# Patient Record
Sex: Male | Born: 1955 | Race: White | Hispanic: No | Marital: Married | Smoking: Current every day smoker
Health system: Southern US, Community
[De-identification: ages and names within clinical notes are randomized; demographics above are authoritative.]

## PROBLEM LIST (undated history)

## (undated) DIAGNOSIS — E119 Type 2 diabetes mellitus without complications: Secondary | ICD-10-CM

## (undated) DIAGNOSIS — E079 Disorder of thyroid, unspecified: Secondary | ICD-10-CM

## (undated) DIAGNOSIS — E291 Testicular hypofunction: Secondary | ICD-10-CM

## (undated) DIAGNOSIS — R809 Proteinuria, unspecified: Secondary | ICD-10-CM

## (undated) DIAGNOSIS — I10 Essential (primary) hypertension: Secondary | ICD-10-CM

## (undated) DIAGNOSIS — E785 Hyperlipidemia, unspecified: Secondary | ICD-10-CM

## (undated) DIAGNOSIS — N529 Male erectile dysfunction, unspecified: Secondary | ICD-10-CM

## (undated) DIAGNOSIS — C801 Malignant (primary) neoplasm, unspecified: Secondary | ICD-10-CM

## (undated) DIAGNOSIS — N189 Chronic kidney disease, unspecified: Secondary | ICD-10-CM

## (undated) DIAGNOSIS — E039 Hypothyroidism, unspecified: Secondary | ICD-10-CM

## (undated) DIAGNOSIS — G4730 Sleep apnea, unspecified: Secondary | ICD-10-CM

## (undated) DIAGNOSIS — K219 Gastro-esophageal reflux disease without esophagitis: Secondary | ICD-10-CM

## (undated) HISTORY — DX: Proteinuria, unspecified: R80.9

## (undated) HISTORY — DX: Male erectile dysfunction, unspecified: N52.9

## (undated) HISTORY — DX: Hyperlipidemia, unspecified: E78.5

## (undated) HISTORY — PX: FRACTURE SURGERY: SHX138

## (undated) HISTORY — DX: Disorder of thyroid, unspecified: E07.9

## (undated) HISTORY — DX: Essential (primary) hypertension: I10

## (undated) HISTORY — DX: Testicular hypofunction: E29.1

## (undated) HISTORY — PX: TONSILLECTOMY: SUR1361

## (undated) HISTORY — DX: Type 2 diabetes mellitus without complications: E11.9

## (undated) HISTORY — PX: TONSILLECTOMY AND ADENOIDECTOMY: SHX28

## (undated) HISTORY — PX: HEMORRHOID SURGERY: SHX153

## (undated) HISTORY — DX: Chronic kidney disease, unspecified: N18.9

## (undated) HISTORY — PX: CLAVICLE SURGERY: SHX598

---

## 2006-01-02 ENCOUNTER — Ambulatory Visit: Payer: Self-pay | Admitting: Surgery

## 2006-02-06 ENCOUNTER — Other Ambulatory Visit: Payer: Self-pay

## 2006-02-10 ENCOUNTER — Ambulatory Visit: Payer: Self-pay | Admitting: Surgery

## 2006-02-11 ENCOUNTER — Emergency Department: Payer: Self-pay | Admitting: Emergency Medicine

## 2012-02-07 ENCOUNTER — Ambulatory Visit: Payer: Self-pay | Admitting: Gastroenterology

## 2014-11-08 ENCOUNTER — Emergency Department: Payer: Self-pay | Admitting: Emergency Medicine

## 2014-11-08 LAB — CBC WITH DIFFERENTIAL/PLATELET
Basophil #: 0 10*3/uL (ref 0.0–0.1)
Basophil %: 0.2 %
EOS ABS: 0.1 10*3/uL (ref 0.0–0.7)
Eosinophil %: 0.3 %
HCT: 41.3 % (ref 40.0–52.0)
HGB: 14 g/dL (ref 13.0–18.0)
LYMPHS ABS: 1 10*3/uL (ref 1.0–3.6)
Lymphocyte %: 6.3 %
MCH: 29.1 pg (ref 26.0–34.0)
MCHC: 33.9 g/dL (ref 32.0–36.0)
MCV: 86 fL (ref 80–100)
MONO ABS: 1 x10 3/mm (ref 0.2–1.0)
Monocyte %: 6.3 %
NEUTROS PCT: 86.9 %
Neutrophil #: 14.3 10*3/uL — ABNORMAL HIGH (ref 1.4–6.5)
Platelet: 178 10*3/uL (ref 150–440)
RBC: 4.82 10*6/uL (ref 4.40–5.90)
RDW: 13.2 % (ref 11.5–14.5)
WBC: 16.5 10*3/uL — ABNORMAL HIGH (ref 3.8–10.6)

## 2014-11-08 LAB — BASIC METABOLIC PANEL
Anion Gap: 6 — ABNORMAL LOW (ref 7–16)
BUN: 16 mg/dL (ref 7–18)
CREATININE: 1.31 mg/dL — AB (ref 0.60–1.30)
Calcium, Total: 9 mg/dL (ref 8.5–10.1)
Chloride: 103 mmol/L (ref 98–107)
Co2: 29 mmol/L (ref 21–32)
EGFR (Non-African Amer.): >60
GLUCOSE: 195 mg/dL — AB (ref 65–99)
Osmolality: 282 (ref 275–301)
POTASSIUM: 3.5 mmol/L (ref 3.5–5.1)
SODIUM: 138 mmol/L (ref 136–145)

## 2014-11-09 DIAGNOSIS — S42102A Fracture of unspecified part of scapula, left shoulder, initial encounter for closed fracture: Secondary | ICD-10-CM | POA: Insufficient documentation

## 2014-11-09 DIAGNOSIS — S42022A Displaced fracture of shaft of left clavicle, initial encounter for closed fracture: Secondary | ICD-10-CM | POA: Insufficient documentation

## 2014-11-09 DIAGNOSIS — S2242XA Multiple fractures of ribs, left side, initial encounter for closed fracture: Secondary | ICD-10-CM | POA: Insufficient documentation

## 2015-02-13 ENCOUNTER — Ambulatory Visit: Payer: Self-pay | Admitting: Gastroenterology

## 2015-04-13 LAB — SURGICAL PATHOLOGY

## 2015-08-03 ENCOUNTER — Ambulatory Visit (INDEPENDENT_AMBULATORY_CARE_PROVIDER_SITE_OTHER): Payer: Managed Care, Other (non HMO) | Admitting: Family Medicine

## 2015-08-03 ENCOUNTER — Encounter: Payer: Self-pay | Admitting: Family Medicine

## 2015-08-03 VITALS — BP 127/78 | HR 63 | Temp 97.2°F | Ht 73.4 in | Wt 227.0 lb

## 2015-08-03 DIAGNOSIS — N182 Chronic kidney disease, stage 2 (mild): Secondary | ICD-10-CM | POA: Insufficient documentation

## 2015-08-03 DIAGNOSIS — Z9989 Dependence on other enabling machines and devices: Secondary | ICD-10-CM

## 2015-08-03 DIAGNOSIS — E291 Testicular hypofunction: Secondary | ICD-10-CM | POA: Insufficient documentation

## 2015-08-03 DIAGNOSIS — K5790 Diverticulosis of intestine, part unspecified, without perforation or abscess without bleeding: Secondary | ICD-10-CM | POA: Insufficient documentation

## 2015-08-03 DIAGNOSIS — E785 Hyperlipidemia, unspecified: Secondary | ICD-10-CM | POA: Diagnosis not present

## 2015-08-03 DIAGNOSIS — E0840 Diabetes mellitus due to underlying condition with diabetic neuropathy, unspecified: Secondary | ICD-10-CM | POA: Insufficient documentation

## 2015-08-03 DIAGNOSIS — E039 Hypothyroidism, unspecified: Secondary | ICD-10-CM | POA: Insufficient documentation

## 2015-08-03 DIAGNOSIS — R809 Proteinuria, unspecified: Secondary | ICD-10-CM | POA: Insufficient documentation

## 2015-08-03 DIAGNOSIS — G4733 Obstructive sleep apnea (adult) (pediatric): Secondary | ICD-10-CM | POA: Insufficient documentation

## 2015-08-03 DIAGNOSIS — N529 Male erectile dysfunction, unspecified: Secondary | ICD-10-CM | POA: Insufficient documentation

## 2015-08-03 DIAGNOSIS — I10 Essential (primary) hypertension: Secondary | ICD-10-CM | POA: Diagnosis not present

## 2015-08-03 DIAGNOSIS — I129 Hypertensive chronic kidney disease with stage 1 through stage 4 chronic kidney disease, or unspecified chronic kidney disease: Secondary | ICD-10-CM | POA: Diagnosis not present

## 2015-08-03 DIAGNOSIS — N2581 Secondary hyperparathyroidism of renal origin: Secondary | ICD-10-CM | POA: Insufficient documentation

## 2015-08-03 LAB — UA/M W/RFLX CULTURE, ROUTINE
Bilirubin, UA: NEGATIVE
Ketones, UA: NEGATIVE
LEUKOCYTES UA: NEGATIVE
Nitrite, UA: NEGATIVE
PH UA: 5.5 (ref 5.0–7.5)
RBC, UA: NEGATIVE
SPEC GRAV UA: 1.03 (ref 1.005–1.030)
Urobilinogen, Ur: 0.2 mg/dL (ref 0.2–1.0)

## 2015-08-03 LAB — MICROSCOPIC EXAMINATION: Epithelial Cells (non renal): NONE SEEN /hpf (ref 0–10)

## 2015-08-03 LAB — BAYER DCA HB A1C WAIVED: HB A1C: 7.3 % — AB (ref <7.0)

## 2015-08-03 LAB — MICROALBUMIN, URINE WAIVED
Creatinine, Urine Waived: 200 mg/dL (ref 10–300)
MICROALB, UR WAIVED: 80 mg/L — AB (ref 0–19)

## 2015-08-03 MED ORDER — AMLODIPINE BESYLATE 10 MG PO TABS: 10.0000 mg | ORAL_TABLET | Freq: Every day | ORAL | Status: DC

## 2015-08-03 MED ORDER — HYDROCHLOROTHIAZIDE 25 MG PO TABS: 25.0000 mg | ORAL_TABLET | Freq: Every day | ORAL | Status: DC

## 2015-08-03 MED ORDER — ROSUVASTATIN CALCIUM 10 MG PO TABS: 10.0000 mg | ORAL_TABLET | Freq: Every day | ORAL | Status: DC

## 2015-08-03 MED ORDER — LOSARTAN POTASSIUM 25 MG PO TABS: 12.5000 mg | ORAL_TABLET | Freq: Every day | ORAL | Status: DC

## 2015-08-03 MED ORDER — JANUMET XR 50-1000 MG PO TB24: 2.0000 | ORAL_TABLET | Freq: Every day | ORAL | Status: DC

## 2015-08-03 MED ORDER — FENOFIBRIC ACID 135 MG PO CPDR: 135.0000 mg | DELAYED_RELEASE_CAPSULE | Freq: Every morning | ORAL | Status: DC

## 2015-08-03 NOTE — Progress Notes (Signed)
BP 127/78 mmHg  Pulse 63  Temp(Src) 97.2 F (36.2 C)  Ht 6' 1.4" (1.864 m)  Wt 227 lb (102.967 kg)  BMI 29.64 kg/m2  SpO2 97%   Subjective:    Patient ID: Manuel Burnett, male    DOB: [birth year], 59 y.o.   MRN: [id number]  HPI: Manuel Burnett is a 59 y.o. male  Chief Complaint  Patient presents with  . Diabetes  . Hypertension  . Hyperlipidemia   DIABETES- has been concerned. His fasting blood sugars have been running too high Hypoglycemic episodes:no Polydipsia/polyuria: yes Visual disturbance: no Chest pain: no Paresthesias: yes Glucose Monitoring: yes  Accucheck frequency: Daily  Fasting glucose: 150s-167 Taking Insulin?: no Blood Pressure Monitoring: not checking Retinal Examination: Up to Date Foot Exam: Up to Date Diabetic Education: Completed Pneumovax: Refused Influenza: Up to Date Aspirin: yes  HYPERTENSION / HYPERLIPIDEMIA Satisfied with current treatment? yes Duration of hypertension: chronic BP monitoring frequency: not checking BP medication side effects: no Duration of hyperlipidemia: chronic Cholesterol medication side effects: no Cholesterol supplements: none Medication compliance: excellent compliance Aspirin: yes Recent stressors: no Recurrent headaches: no Visual changes: no Palpitations: no Dyspnea: no Chest pain: no Lower extremity edema: no Dizzy/lightheaded: no  Relevant past medical, surgical, family and social history reviewed and updated as indicated. Interim medical history since our last visit reviewed. Allergies and medications reviewed and updated.  Review of Systems  Constitutional: Negative.   Respiratory: Negative.   Cardiovascular: Negative.   Gastrointestinal: Negative.   Psychiatric/Behavioral: Negative.     Per HPI unless specifically indicated above     Objective:    BP 127/78 mmHg  Pulse 63  Temp(Src) 97.2 F (36.2 C)  Ht 6' 1.4" (1.864 m)  Wt 227 lb (102.967 kg)  BMI 29.64 kg/m2  SpO2 97%  Wt  Readings from Last 3 Encounters:  08/03/15 227 lb (102.967 kg)  04/27/15 232 lb (105.235 kg)    Physical Exam  Constitutional: He is oriented to person, place, and time. He appears well-developed and well-nourished. No distress.  HENT:  Head: Normocephalic and atraumatic.  Right Ear: Hearing normal.  Left Ear: Hearing normal.  Nose: Nose normal.  Eyes: Conjunctivae and lids are normal. Right eye exhibits no discharge. Left eye exhibits no discharge. No scleral icterus.  Cardiovascular: Normal rate, regular rhythm and normal heart sounds.  Exam reveals no gallop and no friction rub.   No murmur heard. Pulmonary/Chest: Effort normal and breath sounds normal. No respiratory distress. He has no wheezes. He has no rales. He exhibits no tenderness.  Musculoskeletal: Normal range of motion.  Neurological: He is alert and oriented to person, place, and time.  Skin: Skin is warm, dry and intact. No rash noted. No erythema. No pallor.  Psychiatric: He has a normal mood and affect. His speech is normal and behavior is normal. Judgment and thought content normal. Cognition and memory are normal.  Nursing note and vitals reviewed.     Assessment & Plan:   Problem List Items Addressed This Visit      Endocrine   Diabetes mellitus due to underlying condition with diabetic neuropathy    Has been drinking a lot more sweet tea. A1c going in wrong direction. Will not change meds, will cut out sweet tea. Will recheck in 3 months. If still high, consider victoza or other injectable.       Relevant Medications   rosuvastatin (CRESTOR) 10 MG tablet   JANUMET XR 50-1000 MG TB24   losartan (  COZAAR) 25 MG tablet   Other Relevant Orders   Bayer DCA Hb A1c Waived   Comprehensive metabolic panel     Genitourinary   Benign hypertensive renal disease - Primary    Under good control at this time, but + microalbumin. Will add 12.5mg  of losartan for renal protection. Recheck BP to make sure he's not going too  low in 1 month.       Relevant Orders   Comprehensive metabolic panel   Microalbumin, Urine Waived   UA/M w/rflx Culture, Routine   Basic metabolic panel   Chronic kidney disease, stage II (mild)     Other   Hyperlipemia    Lipemic today. Await final results. Did have creamer in his coffee- if only triglycerides elevated, will recheck next visit without coffee with creamer.      Relevant Medications   rosuvastatin (CRESTOR) 10 MG tablet   amLODipine (NORVASC) 10 MG tablet   Choline Fenofibrate (FENOFIBRIC ACID) 135 MG CPDR   hydrochlorothiazide (HYDRODIURIL) 25 MG tablet   losartan (COZAAR) 25 MG tablet   Other Relevant Orders   Comprehensive metabolic panel   Lipid Panel Piccolo, Waived   Proteinuria    Other Visit Diagnoses    Benign hypertension        Relevant Medications    rosuvastatin (CRESTOR) 10 MG tablet    amLODipine (NORVASC) 10 MG tablet    Choline Fenofibrate (FENOFIBRIC ACID) 135 MG CPDR    hydrochlorothiazide (HYDRODIURIL) 25 MG tablet    losartan (COZAAR) 25 MG tablet    Other Relevant Orders    Comprehensive metabolic panel    UA/M w/rflx Culture, Routine        Follow up plan: Return in about 4 weeks (around 08/31/2015) for BP check and 3 months for DM check.

## 2015-08-03 NOTE — Assessment & Plan Note (Signed)
Has been drinking a lot more sweet tea. A1c going in wrong direction. Will not change meds, will cut out sweet tea. Will recheck in 3 months. If still high, consider victoza or other injectable.

## 2015-08-03 NOTE — Assessment & Plan Note (Addendum)
Under good control at this time, but + microalbumin. Will add 12.5mg  of losartan for renal protection. Recheck BP to make sure he's not going too low in 1 month.

## 2015-08-03 NOTE — Patient Instructions (Signed)
Diabetes and Exercise Exercising regularly is important. It is not just about losing weight. It has many health benefits, such as:  Improving your overall fitness, flexibility, and endurance.  Increasing your bone density.  Helping with weight control.  Decreasing your body fat.  Increasing your muscle strength.  Reducing stress and tension.  Improving your overall health. People with diabetes who exercise gain additional benefits because exercise:  Reduces appetite.  Improves the body's use of blood sugar (glucose).  Helps lower or control blood glucose.  Decreases blood pressure.  Helps control blood lipids (such as cholesterol and triglycerides).  Improves the body's use of the hormone insulin by:  Increasing the body's insulin sensitivity.  Reducing the body's insulin needs.  Decreases the risk for heart disease because exercising:  Lowers cholesterol and triglycerides levels.  Increases the levels of good cholesterol (such as high-density lipoproteins [HDL]) in the body.  Lowers blood glucose levels. YOUR ACTIVITY PLAN  Choose an activity that you enjoy and set realistic goals. Your health care provider or diabetes educator can help you make an activity plan that works for you. Exercise regularly as directed by your health care provider. This includes:  Performing resistance training twice a week such as push-ups, sit-ups, lifting weights, or using resistance bands.  Performing 150 minutes of cardio exercises each week such as walking, running, or playing sports.  Staying active and spending no more than 90 minutes at one time being inactive. Even short bursts of exercise are good for you. Three 10-minute sessions spread throughout the day are just as beneficial as a single 30-minute session. Some exercise ideas include:  Taking the dog for a walk.  Taking the stairs instead of the elevator.  Dancing to your favorite song.  Doing an exercise  video.  Doing your favorite exercise with a friend. RECOMMENDATIONS FOR EXERCISING WITH TYPE 1 OR TYPE 2 DIABETES   Check your blood glucose before exercising. If blood glucose levels are greater than 240 mg/dL, check for urine ketones. Do not exercise if ketones are present.  Avoid injecting insulin into areas of the body that are going to be exercised. For example, avoid injecting insulin into:  The arms when playing tennis.  The legs when jogging.  Keep a record of:  Food intake before and after you exercise.  Expected peak times of insulin action.  Blood glucose levels before and after you exercise.  The type and amount of exercise you have done.  Review your records with your health care provider. Your health care provider will help you to develop guidelines for adjusting food intake and insulin amounts before and after exercising.  If you take insulin or oral hypoglycemic agents, watch for signs and symptoms of hypoglycemia. They include:  Dizziness.  Shaking.  Sweating.  Chills.  Confusion.  Drink plenty of water while you exercise to prevent dehydration or heat stroke. Body water is lost during exercise and must be replaced.  Talk to your health care provider before starting an exercise program to make sure it is safe for you. Remember, almost any type of activity is better than none. Document Released: 02/25/2004 Document Revised: 04/21/2014 Document Reviewed: 05/14/2013 Exit[employer] Patient Information 2015 [employer]. This information is not intended to replace advice given to you by your health care provider. Make sure you discuss any questions you have with your health care provider.  

## 2015-08-03 NOTE — Assessment & Plan Note (Signed)
Lipemic today. Await final results. Did have creamer in his coffee- if only triglycerides elevated, will recheck next visit without coffee with creamer.

## 2015-08-04 ENCOUNTER — Encounter: Payer: Self-pay | Admitting: Family Medicine

## 2015-08-04 ENCOUNTER — Telehealth: Payer: Self-pay | Admitting: Family Medicine

## 2015-08-04 LAB — LIPID PANEL W/O CHOL/HDL RATIO
Cholesterol, Total: 169 mg/dL (ref 100–199)
HDL: 32 mg/dL — ABNORMAL LOW (ref >39)
Triglycerides: 478 mg/dL — ABNORMAL HIGH (ref 0–149)

## 2015-08-04 LAB — LIPID PANEL PICCOLO, WAIVED

## 2015-08-04 LAB — COMPREHENSIVE METABOLIC PANEL
ALT: 16 IU/L (ref 0–44)
AST: 13 IU/L (ref 0–40)
Albumin/Globulin Ratio: 1.7 (ref 1.1–2.5)
Albumin: 4.5 g/dL (ref 3.5–5.5)
Alkaline Phosphatase: 52 IU/L (ref 39–117)
BUN/Creatinine Ratio: 16 (ref 9–20)
BUN: 19 mg/dL (ref 6–24)
Bilirubin Total: 0.3 mg/dL (ref 0.0–1.2)
CHLORIDE: 98 mmol/L (ref 97–108)
CO2: 25 mmol/L (ref 18–29)
Calcium: 9.2 mg/dL (ref 8.7–10.2)
Creatinine, Ser: 1.17 mg/dL (ref 0.76–1.27)
GFR calc non Af Amer: 69 mL/min/{1.73_m2} (ref >59)
GFR, EST AFRICAN AMERICAN: 80 mL/min/{1.73_m2} (ref >59)
GLUCOSE: 185 mg/dL — AB (ref 65–99)
Globulin, Total: 2.7 g/dL (ref 1.5–4.5)
Potassium: 3.8 mmol/L (ref 3.5–5.2)
Sodium: 139 mmol/L (ref 134–144)
TOTAL PROTEIN: 7.2 g/dL (ref 6.0–8.5)

## 2015-08-04 MED ORDER — FENOFIBRATE 160 MG PO TABS
160.0000 mg | ORAL_TABLET | Freq: Every day | ORAL | Status: DC
Start: 2015-08-04 — End: 2016-01-19

## 2015-08-04 NOTE — Telephone Encounter (Signed)
Called and left message on machine. Triglycerides very high. Will increase dose of fenofibrate.

## 2015-09-07 ENCOUNTER — Ambulatory Visit: Payer: Managed Care, Other (non HMO) | Admitting: Family Medicine

## 2015-09-11 ENCOUNTER — Encounter: Payer: Self-pay | Admitting: Family Medicine

## 2015-09-11 ENCOUNTER — Ambulatory Visit (INDEPENDENT_AMBULATORY_CARE_PROVIDER_SITE_OTHER): Payer: Managed Care, Other (non HMO) | Admitting: Family Medicine

## 2015-09-11 VITALS — BP 128/78 | HR 64 | Temp 97.5°F | Ht 72.3 in | Wt 222.0 lb

## 2015-09-11 DIAGNOSIS — I129 Hypertensive chronic kidney disease with stage 1 through stage 4 chronic kidney disease, or unspecified chronic kidney disease: Secondary | ICD-10-CM

## 2015-09-11 DIAGNOSIS — E0840 Diabetes mellitus due to underlying condition with diabetic neuropathy, unspecified: Secondary | ICD-10-CM

## 2015-09-11 DIAGNOSIS — E291 Testicular hypofunction: Secondary | ICD-10-CM | POA: Diagnosis not present

## 2015-09-11 MED ORDER — DULAGLUTIDE 0.75 MG/0.5ML ~~LOC~~ SOAJ: 0.7500 mg | SUBCUTANEOUS | Status: DC

## 2015-09-11 MED ORDER — DULAGLUTIDE 0.75 MG/0.5ML ~~LOC~~ SOAJ: 0.5700 mg | SUBCUTANEOUS | Status: DC

## 2015-09-11 MED ORDER — TESTOSTERONE CYPIONATE 200 MG/ML IM SOLN: 100.0000 mg | INTRAMUSCULAR | Status: DC

## 2015-09-11 NOTE — Assessment & Plan Note (Signed)
Not happy with how his numbers are looking. Will start trulicity as he is already on max dose januvia and metformin. Injection discussed and he demonstrated it in the office today. Will follow up with repeat A1c in 2 months. Recheck on side effects in 1 month.

## 2015-09-11 NOTE — Assessment & Plan Note (Signed)
Under good control today. Checking CMP for losartan safety and testosterone safety. Continue current regimen. Continue to monitor.

## 2015-09-11 NOTE — Assessment & Plan Note (Signed)
Stable. Will recheck levels today and will refill rx. Rx given. Checking CMP to follow up on LFTs.

## 2015-09-11 NOTE — Progress Notes (Signed)
BP 128/78 mmHg  Pulse 64  Temp(Src) 97.5 F (36.4 C)  Ht 6' 0.3" (1.836 m)  Wt 222 lb (100.699 kg)  BMI 29.87 kg/m2  SpO2 97%   Subjective:    Patient ID: Manuel Burnett, male    DOB: 1956/11/02, 59 y.o.   MRN: [id number]  HPI: ESTANISLADO SURGEON is a 59 y.o. male  Chief Complaint  Patient presents with  . Diabetes  . Hypogonadism  . Hypertension   HYPERTENSION Hypertension status: controlled  Satisfied with current treatment? yes Duration of hypertension: chronic BP monitoring frequency:  not checking BP medication side effects:  no Medication compliance: excellent compliance Aspirin: no Recurrent headaches: no Visual changes: no Palpitations: no Dyspnea: no Chest pain: no Lower extremity edema: no Dizzy/lightheaded: no  DIABETES- has cut out the sweet tea and still having high sugars. Starting the day at 150 and goes up from there. Not happy about that.  Hypoglycemic episodes:no Polydipsia/polyuria: yes Visual disturbance: no Chest pain: no Paresthesias: no Glucose Monitoring: yes  Accucheck frequency: BID Retinal Examination: Up to Date Foot Exam: Not up to Date Diabetic Education: Completed Aspirin: yes  LOW TESTOSTERONE- took on Sunday Duration: chronic Status: stable Satisfied with current treatment: yes Medication side effects: no Medication compliance: excellent Fatigue: no Depressed mood: no Muscle weakness: no Erectile dysfunction: no  Relevant past medical, surgical, family and social history reviewed and updated as indicated. Interim medical history since our last visit reviewed. Allergies and medications reviewed and updated.  Review of Systems  Constitutional: Negative.   HENT: Negative.   Respiratory: Negative.   Cardiovascular: Negative.   Gastrointestinal: Negative.   Psychiatric/Behavioral: Negative.     Per HPI unless specifically indicated above     Objective:    BP 128/78 mmHg  Pulse 64  Temp(Src) 97.5 F (36.4 C)   Ht 6' 0.3" (1.836 m)  Wt 222 lb (100.699 kg)  BMI 29.87 kg/m2  SpO2 97%  Wt Readings from Last 3 Encounters:  09/11/15 222 lb (100.699 kg)  08/03/15 227 lb (102.967 kg)  04/27/15 232 lb (105.235 kg)    Physical Exam  Constitutional: He is oriented to person, place, and time. He appears well-developed and well-nourished. No distress.  HENT:  Head: Normocephalic and atraumatic.  Right Ear: Hearing normal.  Left Ear: Hearing normal.  Nose: Nose normal.  Eyes: Conjunctivae and lids are normal. Right eye exhibits no discharge. Left eye exhibits no discharge. No scleral icterus.  Cardiovascular: Normal rate, regular rhythm, normal heart sounds and intact distal pulses.  Exam reveals no gallop and no friction rub.   No murmur heard. Pulmonary/Chest: Effort normal and breath sounds normal. No respiratory distress. He has no wheezes. He has no rales. He exhibits no tenderness.  Musculoskeletal: Normal range of motion.  Neurological: He is alert and oriented to person, place, and time.  Skin: Skin is warm, dry and intact. No rash noted. No erythema. No pallor.  Psychiatric: He has a normal mood and affect. His speech is normal and behavior is normal. Judgment and thought content normal. Cognition and memory are normal.  Nursing note and vitals reviewed.   Results for orders placed or performed in visit on 08/03/15  Microscopic Examination  Result Value Ref Range   WBC, UA 0-5 0 -  5 /hpf   RBC, UA 0-2 0 -  2 /hpf   Epithelial Cells (non renal) None seen 0 - 10 /hpf   Mucus, UA Present Not Estab.   Bacteria, UA  Few None seen/Few  Bayer DCA Hb A1c Waived  Result Value Ref Range   Bayer DCA Hb A1c Waived 7.3 (H) <7.0 %  Comprehensive metabolic panel  Result Value Ref Range   Glucose 185 (H) 65 - 99 mg/dL   BUN 19 6 - 24 mg/dL   Creatinine, Ser 1.17 0.76 - 1.27 mg/dL   GFR calc non Af Amer 69 >59 mL/min/1.73   GFR calc Af Amer 80 >59 mL/min/1.73   BUN/Creatinine Ratio 16 9 - 20    Sodium 139 134 - 144 mmol/L   Potassium 3.8 3.5 - 5.2 mmol/L   Chloride 98 97 - 108 mmol/L   CO2 25 18 - 29 mmol/L   Calcium 9.2 8.7 - 10.2 mg/dL   Total Protein 7.2 6.0 - 8.5 g/dL   Albumin 4.5 3.5 - 5.5 g/dL   Globulin, Total 2.7 1.5 - 4.5 g/dL   Albumin/Globulin Ratio 1.7 1.1 - 2.5   Bilirubin Total 0.3 0.0 - 1.2 mg/dL   Alkaline Phosphatase 52 39 - 117 IU/L   AST 13 0 - 40 IU/L   ALT 16 0 - 44 IU/L  Microalbumin, Urine Waived  Result Value Ref Range   Microalb, Ur Waived 80 (H) 0 - 19 mg/L   Creatinine, Urine Waived 200 10 - 300 mg/dL   Microalb/Creat Ratio 30-300 (H) <30 mg/g  Lipid Panel Piccolo, Waived  Result Value Ref Range   Cholesterol Piccolo, Waived CANCELED mg/dL   HDL Chol Piccolo, Waived CANCELED    Triglycerides Piccolo,Waived CANCELED   UA/M w/rflx Culture, Routine  Result Value Ref Range   Specific Gravity, UA 1.030 1.005 - 1.030   pH, UA 5.5 5.0 - 7.5   Color, UA Yellow Yellow   Appearance Ur Clear Clear   Leukocytes, UA Negative Negative   Protein, UA 1+ (A) Negative/Trace   Glucose, UA 3+ (A) Negative   Ketones, UA Negative Negative   RBC, UA Negative Negative   Bilirubin, UA Negative Negative   Urobilinogen, Ur 0.2 0.2 - 1.0 mg/dL   Nitrite, UA Negative Negative   Microscopic Examination See below:   Lipid Panel w/o Chol/HDL Ratio  Result Value Ref Range   Cholesterol, Total 169 100 - 199 mg/dL   Triglycerides 478 (H) 0 - 149 mg/dL   HDL 32 (L) >39 mg/dL   VLDL Cholesterol Cal Comment 5 - 40 mg/dL   LDL Calculated Comment 0 - 99 mg/dL      Assessment & Plan:   Problem List Items Addressed This Visit      Endocrine   Diabetes mellitus due to underlying condition with diabetic neuropathy - Primary    Not happy with how his numbers are looking. Will start trulicity as he is already on max dose januvia and metformin. Injection discussed and he demonstrated it in the office today. Will follow up with repeat A1c in 2 months. Recheck on side  effects in 1 month.       Relevant Medications   Dulaglutide (TRULICITY) 3.23 FT/7.3UK SOPN   Other Relevant Orders   Comprehensive metabolic panel   Male hypogonadism    Stable. Will recheck levels today and will refill rx. Rx given. Checking CMP to follow up on LFTs.       Relevant Orders   Testosterone, free, total     Genitourinary   Benign hypertensive renal disease    Under good control today. Checking CMP for losartan safety and testosterone safety. Continue current regimen. Continue to monitor.  Relevant Orders   Comprehensive metabolic panel       Follow up plan: Return in about 4 weeks (around 10/09/2015).

## 2015-09-12 LAB — COMPREHENSIVE METABOLIC PANEL
A/G RATIO: 1.9 (ref 1.1–2.5)
ALT: 15 IU/L (ref 0–44)
AST: 15 IU/L (ref 0–40)
Albumin: 4.6 g/dL (ref 3.5–5.5)
Alkaline Phosphatase: 46 IU/L (ref 39–117)
BUN/Creatinine Ratio: 13 (ref 9–20)
BUN: 15 mg/dL (ref 6–24)
Bilirubin Total: 0.5 mg/dL (ref 0.0–1.2)
CALCIUM: 9.8 mg/dL (ref 8.7–10.2)
CO2: 26 mmol/L (ref 18–29)
Chloride: 100 mmol/L (ref 97–108)
Creatinine, Ser: 1.17 mg/dL (ref 0.76–1.27)
GFR, EST AFRICAN AMERICAN: 80 mL/min/{1.73_m2} (ref >59)
GFR, EST NON AFRICAN AMERICAN: 69 mL/min/{1.73_m2} (ref >59)
GLOBULIN, TOTAL: 2.4 g/dL (ref 1.5–4.5)
Glucose: 148 mg/dL — ABNORMAL HIGH (ref 65–99)
POTASSIUM: 3.6 mmol/L (ref 3.5–5.2)
SODIUM: 141 mmol/L (ref 134–144)
Total Protein: 7 g/dL (ref 6.0–8.5)

## 2015-09-13 LAB — TESTOSTERONE, FREE, TOTAL, SHBG: Testosterone, Free: 15.8 pg/mL (ref 7.2–24.0)

## 2015-09-14 ENCOUNTER — Encounter: Payer: Self-pay | Admitting: Family Medicine

## 2015-09-18 ENCOUNTER — Telehealth: Payer: Self-pay | Admitting: Family Medicine

## 2015-09-18 MED ORDER — GLUCOSE BLOOD VI STRP
ORAL_STRIP | Status: DC
Start: 2015-09-18 — End: 2016-08-08

## 2015-09-18 NOTE — Telephone Encounter (Signed)
Pt needs one touch test ultra strips rx sent over to [facility]

## 2015-09-18 NOTE — Telephone Encounter (Signed)
Rx sent to his pharmacy

## 2015-09-18 NOTE — Telephone Encounter (Signed)
Forward to provider

## 2015-10-12 ENCOUNTER — Ambulatory Visit (INDEPENDENT_AMBULATORY_CARE_PROVIDER_SITE_OTHER): Payer: Managed Care, Other (non HMO) | Admitting: Family Medicine

## 2015-10-12 ENCOUNTER — Encounter: Payer: Self-pay | Admitting: Family Medicine

## 2015-10-12 VITALS — BP 114/75 | HR 75 | Temp 98.2°F | Ht 72.0 in | Wt 220.0 lb

## 2015-10-12 DIAGNOSIS — D485 Neoplasm of uncertain behavior of skin: Secondary | ICD-10-CM

## 2015-10-12 DIAGNOSIS — L299 Pruritus, unspecified: Secondary | ICD-10-CM | POA: Diagnosis not present

## 2015-10-12 DIAGNOSIS — E0840 Diabetes mellitus due to underlying condition with diabetic neuropathy, unspecified: Secondary | ICD-10-CM

## 2015-10-12 DIAGNOSIS — Z23 Encounter for immunization: Secondary | ICD-10-CM

## 2015-10-12 NOTE — Patient Instructions (Signed)
Skin Biopsy WHY AM I HAVING THIS TEST? This test examines skin tissue under a microscope. Your health care provider may perform this test to identify the source of a skin rash or lesion if an allergy is suspected. In this test, a tissue sample (biopsy) is taken to look at your skin's anatomy and to see if certain proteins and antibodies are present. WHAT KIND OF SAMPLE IS TAKEN? A small sample of skin is required for this test. It is usually collected by numbing an area of skin and removing a small circle of skin. HOW DO I PREPARE FOR THE TEST? There is no preparation required for this test. HOW ARE THE TEST RESULTS REPORTED? Your results may be reported in different ways and are dependent upon the kind of test that is performed. It is your responsibility to obtain your test results. Ask the lab or department performing the test when and how you will get your results. It is most common for your results to be reported as:  Normal skin anatomy (histology).  No evidence of IgG, IgA, or IgM antibody, complement C3, or fibrinogen. WHAT DO THE RESULTS MEAN? Abnormal findings may indicate certain autoimmune diseases. This test can produce many different results. It is important to talk with your health care provider to discuss your results, treatment options, and if necessary, the need for more tests. Talk with your health care provider if you have any questions about your results.   This information is not intended to replace advice given to you by your health care provider. Make sure you discuss any questions you have with your health care provider.   Document Released: 01/06/2005 Document Revised: 12/26/2014 Document Reviewed: 03/04/2015 Elsevier Interactive Patient Education [employer].

## 2015-10-12 NOTE — Assessment & Plan Note (Signed)
Doing well with the trulicity. No major side effects. Continue current regimen. Due for recheck on his A1c in 1 month.

## 2015-10-12 NOTE — Progress Notes (Signed)
BP 114/75 mmHg  Pulse 75  Temp(Src) 98.2 F (36.8 C)  Ht 6' (1.829 m)  Wt 220 lb (99.791 kg)  BMI 29.83 kg/m2  SpO2 97%   Subjective:    Patient ID: Manuel Burnett, male    DOB: 02/22/1956, 59 y.o.   MRN: [id number]  HPI: Manuel Burnett is a 59 y.o. male  Chief Complaint  Patient presents with  . Diabetes    patient was recently started on Trulicity, he is here to check on side effects  . Nevus    right side of neck   DIABETES- has been having some itching  Hypoglycemic episodes:no Polydipsia/polyuria: no Visual disturbance: no Chest pain: no Paresthesias: yes Glucose Monitoring: yes  Accucheck frequency: BID  Fasting glucose: 130s-109 Taking Insulin?: no Blood Pressure Monitoring: not checking Retinal Examination: Going Nov 4th Foot Exam: Up to Date Diabetic Education: Completed Pneumovax: Up to Date Influenza: Given today Aspirin: yes  SKIN LESION Duration: chronic Location: R clavicle Painful: no Itching: no Onset: gradual Context: bigger Associated signs and symptoms: none History of skin cancer: yes History of precancerous skin lesions: yes Family history of skin cancer: no   Relevant past medical, surgical, family and social history reviewed and updated as indicated. Interim medical history since our last visit reviewed. Allergies and medications reviewed and updated.  Review of Systems  Constitutional: Negative.   Respiratory: Negative.   Cardiovascular: Negative.   Musculoskeletal: Negative.   Skin: Negative.   Psychiatric/Behavioral: Negative.     Per HPI unless specifically indicated above     Objective:    BP 114/75 mmHg  Pulse 75  Temp(Src) 98.2 F (36.8 C)  Ht 6' (1.829 m)  Wt 220 lb (99.791 kg)  BMI 29.83 kg/m2  SpO2 97%  Wt Readings from Last 3 Encounters:  10/12/15 220 lb (99.791 kg)  09/11/15 222 lb (100.699 kg)  08/03/15 227 lb (102.967 kg)    Physical Exam  Constitutional: He is oriented to person, place, and  time. He appears well-developed and well-nourished. No distress.  HENT:  Head: Normocephalic and atraumatic.  Right Ear: Hearing normal.  Left Ear: Hearing normal.  Nose: Nose normal.  Eyes: Conjunctivae and lids are normal. Right eye exhibits no discharge. Left eye exhibits no discharge. No scleral icterus.  Cardiovascular: Normal rate, regular rhythm, normal heart sounds and intact distal pulses.  Exam reveals no gallop and no friction rub.   No murmur heard. Pulmonary/Chest: Effort normal. No respiratory distress. He has wheezes. He has no rales. He exhibits no tenderness.  Musculoskeletal: Normal range of motion.  Neurological: He is alert and oriented to person, place, and time.  Skin: Skin is warm, dry and intact. No rash noted. No erythema. No pallor.  34mm raised hyperpigmented nevus on R collar bone with areas of darker pigment within.   Psychiatric: He has a normal mood and affect. His speech is normal and behavior is normal. Judgment and thought content normal. Cognition and memory are normal.  Nursing note and vitals reviewed.   Results for orders placed or performed in visit on 09/11/15  Comprehensive metabolic panel  Result Value Ref Range   Glucose 148 (H) 65 - 99 mg/dL   BUN 15 6 - 24 mg/dL   Creatinine, Ser 1.17 0.76 - 1.27 mg/dL   GFR calc non Af Amer 69 >59 mL/min/1.73   GFR calc Af Amer 80 >59 mL/min/1.73   BUN/Creatinine Ratio 13 9 - 20   Sodium 141 134 - 144  mmol/L   Potassium 3.6 3.5 - 5.2 mmol/L   Chloride 100 97 - 108 mmol/L   CO2 26 18 - 29 mmol/L   Calcium 9.8 8.7 - 10.2 mg/dL   Total Protein 7.0 6.0 - 8.5 g/dL   Albumin 4.6 3.5 - 5.5 g/dL   Globulin, Total 2.4 1.5 - 4.5 g/dL   Albumin/Globulin Ratio 1.9 1.1 - 2.5   Bilirubin Total 0.5 0.0 - 1.2 mg/dL   Alkaline Phosphatase 46 39 - 117 IU/L   AST 15 0 - 40 IU/L   ALT 15 0 - 44 IU/L  Testosterone, free, total  Result Value Ref Range   Testosterone, Free 15.8 7.2 - 24.0 pg/mL      Assessment &  Plan:   Problem List Items Addressed This Visit      Endocrine   Diabetes mellitus due to underlying condition with diabetic neuropathy ([place]) - Primary    Doing well with the trulicity. No major side effects. Continue current regimen. Due for recheck on his A1c in 1 month.        Other Visit Diagnoses    Immunization due        Flu shot given today    Relevant Orders    Flu Vaccine QUAD 36+ mos PF IM (Fluarix & Fluzone Quad PF) (Completed)    Itching        Liklely due to dry skin from change in the weather. Will start moisturizing when he gets out of the shower, will let us know if still bothering him.     Neoplasm of uncertain behavior of skin        Shave biopsy done today. See note below.     Relevant Orders    Pathology Report       Skin Procedure  Procedure: Informed consent given.  Semi-sterile prep of the area.  Area infiltrated with lidocaine with epinephrine.  Using a surgical blade, part of the upper dermis shaved off and sent  for pathology.  Area cauterized. Pt ed on scarring     Diagnosis:   ICD-9-CM ICD-10-CM   1. Diabetes mellitus due to underlying condition with diabetic neuropathy, without long-term current use of insulin ([facility]) 249.60 E08.40    357.2    2. Immunization due V05.9 Z23 Flu Vaccine QUAD 36+ mos PF IM (Fluarix & Fluzone Quad PF)   Flu shot given today  3. Itching 698.9 L29.9    Liklely due to dry skin from change in the weather. Will start moisturizing when he gets out of the shower, will let us know if still bothering him.   4. Neoplasm of uncertain behavior of skin 238.2 D48.5 Pathology Report   Shave biopsy done today. See note below.     Lesion Location/Size: 73mm slightly above R collar bone Physician: MJ Consent:  Risks, benefits, and alternative treatments discussed and all questions were answered.  Patient elected to proceed and verbal consent obtained.  Description: Area prepped and draped using semi-sterile technique. Area locally  anesthetized using 2 cc's of lidocaine 1% with epi. Shave biopsy of lesion performed using a #15 blade scalpel.  Adequate hemostastis achieved using Silver Nitrate  Post Procedure Instructions:  Wound care instructions discussed and patient was instructed to keep area clean and dry.  Signs and symptoms of infection discussed, patient agrees to contact the office ASAP should they occur. Signs and symptoms of infection discussed and patient encouraged to contact the clinic ASAP for concerns.   Follow up plan:  Return in about 4 weeks (around 11/09/2015) for DM visit.

## 2015-10-14 ENCOUNTER — Telehealth: Payer: Self-pay | Admitting: Family Medicine

## 2015-10-14 LAB — PATHOLOGY

## 2015-10-14 NOTE — Telephone Encounter (Signed)
Called to let him know that his mole came back as a SK. No concerns.

## 2015-11-24 ENCOUNTER — Other Ambulatory Visit: Payer: Self-pay | Admitting: Family Medicine

## 2016-01-15 ENCOUNTER — Telehealth: Payer: Self-pay

## 2016-01-15 NOTE — Telephone Encounter (Signed)
Fenofibrate 160 mg one tablet daily

## 2016-01-19 MED ORDER — FENOFIBRATE 160 MG PO TABS: 160.0000 mg | ORAL_TABLET | Freq: Every day | ORAL | Status: DC

## 2016-01-19 NOTE — Telephone Encounter (Signed)
Rx sent to his pharmacy

## 2016-02-02 ENCOUNTER — Encounter: Payer: Self-pay | Admitting: Family Medicine

## 2016-02-02 ENCOUNTER — Ambulatory Visit (INDEPENDENT_AMBULATORY_CARE_PROVIDER_SITE_OTHER): Payer: Managed Care, Other (non HMO) | Admitting: Family Medicine

## 2016-02-02 VITALS — BP 131/71 | HR 65 | Temp 96.7°F | Ht 72.3 in | Wt 217.0 lb

## 2016-02-02 DIAGNOSIS — I129 Hypertensive chronic kidney disease with stage 1 through stage 4 chronic kidney disease, or unspecified chronic kidney disease: Secondary | ICD-10-CM

## 2016-02-02 DIAGNOSIS — Z23 Encounter for immunization: Secondary | ICD-10-CM | POA: Diagnosis not present

## 2016-02-02 DIAGNOSIS — Z0000 Encounter for general adult medical examination without abnormal findings: Secondary | ICD-10-CM

## 2016-02-02 DIAGNOSIS — R809 Proteinuria, unspecified: Secondary | ICD-10-CM

## 2016-02-02 DIAGNOSIS — E291 Testicular hypofunction: Secondary | ICD-10-CM

## 2016-02-02 DIAGNOSIS — E0840 Diabetes mellitus due to underlying condition with diabetic neuropathy, unspecified: Secondary | ICD-10-CM | POA: Diagnosis not present

## 2016-02-02 DIAGNOSIS — Z125 Encounter for screening for malignant neoplasm of prostate: Secondary | ICD-10-CM

## 2016-02-02 DIAGNOSIS — N182 Chronic kidney disease, stage 2 (mild): Secondary | ICD-10-CM

## 2016-02-02 DIAGNOSIS — E039 Hypothyroidism, unspecified: Secondary | ICD-10-CM | POA: Diagnosis not present

## 2016-02-02 DIAGNOSIS — E785 Hyperlipidemia, unspecified: Secondary | ICD-10-CM | POA: Diagnosis not present

## 2016-02-02 LAB — MICROALBUMIN, URINE WAIVED
CREATININE, URINE WAIVED: 200 mg/dL (ref 10–300)
Microalb, Ur Waived: 30 mg/L — ABNORMAL HIGH (ref 0–19)
Microalb/Creat Ratio: <30 mg/g (ref <30)

## 2016-02-02 LAB — UA/M W/RFLX CULTURE, ROUTINE
BILIRUBIN UA: NEGATIVE
Ketones, UA: NEGATIVE
Leukocytes, UA: NEGATIVE
NITRITE UA: NEGATIVE
Protein, UA: NEGATIVE
RBC, UA: NEGATIVE
Specific Gravity, UA: >1.03 — ABNORMAL HIGH (ref 1.005–1.030)
UUROB: 0.2 mg/dL (ref 0.2–1.0)
pH, UA: 6 (ref 5.0–7.5)

## 2016-02-02 LAB — LIPID PANEL PICCOLO, WAIVED
CHOL/HDL RATIO PICCOLO,WAIVE: 3.5 mg/dL
CHOLESTEROL PICCOLO, WAIVED: 141 mg/dL (ref <200)
HDL CHOL PICCOLO, WAIVED: 40 mg/dL — AB (ref >59)
LDL Chol Calc Piccolo Waived: 39 mg/dL (ref <100)
TRIGLYCERIDES PICCOLO,WAIVED: 307 mg/dL — AB (ref <150)
VLDL Chol Calc Piccolo,Waive: 61 mg/dL — ABNORMAL HIGH (ref <30)

## 2016-02-02 LAB — BAYER DCA HB A1C WAIVED: HB A1C: 6.5 % (ref <7.0)

## 2016-02-02 MED ORDER — JANUMET XR 50-1000 MG PO TB24: 2.0000 | ORAL_TABLET | Freq: Every day | ORAL | Status: DC

## 2016-02-02 MED ORDER — LOSARTAN POTASSIUM 25 MG PO TABS: 12.5000 mg | ORAL_TABLET | Freq: Every day | ORAL | Status: DC

## 2016-02-02 MED ORDER — SILDENAFIL CITRATE 100 MG PO TABS: 100.0000 mg | ORAL_TABLET | Freq: Every day | ORAL | Status: DC | PRN

## 2016-02-02 MED ORDER — "BD INTEGRA SYRINGE 22G X 1-1/2"" 3 ML MISC": Status: DC

## 2016-02-02 MED ORDER — ROSUVASTATIN CALCIUM 10 MG PO TABS: 10.0000 mg | ORAL_TABLET | Freq: Every day | ORAL | Status: DC

## 2016-02-02 MED ORDER — "YALE DISP NEEDLES 18G X 1"" MISC": Status: DC

## 2016-02-02 MED ORDER — TESTOSTERONE CYPIONATE 200 MG/ML IM SOLN: 100.0000 mg | INTRAMUSCULAR | Status: DC

## 2016-02-02 MED ORDER — AMLODIPINE BESYLATE 10 MG PO TABS: 10.0000 mg | ORAL_TABLET | Freq: Every day | ORAL | Status: DC

## 2016-02-02 MED ORDER — HYDROCHLOROTHIAZIDE 25 MG PO TABS: 25.0000 mg | ORAL_TABLET | Freq: Every day | ORAL | Status: DC

## 2016-02-02 MED ORDER — DULAGLUTIDE 0.75 MG/0.5ML ~~LOC~~ SOAJ: 0.7500 mg | SUBCUTANEOUS | Status: DC

## 2016-02-02 NOTE — Assessment & Plan Note (Signed)
Under good control. Continue current regimen. Continue to monitor.  

## 2016-02-02 NOTE — Patient Instructions (Addendum)
Health Maintenance, Male A healthy lifestyle and preventative care can promote health and wellness.  Maintain regular health, dental, and eye exams.  Eat a healthy diet. Foods like vegetables, fruits, whole grains, low-fat dairy products, and lean protein foods contain the nutrients you need and are low in calories. Decrease your intake of foods high in solid fats, added sugars, and salt. Get information about a proper diet from your health care provider, if necessary.  Regular physical exercise is one of the most important things you can do for your health. Most adults should get at least 150 minutes of moderate-intensity exercise (any activity that increases your heart rate and causes you to sweat) each week. In addition, most adults need muscle-strengthening exercises on 2 or more days a week.   Maintain a healthy weight. The body mass index (BMI) is a screening tool to identify possible weight problems. It provides an estimate of body fat based on height and weight. Your health care provider can find your BMI and can help you achieve or maintain a healthy weight. For males 20 years and older:  A BMI below 18.5 is considered underweight.  A BMI of 18.5 to 24.9 is normal.  A BMI of 25 to 29.9 is considered overweight.  A BMI of 30 and above is considered obese.  Maintain normal blood lipids and cholesterol by exercising and minimizing your intake of saturated fat. Eat a balanced diet with plenty of fruits and vegetables. Blood tests for lipids and cholesterol should begin at age 20 and be repeated every 5 years. If your lipid or cholesterol levels are high, you are over age 50, or you are at high risk for heart disease, you may need your cholesterol levels checked more frequently.Ongoing high lipid and cholesterol levels should be treated with medicines if diet and exercise are not working.  If you smoke, find out from your health care provider how to quit. If you do not use tobacco, do not  start.  Lung cancer screening is recommended for adults aged 55-80 years who are at high risk for developing lung cancer because of a history of smoking. A yearly low-dose CT scan of the lungs is recommended for people who have at least a 30-pack-year history of smoking and are current smokers or have quit within the past 15 years. A pack year of smoking is smoking an average of 1 pack of cigarettes a day for 1 year (for example, a 30-pack-year history of smoking could mean smoking 1 pack a day for 30 years or 2 packs a day for 15 years). Yearly screening should continue until the smoker has stopped smoking for at least 15 years. Yearly screening should be stopped for people who develop a health problem that would prevent them from having lung cancer treatment.  If you choose to drink alcohol, do not have more than 2 drinks per day. One drink is considered to be 12 oz (360 mL) of beer, 5 oz (150 mL) of wine, or 1.5 oz (45 mL) of liquor.  Avoid the use of street drugs. Do not share needles with anyone. Ask for help if you need support or instructions about stopping the use of drugs.  High blood pressure causes heart disease and increases the risk of stroke. High blood pressure is more likely to develop in:  People who have blood pressure in the end of the normal range (100-139/85-89 mm Hg).  People who are overweight or obese.  People who are African American.    If you are 18-39 years of age, have your blood pressure checked every 3-5 years. If you are 40 years of age or older, have your blood pressure checked every year. You should have your blood pressure measured twice--once when you are at a hospital or clinic, and once when you are not at a hospital or clinic. Record the average of the two measurements. To check your blood pressure when you are not at a hospital or clinic, you can use:  An automated blood pressure machine at a pharmacy.  A home blood pressure monitor.  If you are 45-79 years  old, ask your health care provider if you should take aspirin to prevent heart disease.  Diabetes screening involves taking a blood sample to check your fasting blood sugar level. This should be done once every 3 years after age 45 if you are at a normal weight and without risk factors for diabetes. Testing should be considered at a younger age or be carried out more frequently if you are overweight and have at least 1 risk factor for diabetes.  Colorectal cancer can be detected and often prevented. Most routine colorectal cancer screening begins at the age of 50 and continues through age 75. However, your health care provider may recommend screening at an earlier age if you have risk factors for colon cancer. On a yearly basis, your health care provider may provide home test kits to check for hidden blood in the stool. A small camera at the end of a tube may be used to directly examine the colon (sigmoidoscopy or colonoscopy) to detect the earliest forms of colorectal cancer. Talk to your health care provider about this at age 50 when routine screening begins. A direct exam of the colon should be repeated every 5-10 years through age 75, unless early forms of precancerous polyps or small growths are found.  People who are at an increased risk for hepatitis B should be screened for this virus. You are considered at high risk for hepatitis B if:  You were born in a country where hepatitis B occurs often. Talk with your health care provider about which countries are considered high risk.  Your parents were born in a high-risk country and you have not received a shot to protect against hepatitis B (hepatitis B vaccine).  You have HIV or AIDS.  You use needles to inject street drugs.  You live with, or have sex with, someone who has hepatitis B.  You are a man who has sex with other men (MSM).  You get hemodialysis treatment.  You take certain medicines for conditions like cancer, organ  transplantation, and autoimmune conditions.  Hepatitis C blood testing is recommended for all people born from 1945 through 1965 and any individual with known risk factors for hepatitis C.  Healthy men should no longer receive prostate-specific antigen (PSA) blood tests as part of routine cancer screening. Talk to your health care provider about prostate cancer screening.  Testicular cancer screening is not recommended for adolescents or adult males who have no symptoms. Screening includes self-exam, a health care provider exam, and other screening tests. Consult with your health care provider about any symptoms you have or any concerns you have about testicular cancer.  Practice safe sex. Use condoms and avoid high-risk sexual practices to reduce the spread of sexually transmitted infections (STIs).  You should be screened for STIs, including gonorrhea and chlamydia if:  You are sexually active and are younger than 24 years.  You   are older than 24 years, and your health care provider tells you that you are at risk for this type of infection.  Your sexual activity has changed since you were last screened, and you are at an increased risk for chlamydia or gonorrhea. Ask your health care provider if you are at risk.  If you are at risk of being infected with HIV, it is recommended that you take a prescription medicine daily to prevent HIV infection. This is called pre-exposure prophylaxis (PrEP). You are considered at risk if:  You are a man who has sex with other men (MSM).  You are a heterosexual man who is sexually active with multiple partners.  You take drugs by injection.  You are sexually active with a partner who has HIV.  Talk with your health care provider about whether you are at high risk of being infected with HIV. If you choose to begin PrEP, you should first be tested for HIV. You should then be tested every 3 months for as long as you are taking PrEP.  Use sunscreen. Apply  sunscreen liberally and repeatedly throughout the day. You should seek shade when your shadow is shorter than you. Protect yourself by wearing long sleeves, pants, a wide-brimmed hat, and sunglasses year round whenever you are outdoors.  Tell your health care provider of new moles or changes in moles, especially if there is a change in shape or color. Also, tell your health care provider if a mole is larger than the size of a pencil eraser.  A one-time screening for abdominal aortic aneurysm (AAA) and surgical repair of large AAAs by ultrasound is recommended for men aged 65-75 years who are current or former smokers.  Stay current with your vaccines (immunizations).   This information is not intended to replace advice given to you by your health care provider. Make sure you discuss any questions you have with your health care provider.   Document Released: 06/02/2008 Document Revised: 12/26/2014 Document Reviewed: 05/02/2011 Elsevier Interactive Patient Education 2016 [employer]   Smoking Cessation, Tips for Success If you are ready to quit smoking, congratulations! You have chosen to help yourself be healthier. Cigarettes bring nicotine, tar, carbon monoxide, and other irritants into your body. Your lungs, heart, and blood vessels will be able to work better without these poisons. There are many different ways to quit smoking. Nicotine gum, nicotine patches, a nicotine inhaler, or nicotine nasal spray can help with physical craving. Hypnosis, support groups, and medicines help break the habit of smoking. WHAT THINGS CAN I DO TO MAKE QUITTING EASIER?  Here are some tips to help you quit for good:  Pick a date when you will quit smoking completely. Tell all of your friends and family about your plan to quit on that date.  Do not try to slowly cut down on the number of cigarettes you are smoking. Pick a quit date and quit smoking completely starting on that day.  Throw away all cigarettes.    Clean and remove all ashtrays from your home, work, and car.  On a card, write down your reasons for quitting. Carry the card with you and read it when you get the urge to smoke.  Cleanse your body of nicotine. Drink enough water and fluids to keep your urine clear or pale yellow. Do this after quitting to flush the nicotine from your body.  Learn to predict your moods. Do not let a bad situation be your excuse to have a cigarette.   Some situations in your life might tempt you into wanting a cigarette.  Never have "just one" cigarette. It leads to wanting another and another. Remind yourself of your decision to quit.  Change habits associated with smoking. If you smoked while driving or when feeling stressed, try other activities to replace smoking. Stand up when drinking your coffee. Brush your teeth after eating. Sit in a different chair when you read the paper. Avoid alcohol while trying to quit, and try to drink fewer caffeinated beverages. Alcohol and caffeine may urge you to smoke.  Avoid foods and drinks that can trigger a desire to smoke, such as sugary or spicy foods and alcohol.  Ask people who smoke not to smoke around you.  Have something planned to do right after eating or having a cup of coffee. For example, plan to take a walk or exercise.  Try a relaxation exercise to calm you down and decrease your stress. Remember, you may be tense and nervous for the first 2 weeks after you quit, but this will pass.  Find new activities to keep your hands busy. Play with a pen, coin, or rubber band. Doodle or draw things on paper.  Brush your teeth right after eating. This will help cut down on the craving for the taste of tobacco after meals. You can also try mouthwash.   Use oral substitutes in place of cigarettes. Try using lemon drops, carrots, cinnamon sticks, or chewing gum. Keep them handy so they are available when you have the urge to smoke.  When you have the urge to smoke,  try deep breathing.  Designate your home as a nonsmoking area.  If you are a heavy smoker, ask your health care provider about a prescription for nicotine chewing gum. It can ease your withdrawal from nicotine.  Reward yourself. Set aside the cigarette money you save and buy yourself something nice.  Look for support from others. Join a support group or smoking cessation program. Ask someone at home or at work to help you with your plan to quit smoking.  Always ask yourself, "Do I need this cigarette or is this just a reflex?" Tell yourself, "Today, I choose not to smoke," or "I do not want to smoke." You are reminding yourself of your decision to quit.  Do not replace cigarette smoking with electronic cigarettes (commonly called e-cigarettes). The safety of e-cigarettes is unknown, and some may contain harmful chemicals.  If you relapse, do not give up! Plan ahead and think about what you will do the next time you get the urge to smoke. HOW WILL I FEEL WHEN I QUIT SMOKING? You may have symptoms of withdrawal because your body is used to nicotine (the addictive substance in cigarettes). You may crave cigarettes, be irritable, feel very hungry, cough often, get headaches, or have difficulty concentrating. The withdrawal symptoms are only temporary. They are strongest when you first quit but will go away within 10-14 days. When withdrawal symptoms occur, stay in control. Think about your reasons for quitting. Remind yourself that these are signs that your body is healing and getting used to being without cigarettes. Remember that withdrawal symptoms are easier to treat than the major diseases that smoking can cause.  Even after the withdrawal is over, expect periodic urges to smoke. However, these cravings are generally short lived and will go away whether you smoke or not. Do not smoke! WHAT RESOURCES ARE AVAILABLE TO HELP ME QUIT SMOKING? Your health care provider can direct   you to community  resources or hospitals for support, which may include:  Group support.  Education.  Hypnosis.  Therapy.   This information is not intended to replace advice given to you by your health care provider. Make sure you discuss any questions you have with your health care provider.   Document Released: 09/02/2004 Document Revised: 12/26/2014 Document Reviewed: 05/23/2013 Elsevier Interactive Patient Education 2016 [employer]   

## 2016-02-02 NOTE — Assessment & Plan Note (Signed)
Doing very well. A1c down to 6.5! Continue current regimen. Continue to monitor. Recheck in 3 months, if still this good, can go 6 months.

## 2016-02-02 NOTE — Assessment & Plan Note (Signed)
Checking labs today. Continue current regimen. Continue to monitor.  

## 2016-02-02 NOTE — Assessment & Plan Note (Signed)
Checking labs today. BP under good control. Continue current regimen. Continue to monitor.

## 2016-02-02 NOTE — Progress Notes (Signed)
BP 131/71 mmHg  Pulse 65  Temp(Src) 96.7 F (35.9 C)  Ht 6' 0.3" (1.836 m)  Wt 217 lb (98.431 kg)  BMI 29.20 kg/m2  SpO2 96%   Subjective:    Patient ID: Manuel Burnett, male    DOB: 07-09-1956, 60 y.o.   MRN: [id number]  HPI: Manuel Burnett is a 60 y.o. male presenting on 02/02/2016 for comprehensive medical examination. Current medical complaints include:  DIABETES- concerned because his readings in the AM have been higher than expected, was very itchy with the trulicity, has mainly gone away, but is still acting up Hypoglycemic episodes:no Polydipsia/polyuria: no Visual disturbance: no Chest pain: no Paresthesias: no Glucose Monitoring: yes  Accucheck frequency: Daily  Fasting glucose: 120s-150s Taking Insulin?: no Blood Pressure Monitoring: not checking Retinal Examination: Up to Date Foot Exam: Up to Date Diabetic Education: Completed Pneumovax: Administered today Influenza: Up to Date Aspirin: yes  HYPERTENSION / HYPERLIPIDEMIA Satisfied with current treatment? yes Duration of hypertension: chronic BP monitoring frequency: rarely BP medication side effects: no Duration of hyperlipidemia: chronic Cholesterol medication side effects: no Cholesterol supplements: none Medication compliance: excellent compliance Aspirin: yes Recent stressors: no Recurrent headaches: no Visual changes: no Palpitations: no Dyspnea: no Chest pain: no Lower extremity edema: no Dizzy/lightheaded: no  LOW TESTOSTERONE- due a shot on Friday Duration: chronic Status: stable  Satisfied with current treatment:  yes Medication side effects:  no Medication compliance: excellent compliance Decreased libido: yes Fatigue: yes Depressed mood: no Muscle weakness: no Erectile dysfunction: yes  HYPOTHYROIDISM Thyroid control status:controlled Satisfied with current treatment? yes Medication side effects: no Medication compliance: excellent compliance Recent dose  adjustment:no Fatigue: yes Cold intolerance: no Heat intolerance: no Weight gain: no Weight loss: no Constipation: no Diarrhea/loose stools: yes Palpitations: no Lower extremity edema: no Anxiety/depressed mood: no  Interim Problems from his last visit: no  Depression Screen done today and results listed below:  Depression screen [facility] 2/9 02/02/2016  Decreased Interest 1  Down, Depressed, Hopeless 1  PHQ - 2 Score 2  Altered sleeping 0  Tired, decreased energy 1  Change in appetite 1  Feeling bad or failure about yourself  0  Trouble concentrating 0  Moving slowly or fidgety/restless 0  Suicidal thoughts 0  PHQ-9 Score 4  Difficult doing work/chores Somewhat difficult    Past Medical History:  Past Medical History  Diagnosis Date  . Chronic kidney disease   . Hyperlipidemia   . Diabetes mellitus without complication ([place])   . Thyroid disease   . Erectile dysfunction   . Proteinuria   . Hypogonadism male   . Hypertension     Surgical History:  Past Surgical History  Procedure Laterality Date  . Tonsillectomy and adenoidectomy    . Clavicle surgery      internal fixation    Medications:  Current Outpatient Prescriptions on File Prior to Visit  Medication Sig  . aspirin 81 MG tablet Take 81 mg by mouth daily.  . fenofibrate 160 MG tablet Take 1 tablet (160 mg total) by mouth daily.  Marland Kitchen glucose blood test strip Use as instructed  . levothyroxine (SYNTHROID, LEVOTHROID) 150 MCG tablet    No current facility-administered medications on file prior to visit.    Allergies:  Allergies  Allergen Reactions  . Codeine Sulfate Rash    Social History:  Social History   Social History  . Marital Status: Married    Spouse Name: N/A  . Number of Children: N/A  . Years of  Education: N/A   Occupational History  . Not on file.   Social History Main Topics  . Smoking status: Current Every Day Smoker -- 0.50 packs/day  . Smokeless tobacco: Never Used  .  Alcohol Use: No  . Drug Use: No  . Sexual Activity: Not on file   Other Topics Concern  . Not on file   Social History Narrative   History  Smoking status  . Current Every Day Smoker -- 0.50 packs/day  Smokeless tobacco  . Never Used   History  Alcohol Use No    Family History:  Family History  Problem Relation Age of Onset  . Hypertension Mother   . Heart disease Father   . Heart attack Father   . CAD Maternal Grandmother   . Cancer Maternal Grandfather     Past medical history, surgical history, medications, allergies, family history and social history reviewed with patient today and changes made to appropriate areas of the chart.   Review of Systems  Constitutional: Positive for diaphoresis. Negative for fever, chills, weight loss and malaise/fatigue.  HENT: Negative.   Eyes: Negative.   Respiratory: Negative.   Cardiovascular: Negative.   Gastrointestinal: Positive for heartburn (only with [place] food), diarrhea and constipation. Negative for nausea, vomiting, abdominal pain, blood in stool and melena.  Genitourinary: Negative.   Musculoskeletal: Negative.   Skin: Positive for itching. Negative for rash.  Neurological: Negative.  Negative for weakness.  Endo/Heme/Allergies: Positive for environmental allergies and polydipsia. Does not bruise/bleed easily.  Psychiatric/Behavioral: Negative.     All other ROS negative except what is listed above and in the HPI.      Objective:    BP 131/71 mmHg  Pulse 65  Temp(Src) 96.7 F (35.9 C)  Ht 6' 0.3" (1.836 m)  Wt 217 lb (98.431 kg)  BMI 29.20 kg/m2  SpO2 96%  Wt Readings from Last 3 Encounters:  02/02/16 217 lb (98.431 kg)  10/12/15 220 lb (99.791 kg)  09/11/15 222 lb (100.699 kg)    Physical Exam  Constitutional: He is oriented to person, place, and time. He appears well-developed and well-nourished. No distress.  HENT:  Head: Normocephalic and atraumatic.  Right Ear: Hearing, tympanic membrane,  external ear and ear canal normal.  Left Ear: Hearing, tympanic membrane, external ear and ear canal normal.  Nose: Nose normal.  Mouth/Throat: Uvula is midline, oropharynx is clear and moist and mucous membranes are normal. No oropharyngeal exudate.  Eyes: Conjunctivae, EOM and lids are normal. Pupils are equal, round, and reactive to light. Right eye exhibits no discharge. Left eye exhibits no discharge. No scleral icterus.  Neck: Normal range of motion. Neck supple. No JVD present. No tracheal deviation present. No thyromegaly present.  Cardiovascular: Normal rate, regular rhythm, normal heart sounds and intact distal pulses.  Exam reveals no gallop and no friction rub.   No murmur heard. Pulmonary/Chest: Effort normal and breath sounds normal. No stridor. No respiratory distress. He has no wheezes. He has no rales. He exhibits no tenderness.  Abdominal: Soft. Bowel sounds are normal. He exhibits no distension and no mass. There is no tenderness. There is no rebound and no guarding. Hernia confirmed negative in the right inguinal area and confirmed negative in the left inguinal area.  Genitourinary: Prostate normal, testes normal and penis normal. Cremasteric reflex is present. Right testis shows no mass, no swelling and no tenderness. Right testis is descended. Cremasteric reflex is not absent on the right side. Left testis shows no mass,  no swelling and no tenderness. Left testis is descended. Cremasteric reflex is not absent on the left side. Circumcised. No penile tenderness.  Musculoskeletal: Normal range of motion. He exhibits no edema or tenderness.  Lymphadenopathy:    He has no cervical adenopathy.       Right: No inguinal adenopathy present.       Left: No inguinal adenopathy present.  Neurological: He is alert and oriented to person, place, and time. He has normal reflexes. He displays normal reflexes. No cranial nerve deficit. He exhibits normal muscle tone. Coordination normal.   Skin: Skin is warm, dry and intact. No rash noted. He is not diaphoretic. No erythema. No pallor.  Psychiatric: He has a normal mood and affect. His speech is normal and behavior is normal. Judgment and thought content normal. Cognition and memory are normal.  Nursing note and vitals reviewed.   Results for orders placed or performed in visit on 10/12/15  Pathology Report  Result Value Ref Range   PATH REPORT.SITE OF ORIGIN SPEC Comment    . Comment    PATH REPORT.FINAL DX SPEC Comment    SIGNED OUT BY: Comment    GROSS DESCRIPTION: Comment    . Comment    PAYMENT PROCEDURE Comment       Assessment & Plan:   Problem List Items Addressed This Visit      Endocrine   Diabetes mellitus due to underlying condition with diabetic neuropathy ([place])    Doing very well. A1c down to 6.5! Continue current regimen. Continue to monitor. Recheck in 3 months, if still this good, can go 6 months.       Relevant Medications   Dulaglutide (TRULICITY) A999333 [id number] SOPN   losartan (COZAAR) 25 MG tablet   JANUMET XR 50-1000 MG TB24   rosuvastatin (CRESTOR) 10 MG tablet   Other Relevant Orders   CBC with Differential/Platelet   Comprehensive metabolic panel   Microalbumin, Urine Waived   Bayer DCA Hb A1c Waived   Male hypogonadism    Checking labs today. Continue current regimen. Continue to monitor.       Relevant Orders   CBC with Differential/Platelet   Comprehensive metabolic panel   PSA   Testosterone, free, total   Hypothyroidism    Checking labs today. Continue current regimen. Continue to monitor.       Relevant Orders   CBC with Differential/Platelet   Comprehensive metabolic panel   TSH     Genitourinary   Benign hypertensive renal disease    Under good control. Continue current regimen. Continue to monitor.      Relevant Orders   CBC with Differential/Platelet   Comprehensive metabolic panel   Microalbumin, Urine Waived   Chronic kidney disease, stage II (mild)     Checking labs today. BP under good control. Continue current regimen. Continue to monitor.         Other   Hyperlipemia    Labs checked today. Under good control. Continue current regimen. Continue to monitor.      Relevant Medications   amLODipine (NORVASC) 10 MG tablet   losartan (COZAAR) 25 MG tablet   hydrochlorothiazide (HYDRODIURIL) 25 MG tablet   rosuvastatin (CRESTOR) 10 MG tablet   sildenafil (VIAGRA) 100 MG tablet   Other Relevant Orders   CBC with Differential/Platelet   Comprehensive metabolic panel   Lipid Panel [place], Waived   Proteinuria    Checking labs today. Await results.       Relevant Orders  CBC with Differential/Platelet   Comprehensive metabolic panel   UA/M w/rflx Culture, Routine    Other Visit Diagnoses    Routine general medical examination at a health care facility    -  Primary    Up to date on vaccines. Colonoscopy up to date. Screening labs checked today. Continue diet and exercise. Continue to monitor.     Screening for prostate cancer        Checking labs today. Await results.     Relevant Orders    PSA    Immunization due        Pneumovax given today.    Relevant Orders    Pneumococcal polysaccharide vaccine 23-valent greater than or equal to 2yo subcutaneous/IM        Discussed aspirin prophylaxis for myocardial infarction prevention and decision was made to continue ASA  LABORATORY TESTING:  Health maintenance labs ordered today as discussed above.   The natural history of prostate cancer and ongoing controversy regarding screening and potential treatment outcomes of prostate cancer has been discussed with the patient. The meaning of a false positive PSA and a false negative PSA has been discussed. He indicates understanding of the limitations of this screening test and wishes to proceed with screening PSA testing.   IMMUNIZATIONS:   - Tdap: Tetanus vaccination status reviewed: last tetanus booster within 10 years. -  Influenza: Up to date - Pneumovax: Administered today - Prevnar: Not applicable - Zostavax vaccine: Not applicable  SCREENING: - Colonoscopy: Up to date  Discussed with patient purpose of the colonoscopy is to detect colon cancer at curable precancerous or early stages   PATIENT COUNSELING:    Sexuality: Discussed sexually transmitted diseases, partner selection, use of condoms, avoidance of unintended pregnancy  and contraceptive alternatives.   Advised to avoid cigarette smoking.  I discussed with the patient that most people either abstain from alcohol or drink within safe limits (<=14/week and <=4 drinks/occasion for males, <=7/weeks and <= 3 drinks/occasion for females) and that the risk for alcohol disorders and other health effects rises proportionally with the number of drinks per week and how often a drinker exceeds daily limits.  Discussed cessation/primary prevention of drug use and availability of treatment for abuse.   Diet: Encouraged to adjust caloric intake to maintain  or achieve ideal body weight, to reduce intake of dietary saturated fat and total fat, to limit sodium intake by avoiding high sodium foods and not adding table salt, and to maintain adequate dietary potassium and calcium preferably from fresh fruits, vegetables, and low-fat dairy products.    stressed the importance of regular exercise  Injury prevention: Discussed safety belts, safety helmets, smoke detector, smoking near bedding or upholstery.   Dental health: Discussed importance of regular tooth brushing, flossing, and dental visits.   Follow up plan: NEXT PREVENTATIVE PHYSICAL DUE IN 1 YEAR. Return in about 3 months (around 05/01/2016) for DM visit.

## 2016-02-02 NOTE — Assessment & Plan Note (Signed)
Labs checked today. Under good control. Continue current regimen. Continue to monitor.

## 2016-02-02 NOTE — Assessment & Plan Note (Signed)
Checking labs today. Await results.  

## 2016-02-03 LAB — CBC WITH DIFFERENTIAL/PLATELET
BASOS ABS: 0 10*3/uL (ref 0.0–0.2)
Basos: 1 %
EOS (ABSOLUTE): 0.3 10*3/uL (ref 0.0–0.4)
Eos: 3 %
HEMOGLOBIN: 13.6 g/dL (ref 12.6–17.7)
Hematocrit: 40 % (ref 37.5–51.0)
Immature Grans (Abs): 0 10*3/uL (ref 0.0–0.1)
Immature Granulocytes: 0 %
LYMPHS ABS: 1.7 10*3/uL (ref 0.7–3.1)
Lymphs: 22 %
MCH: 29.6 pg (ref 26.6–33.0)
MCHC: 34 g/dL (ref 31.5–35.7)
MCV: 87 fL (ref 79–97)
MONOCYTES: 11 %
MONOS ABS: 0.9 10*3/uL (ref 0.1–0.9)
Neutrophils Absolute: 5 10*3/uL (ref 1.4–7.0)
Neutrophils: 63 %
PLATELETS: 175 10*3/uL (ref 150–379)
RBC: 4.59 x10E6/uL (ref 4.14–5.80)
RDW: 13.5 % (ref 12.3–15.4)
WBC: 7.8 10*3/uL (ref 3.4–10.8)

## 2016-02-03 LAB — COMPREHENSIVE METABOLIC PANEL
A/G RATIO: 2 (ref 1.1–2.5)
ALBUMIN: 4.4 g/dL (ref 3.5–5.5)
ALT: 14 IU/L (ref 0–44)
AST: 17 IU/L (ref 0–40)
Alkaline Phosphatase: 37 IU/L — ABNORMAL LOW (ref 39–117)
BILIRUBIN TOTAL: 0.3 mg/dL (ref 0.0–1.2)
BUN / CREAT RATIO: 17 (ref 9–20)
BUN: 24 mg/dL (ref 6–24)
CHLORIDE: 101 mmol/L (ref 96–106)
CO2: 24 mmol/L (ref 18–29)
Calcium: 9.5 mg/dL (ref 8.7–10.2)
Creatinine, Ser: 1.38 mg/dL — ABNORMAL HIGH (ref 0.76–1.27)
GFR calc non Af Amer: 56 mL/min/{1.73_m2} — ABNORMAL LOW (ref >59)
GFR, EST AFRICAN AMERICAN: 65 mL/min/{1.73_m2} (ref >59)
GLOBULIN, TOTAL: 2.2 g/dL (ref 1.5–4.5)
Glucose: 132 mg/dL — ABNORMAL HIGH (ref 65–99)
POTASSIUM: 3.9 mmol/L (ref 3.5–5.2)
SODIUM: 141 mmol/L (ref 134–144)
TOTAL PROTEIN: 6.6 g/dL (ref 6.0–8.5)

## 2016-02-03 LAB — TESTOSTERONE, FREE, TOTAL, SHBG
Sex Hormone Binding: 39.3 nmol/L (ref 19.3–76.4)
Testosterone, Free: 4.3 pg/mL — ABNORMAL LOW (ref 7.2–24.0)
Testosterone: 246 ng/dL — ABNORMAL LOW (ref 348–1197)

## 2016-02-03 LAB — TSH: TSH: 0.65 u[IU]/mL (ref 0.450–4.500)

## 2016-02-03 LAB — PSA: Prostate Specific Ag, Serum: 1.5 ng/mL (ref 0.0–4.0)

## 2016-02-04 ENCOUNTER — Encounter: Payer: Self-pay | Admitting: Family Medicine

## 2016-02-19 ENCOUNTER — Other Ambulatory Visit: Payer: Self-pay | Admitting: Family Medicine

## 2016-04-07 LAB — HM DIABETES EYE EXAM

## 2016-04-14 ENCOUNTER — Encounter: Payer: Self-pay | Admitting: Family Medicine

## 2016-04-25 ENCOUNTER — Encounter: Payer: Self-pay | Admitting: Family Medicine

## 2016-04-25 ENCOUNTER — Ambulatory Visit (INDEPENDENT_AMBULATORY_CARE_PROVIDER_SITE_OTHER): Payer: Managed Care, Other (non HMO) | Admitting: Family Medicine

## 2016-04-25 ENCOUNTER — Telehealth: Payer: Self-pay | Admitting: Family Medicine

## 2016-04-25 VITALS — BP 148/82 | HR 72 | Temp 97.8°F | Wt 217.0 lb

## 2016-04-25 DIAGNOSIS — J40 Bronchitis, not specified as acute or chronic: Secondary | ICD-10-CM | POA: Diagnosis not present

## 2016-04-25 MED ORDER — AZITHROMYCIN 250 MG PO TABS: ORAL_TABLET | ORAL | Status: DC

## 2016-04-25 MED ORDER — PREDNISONE 10 MG PO TABS: ORAL_TABLET | ORAL | Status: DC

## 2016-04-25 MED ORDER — ALBUTEROL SULFATE (2.5 MG/3ML) 0.083% IN NEBU: 2.5000 mg | INHALATION_SOLUTION | Freq: Once | RESPIRATORY_TRACT | Status: DC

## 2016-04-25 NOTE — Telephone Encounter (Signed)
Pt called would like a call back from [place]. No further information provided. Thanks.

## 2016-04-25 NOTE — Telephone Encounter (Signed)
Patient called, he has a severe head and chest cold. We do not have any appointments open, I mentioned that he go to the Urgent Care in [place].

## 2016-04-25 NOTE — Progress Notes (Signed)
BP 148/82 mmHg  Pulse 72  Temp(Src) 97.8 F (36.6 C)  Wt 217 lb (98.431 kg)  SpO2 96%   Subjective:    Patient ID: Manuel Burnett, male    DOB: 01-May-1956, 60 y.o.   MRN: [id number]  HPI: Manuel Burnett is a 60 y.o. male  Chief Complaint  Patient presents with  . URI    started on Thursday, getting worse, chest and head congestion, yellow productive coughmaybe a lil fever yesterday,tried OTC mucinex dm.   UPPER RESPIRATORY TRACT INFECTION Duration: 5 days Worst symptom: congestion and body aches Fever: yes Cough: yes Shortness of breath: yes Wheezing: yes Chest pain: no Chest tightness: yes Chest congestion: yes Nasal congestion: yes Runny nose: yes Post nasal drip: no Sneezing: yes Sore throat: no Swollen glands: no Sinus pressure: yes Headache: yes Face pain: no Toothache: no Ear pain: no  Ear pressure: no  Eyes red/itching:no Eye drainage/crusting: no  Vomiting: no Rash: no Fatigue: yes Sick contacts: no Strep contacts: no  Context: worse Recurrent sinusitis: no Relief with OTC cold/cough medications: no  Treatments attempted: mucinex and pseudoephedrine    Relevant past medical, surgical, family and social history reviewed and updated as indicated. Interim medical history since our last visit reviewed. Allergies and medications reviewed and updated.  Review of Systems  Constitutional: Negative.   HENT: Positive for congestion, rhinorrhea, sinus pressure and sneezing. Negative for dental problem, drooling, ear discharge, ear pain, facial swelling, hearing loss, mouth sores, nosebleeds, postnasal drip, sore throat, tinnitus, trouble swallowing and voice change.   Respiratory: Positive for cough, chest tightness, shortness of breath and wheezing. Negative for apnea, choking and stridor.   Cardiovascular: Negative.   Psychiatric/Behavioral: Negative.     Per HPI unless specifically indicated above     Objective:    BP 148/82 mmHg  Pulse 72   Temp(Src) 97.8 F (36.6 C)  Wt 217 lb (98.431 kg)  SpO2 96%  Wt Readings from Last 3 Encounters:  04/25/16 217 lb (98.431 kg)  02/02/16 217 lb (98.431 kg)  10/12/15 220 lb (99.791 kg)    Physical Exam  Constitutional: He is oriented to person, place, and time. He appears well-developed and well-nourished. No distress.  HENT:  Head: Normocephalic and atraumatic.  Right Ear: Hearing and external ear normal.  Left Ear: Hearing and external ear normal.  Mouth/Throat: Oropharynx is clear and moist. No oropharyngeal exudate.  Eyes: Conjunctivae, EOM and lids are normal. Pupils are equal, round, and reactive to light. Right eye exhibits no discharge. Left eye exhibits no discharge. No scleral icterus.  Neck: Normal range of motion. Neck supple. No JVD present. No tracheal deviation present. No thyromegaly present.  Cardiovascular: Normal rate, regular rhythm, normal heart sounds and intact distal pulses.  Exam reveals no gallop and no friction rub.   No murmur heard. Pulmonary/Chest: Effort normal. No stridor. No respiratory distress. He has wheezes. He has no rales. He exhibits no tenderness.  Musculoskeletal: Normal range of motion.  Lymphadenopathy:    He has cervical adenopathy.  Neurological: He is alert and oriented to person, place, and time.  Skin: Skin is warm, dry and intact. No rash noted. He is not diaphoretic. No erythema. No pallor.  Psychiatric: He has a normal mood and affect. His speech is normal and behavior is normal. Judgment and thought content normal. Cognition and memory are normal.  Nursing note and vitals reviewed.   Results for orders placed or performed in visit on 04/12/16  HM DIABETES  EYE EXAM  Result Value Ref Range   HM Diabetic Eye Exam No Retinopathy No Retinopathy      Assessment & Plan:   Problem List Items Addressed This Visit    None    Visit Diagnoses    Bronchitis    -  Primary    Better after neb. Will treat with prednisone and z-pack.  Recheck at follow up. Call with any concerns.     Relevant Medications    albuterol (PROVENTIL) (2.5 MG/3ML) 0.083% nebulizer solution 2.5 mg        Follow up plan: Return As scheduled for DM and lung recheck.

## 2016-05-06 ENCOUNTER — Encounter: Payer: Self-pay | Admitting: Family Medicine

## 2016-05-06 ENCOUNTER — Ambulatory Visit (INDEPENDENT_AMBULATORY_CARE_PROVIDER_SITE_OTHER): Payer: Managed Care, Other (non HMO) | Admitting: Family Medicine

## 2016-05-06 VITALS — BP 111/69 | HR 71 | Temp 97.9°F | Wt 218.0 lb

## 2016-05-06 DIAGNOSIS — E0840 Diabetes mellitus due to underlying condition with diabetic neuropathy, unspecified: Secondary | ICD-10-CM

## 2016-05-06 LAB — BAYER DCA HB A1C WAIVED: HB A1C: 6.7 % (ref <7.0)

## 2016-05-06 NOTE — Assessment & Plan Note (Addendum)
Under good control. A1c 6.7. Recheck 3 months, and if still below 7, will check in 6 months. Continue current regimen. Continue to monitor.

## 2016-05-06 NOTE — Progress Notes (Signed)
BP 111/69 mmHg  Pulse 71  Temp(Src) 97.9 F (36.6 C)  Wt 218 lb (98.884 kg)  SpO2 97%   Subjective:    Patient ID: Manuel Burnett, male    DOB: [birth year], 60 y.o.   MRN: [id number]  HPI: Manuel Burnett is a 60 y.o. male  Chief Complaint  Patient presents with  . Diabetes    routine follow up and labs   DIABETES Hypoglycemic episodes:no Polydipsia/polyuria: no Visual disturbance: no Chest pain: no Paresthesias: no Glucose Monitoring: yes  Accucheck frequency: Daily Taking Insulin?: no Blood Pressure Monitoring: not checking Retinal Examination: Up to Date Foot Exam: Up to Date Diabetic Education: Completed Pneumovax: Up to Date Influenza: Up to Date Aspirin: yes  Relevant past medical, surgical, family and social history reviewed and updated as indicated. Interim medical history since our last visit reviewed. Allergies and medications reviewed and updated.  Review of Systems  Constitutional: Negative.   Respiratory: Negative.   Cardiovascular: Negative.   Psychiatric/Behavioral: Negative.     Per HPI unless specifically indicated above     Objective:    BP 111/69 mmHg  Pulse 71  Temp(Src) 97.9 F (36.6 C)  Wt 218 lb (98.884 kg)  SpO2 97%  Wt Readings from Last 3 Encounters:  05/06/16 218 lb (98.884 kg)  04/25/16 217 lb (98.431 kg)  02/02/16 217 lb (98.431 kg)    Physical Exam  Constitutional: He is oriented to person, place, and time. He appears well-developed and well-nourished. No distress.  HENT:  Head: Normocephalic and atraumatic.  Right Ear: Hearing normal.  Left Ear: Hearing normal.  Nose: Nose normal.  Eyes: Conjunctivae and lids are normal. Right eye exhibits no discharge. Left eye exhibits no discharge. No scleral icterus.  Cardiovascular: Normal rate, regular rhythm, normal heart sounds and intact distal pulses.  Exam reveals no gallop and no friction rub.   No murmur heard. Pulmonary/Chest: Effort normal and breath sounds normal.  No respiratory distress. He has no wheezes. He has no rales. He exhibits no tenderness.  Musculoskeletal: Normal range of motion.  Neurological: He is alert and oriented to person, place, and time.  Skin: Skin is warm, dry and intact. No rash noted. He is not diaphoretic. No erythema. No pallor.  Psychiatric: He has a normal mood and affect. His speech is normal and behavior is normal. Judgment and thought content normal. Cognition and memory are normal.  Nursing note and vitals reviewed.   Results for orders placed or performed in visit on 04/12/16  HM DIABETES EYE EXAM  Result Value Ref Range   HM Diabetic Eye Exam No Retinopathy No Retinopathy      Assessment & Plan:   Problem List Items Addressed This Visit      Endocrine   Diabetes mellitus due to underlying condition with diabetic neuropathy ([place]) - Primary    Under good control. A1c 6.7. Recheck 3 months, and if still below 7, will check in 6 months. Continue current regimen. Continue to monitor.       Relevant Orders   Bayer DCA Hb A1c Waived       Follow up plan: Return in about 3 months (around 08/06/2016) for Follow up BP/Cholesterol/ Testosterone/DM.

## 2016-05-09 ENCOUNTER — Ambulatory Visit: Payer: Managed Care, Other (non HMO) | Admitting: Family Medicine

## 2016-05-11 ENCOUNTER — Ambulatory Visit: Payer: Managed Care, Other (non HMO)

## 2016-05-11 ENCOUNTER — Ambulatory Visit
Admission: EM | Admit: 2016-05-11 | Discharge: 2016-05-11 | Disposition: A | Discharge status: Home / Self Care | Payer: Managed Care, Other (non HMO) | Attending: Family Medicine | Admitting: Family Medicine

## 2016-05-11 ENCOUNTER — Ambulatory Visit
Admit: 2016-05-11 | Discharge: 2016-05-11 | Disposition: A | Discharge status: Home / Self Care | Payer: Managed Care, Other (non HMO) | Attending: *Deleted | Admitting: *Deleted

## 2016-05-11 ENCOUNTER — Encounter: Payer: Self-pay | Admitting: *Deleted

## 2016-05-11 ENCOUNTER — Ambulatory Visit (INDEPENDENT_AMBULATORY_CARE_PROVIDER_SITE_OTHER): Payer: Managed Care, Other (non HMO)

## 2016-05-11 DIAGNOSIS — I129 Hypertensive chronic kidney disease with stage 1 through stage 4 chronic kidney disease, or unspecified chronic kidney disease: Secondary | ICD-10-CM | POA: Insufficient documentation

## 2016-05-11 DIAGNOSIS — Z79899 Other long term (current) drug therapy: Secondary | ICD-10-CM | POA: Diagnosis not present

## 2016-05-11 DIAGNOSIS — N529 Male erectile dysfunction, unspecified: Secondary | ICD-10-CM | POA: Insufficient documentation

## 2016-05-11 DIAGNOSIS — G4733 Obstructive sleep apnea (adult) (pediatric): Secondary | ICD-10-CM | POA: Diagnosis not present

## 2016-05-11 DIAGNOSIS — F17210 Nicotine dependence, cigarettes, uncomplicated: Secondary | ICD-10-CM | POA: Insufficient documentation

## 2016-05-11 DIAGNOSIS — N2581 Secondary hyperparathyroidism of renal origin: Secondary | ICD-10-CM | POA: Diagnosis not present

## 2016-05-11 DIAGNOSIS — M79604 Pain in right leg: Secondary | ICD-10-CM

## 2016-05-11 DIAGNOSIS — E291 Testicular hypofunction: Secondary | ICD-10-CM | POA: Insufficient documentation

## 2016-05-11 DIAGNOSIS — E039 Hypothyroidism, unspecified: Secondary | ICD-10-CM | POA: Diagnosis not present

## 2016-05-11 DIAGNOSIS — X58XXXA Exposure to other specified factors, initial encounter: Secondary | ICD-10-CM | POA: Diagnosis not present

## 2016-05-11 DIAGNOSIS — Z8249 Family history of ischemic heart disease and other diseases of the circulatory system: Secondary | ICD-10-CM | POA: Diagnosis not present

## 2016-05-11 DIAGNOSIS — S8011XA Contusion of right lower leg, initial encounter: Secondary | ICD-10-CM

## 2016-05-11 DIAGNOSIS — E785 Hyperlipidemia, unspecified: Secondary | ICD-10-CM | POA: Diagnosis not present

## 2016-05-11 DIAGNOSIS — E1122 Type 2 diabetes mellitus with diabetic chronic kidney disease: Secondary | ICD-10-CM | POA: Insufficient documentation

## 2016-05-11 DIAGNOSIS — N182 Chronic kidney disease, stage 2 (mild): Secondary | ICD-10-CM | POA: Diagnosis not present

## 2016-05-11 DIAGNOSIS — Z7982 Long term (current) use of aspirin: Secondary | ICD-10-CM | POA: Diagnosis not present

## 2016-05-11 NOTE — ED Notes (Signed)
Venous Doppler scheduled for today at 4:00pm at [facility] on [address] in [place].

## 2016-05-11 NOTE — ED Provider Notes (Signed)
[facility]  ____________________________________________  Time seen: Approximately 2:54 PM  I have reviewed the triage vital signs and the nursing notes.   HISTORY  Chief Complaint Leg Injury   HPI Manuel Burnett is a 60 y.o. male  patient presents for the complaint of right leg pain post injury. Reports 10 days ago he was at a gas station and turned around to walk and walked right into a concrete post in front of the gas pump. States when he walked in to the post,  upper leg. Patient reports that day he then rode his motorcycle to the beach. Patient states that he was able to continue remaining ambulatory but with pain. Patient reports pain has continued to right upper thigh since. Patient states over the first few days he began to have swelling and bruising to his right thigh thigh that has continued with bruising. Patient also reports that he has been having some pain to the side of his right knee and side of his right calf. Patient states he occasionally will have a tingling sensation to the side of his right calf at night when he lays on that area. Denies any other numbness or tingling sensation. Denies any current numbness or tingling sensation. Reports has continued to remain ambulatory and has continued to work.  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 Denies any other pain or injury. States at initial injury he did not fall to the ground. Denies head injury or loss consciousness.  States pain is tolerable but states that he wanted the area evaluated to know what was wrong. Denies need for pain medication. Patient states some of the areas in his right thigh feels as a tightness pain from swelling. Denies ankle swelling bilaterally. Denies any chest pain, shortness of breath, dizziness, weakness, neck pain, back pain, dysuria, urinary or bowel incontinence or retention.  PCP: Janae Bridgeman   Past Medical History  Diagnosis Date  .  Chronic kidney disease   . Hyperlipidemia   . Diabetes mellitus without complication ([place])   . Thyroid disease   . Erectile dysfunction   . Proteinuria   . Hypogonadism male   . Hypertension     Patient Active Problem List   Diagnosis Date Noted  . Benign hypertensive renal disease 08/03/2015  . Hyperlipemia 08/03/2015  . Diabetes mellitus due to underlying condition with diabetic neuropathy ([place]) 08/03/2015  . Chronic kidney disease, stage II (mild) 08/03/2015  . Male hypogonadism 08/03/2015  . Hypothyroidism 08/03/2015  . Proteinuria 08/03/2015  . ED (erectile dysfunction) 08/03/2015  . OSA on CPAP 08/03/2015  . Secondary hyperparathyroidism ([place]) 08/03/2015  . Diverticulosis 08/03/2015    Past Surgical History  Procedure Laterality Date  . Tonsillectomy and adenoidectomy    . Clavicle surgery      internal fixation    Current Outpatient Rx  Name  Route  Sig  Dispense  Refill  . amLODipine (NORVASC) 10 MG tablet   Oral   Take 1 tablet (10 mg total) by mouth daily.   90 tablet   1   . aspirin 81 MG tablet   Oral   Take 81 mg by mouth daily.         . B-D INTEGRA SYRINGE 22G X 1-1/2" 3 ML MISC      Inject testosterone 1x every 2 weeks   100 each   1     Dispense as written.   . Dulaglutide (TRULICITY) A999333 0000000 SOPN  Subcutaneous   Inject 0.75 mg into the skin once a week.   4 pen   3   . fenofibrate 160 MG tablet   Oral   Take 1 tablet (160 mg total) by mouth daily.   90 tablet   1   . glucose blood test strip      Use as instructed   100 each   12   . hydrochlorothiazide (HYDRODIURIL) 25 MG tablet   Oral   Take 1 tablet (25 mg total) by mouth daily.   90 tablet   1   . JANUMET XR 50-1000 MG TB24   Oral   Take 2 tablets by mouth daily.   180 tablet   1     Dispense as written.   Marland Kitchen levothyroxine (SYNTHROID, LEVOTHROID) 150 MCG tablet      take 1 tablet by mouth once daily   90 tablet   3   . losartan (COZAAR) 25 MG  tablet   Oral   Take 0.5 tablets (12.5 mg total) by mouth daily.   90 tablet   1   . rosuvastatin (CRESTOR) 10 MG tablet   Oral   Take 1 tablet (10 mg total) by mouth daily.   90 tablet   1   . sildenafil (VIAGRA) 100 MG tablet   Oral   Take 1 tablet (100 mg total) by mouth daily as needed for erectile dysfunction.   10 tablet   12   . testosterone cypionate (DEPOTESTOSTERONE CYPIONATE) 200 MG/ML injection   Intramuscular   Inject 0.5 mLs (100 mg total) into the muscle every 14 (fourteen) days.   10 mL   2   . YALE DISP NEEDLES 18GX1" 18G X 1" MISC      Inject testosterone every 2 weeks as directed   100 each   1     Dispense as written.     Allergies Codeine sulfate  Family History  Problem Relation Age of Onset  . Hypertension Mother   . Heart disease Father   . Heart attack Father   . CAD Maternal Grandmother   . Cancer Maternal Grandfather     Social History Social History  Substance Use Topics  . Smoking status: Current Every Day Smoker -- 0.50 packs/day  . Smokeless tobacco: Never Used  . Alcohol Use: 0.0 oz/week    0 Standard drinks or equivalent per week    Review of Systems Constitutional: No fever/chills Eyes: No visual changes. ENT: No sore throat. Cardiovascular: Denies chest pain. Respiratory: Denies shortness of breath. Gastrointestinal: No abdominal pain.  No nausea, no vomiting.  No diarrhea.  No constipation. Genitourinary: Negative for dysuria. Musculoskeletal: Negative for back pain. Right leg pain.  Skin: Negative for rash. Neurological: Negative for headaches, focal weakness or numbness.  10-point ROS otherwise negative.  ____________________________________________   PHYSICAL EXAM:  VITAL SIGNS: ED Triage Vitals  Enc Vitals Group     BP 05/11/16 1357 151/82 mmHg     Pulse Rate 05/11/16 1357 78     Resp 05/11/16 1357 18     Temp 05/11/16 1357 98 F (36.7 C)     Temp Source 05/11/16 1357 Oral     SpO2 05/11/16 1357  99 %     Weight 05/11/16 1357 218 lb (98.884 kg)     Height 05/11/16 1357 6\' 1"  (1.854 m)     Head Cir --      Peak Flow --      Pain Score  05/11/16 1401 5     Pain Loc --      Pain Edu? --      Excl. in [place]? --     Constitutional: Alert and oriented. Well appearing and in no acute distress. Eyes: Conjunctivae are normal. PERRL. EOMI. Head: Atraumatic.  Mouth/Throat: Mucous membranes are moist.   Neck: No stridor.  No cervical spine tenderness to palpation. Cardiovascular: Normal rate, regular rhythm. Grossly normal heart sounds.  Good peripheral circulation. Respiratory: Normal respiratory effort.  No retractions. Lungs CTAB. Gastrointestinal: Soft and nontender.  No CVA tenderness. Musculoskeletal: No lower or upper extremity tenderness nor edema.No cervical, thoracic or lumbar tenderness to palpation. Bilateral pedal pulses equal and easily palpated.  Except: right anterior to lateral mid to distal thigh mild tenderness to palpation, with mild ecchymosis, right medial distal thigh mild to mod healing ecchymosis, right anterior knee mild tenderness to palpation, right posterior to lateral calf mild tenderness to palpation. Measured cirumferences: right thigh 48.5 cm, left thigh 46cm; right knee 42.5cm, left knee 42cm; right calf 43cm, left calf 43cm. No distal lower extremity swelling bilaterally. Bilateral posterior tibialis and dorsalis pedis pulses equal bilaterally and easily palpable. Full ROM to right lower extremity. Right knee mild pain with medial and latera stress test. Steady gait. Right lower extremity no erythema, induration or fluctuance.  Neurologic:  Normal speech and language. No gross focal neurologic deficits are appreciated. No gait instability. Skin:  Skin is warm, dry and intact. No rash noted. Psychiatric: Mood and affect are normal. Speech and behavior are normal.  ____________________________________________   LABS (all labs ordered are listed, but only abnormal  results are displayed)  Labs Reviewed - No data to display  RADIOLOGY  Dg Tibia/fibula Right  05/11/2016  CLINICAL DATA:  Walked into middle post at gas station 10 days ago. Worsening pain. Numbness. EXAM: RIGHT TIBIA AND FIBULA - 2 VIEW COMPARISON:  None. FINDINGS: There is no evidence of fracture or other focal bone lesions. Soft tissues are unremarkable. IMPRESSION: Normal radiographs Electronically Signed   By: Nelson Chimes M.D.   On: 05/11/2016 15:46   Dg Femur, Min 2 Views Right  05/11/2016  CLINICAL DATA:  Walked into metal post at gas station. Injury occurred 10 days ago. Worsened pain and swelling. EXAM: RIGHT FEMUR 2 VIEWS COMPARISON:  None. FINDINGS: There is no evidence of fracture or other focal bone lesions. Soft tissues are unremarkable. IMPRESSION: Normal radiographs Electronically Signed   By: Nelson Chimes M.D.   On: 05/11/2016 15:45     INITIAL IMPRESSION / ASSESSMENT AND PLAN / ED COURSE  Pertinent labs & imaging results that were available during my care of the patient were reviewed by me and considered in my medical decision making (see chart for details).  Very well appearing. Patient with hx diabetes, htn, Hyperlipidemia and smoker who presents for right thigh and right lower extremity pain for the last 10 days. Patient reports he actually walked into a concrete post at the gas station. Patient reports swelling and bruising has persisted with some change in pain medications area patient states he has pain in his right thigh right knee and right lateral calf. Suspect contusion injuries with resolving hematoma. However will evaluate x-rays femur and tibia fibula. Discussed with patient as right thigh is larger in circumference than left will also evaluate ultrasound as patient does have some risk factors for deep venous thrombosis. Patient verbalized understanding and agrees to this plan.  Per radiologist right femur and right  tibia fibula x-rays are normal. Outpatient  ultrasound of right lower extremity ordered for 4:00. Patient to go directly to [facility] regional for ultrasound. Heather RN directed patient in this. Patient verbalized understanding and agreed to this plan. Patient denies need for pain medication.  Discussed follow up with Primary care physician this week. Discussed follow up and return parameters including no resolution or any worsening concerns. Patient verbalized understanding and agreed to plan.   ____________________________________________   FINAL CLINICAL IMPRESSION(S) / ED DIAGNOSES  Final diagnoses:  Lower extremity pain, right  Contusion of lower extremity, right, initial encounter     Discharge Medication List as of 05/11/2016  3:21 PM      Note: This dictation was prepared with Dragon dictation along with smaller phrase technology. Any transcriptional errors that result from this process are unintentional.       Marylene Land, NP 05/11/16 [place]  Radiology report called by ultrasound tech, right lower extremity ultrasound negative for deep venous thrombosis. Discussed these findings with patient via telephone, who verbalized understanding of results. Suspect contusion injuries. Encourage rest, ice and elevation. Denies need for pain medication. Follow up with primary care physician as needed. Patient verbalized understanding and agreed to plan.   EXAM: RIGHT LOWER EXTREMITY VENOUS DOPPLER ULTRASOUND  TECHNIQUE: Gray-scale sonography with graded compression, as well as color Doppler and duplex ultrasound were performed to evaluate the lower extremity deep venous systems from the level of the common femoral vein and including the common femoral, femoral, profunda femoral, popliteal and calf veins including the posterior tibial, peroneal and gastrocnemius veins when visible. The superficial great saphenous vein was also interrogated. Spectral Doppler was utilized to evaluate flow at rest and  with distal augmentation maneuvers in the common femoral, femoral and popliteal veins.  COMPARISON: None.  FINDINGS: Contralateral Common Femoral Vein: Respiratory phasicity is normal and symmetric with the symptomatic side. No evidence of thrombus. Normal compressibility.  Common Femoral Vein: No evidence of thrombus. Normal compressibility, respiratory phasicity and response to augmentation.  Saphenofemoral Junction: No evidence of thrombus. Normal compressibility and flow on color Doppler imaging.  Profunda Femoral Vein: No evidence of thrombus. Normal compressibility and flow on color Doppler imaging.  Femoral Vein: No evidence of thrombus. Normal compressibility, respiratory phasicity and response to augmentation.  Popliteal Vein: No evidence of thrombus. Normal compressibility, respiratory phasicity and response to augmentation.  Calf Veins: No evidence of thrombus. Normal compressibility and flow on color Doppler imaging.  Superficial Great Saphenous Vein: No evidence of thrombus. Normal compressibility and flow on color Doppler imaging.  Venous Reflux: None.  Other Findings: None.  IMPRESSION: No evidence of deep venous thrombosis in the right lower extremity.  These results will be called to the ordering clinician or representative by the Radiology Department at the imaging location.  Marylene Land, NP 05/11/16 1702

## 2016-05-11 NOTE — ED Notes (Addendum)
Patient injured his right upper leg when he walked into a metal post outside of a fuel station. The injury occurred on 05/01/2016 and has become worse with swelling around his right knee and lower right leg numbness.

## 2016-05-11 NOTE — Discharge Instructions (Signed)
Rest. Elevate. Apply ice.   Follow up with your primary care physician this week as needed.Follow up with orthopedic as needed for continued pain.  Return to Urgent care for new or worsening concerns.    Contusion A contusion is a deep bruise. Contusions are the result of a blunt injury to tissues and muscle fibers under the skin. The injury causes bleeding under the skin. The skin overlying the contusion may turn blue, purple, or yellow. Minor injuries will give you a painless contusion, but more severe contusions may stay painful and swollen for a few weeks.  CAUSES  This condition is usually caused by a blow, trauma, or direct force to an area of the body. SYMPTOMS  Symptoms of this condition include:  Swelling of the injured area.  Pain and tenderness in the injured area.  Discoloration. The area may have redness and then turn blue, purple, or yellow. DIAGNOSIS  This condition is diagnosed based on a physical exam and medical history. An X-ray, CT scan, or MRI may be needed to determine if there are any associated injuries, such as broken bones (fractures). TREATMENT  Specific treatment for this condition depends on what area of the body was injured. In general, the best treatment for a contusion is resting, icing, applying pressure to (compression), and elevating the injured area. This is often called the RICE strategy. Over-the-counter anti-inflammatory medicines may also be recommended for pain control.  HOME CARE INSTRUCTIONS   Rest the injured area.  If directed, apply ice to the injured area:  Put ice in a plastic bag.  Place a towel between your skin and the bag.  Leave the ice on for 20 minutes, 2-3 times per day.  If directed, apply light compression to the injured area using an elastic bandage. Make sure the bandage is not wrapped too tightly. Remove and reapply the bandage as directed by your health care provider.  If possible, raise (elevate) the injured area above  the level of your heart while you are sitting or lying down.  Take over-the-counter and prescription medicines only as told by your health care provider. SEEK MEDICAL CARE IF:  Your symptoms do not improve after several days of treatment.  Your symptoms get worse.  You have difficulty moving the injured area. SEEK IMMEDIATE MEDICAL CARE IF:   You have severe pain.  You have numbness in a hand or foot.  Your hand or foot turns pale or cold.   This information is not intended to replace advice given to you by your health care provider. Make sure you discuss any questions you have with your health care provider.   Document Released: 09/14/2005 Document Revised: 08/26/2015 Document Reviewed: 04/22/2015 Elsevier Interactive Patient Education [employer].

## 2016-06-29 ENCOUNTER — Other Ambulatory Visit: Payer: Self-pay | Admitting: Family Medicine

## 2016-07-11 ENCOUNTER — Telehealth: Payer: Self-pay

## 2016-07-11 MED ORDER — FENOFIBRATE 160 MG PO TABS: 160.0000 mg | ORAL_TABLET | Freq: Every day | ORAL | 1 refills | Status: DC

## 2016-07-11 NOTE — Telephone Encounter (Signed)
Rx sent to his pharmacy

## 2016-07-11 NOTE — Telephone Encounter (Signed)
Fax from pharmacy wanting 160 mg tablets

## 2016-08-08 ENCOUNTER — Ambulatory Visit: Payer: Managed Care, Other (non HMO) | Admitting: Family Medicine

## 2016-08-08 ENCOUNTER — Encounter: Payer: Self-pay | Admitting: Family Medicine

## 2016-08-08 ENCOUNTER — Ambulatory Visit (INDEPENDENT_AMBULATORY_CARE_PROVIDER_SITE_OTHER): Payer: Managed Care, Other (non HMO) | Admitting: Family Medicine

## 2016-08-08 VITALS — BP 131/64 | HR 69 | Temp 98.5°F | Ht 73.0 in | Wt 220.0 lb

## 2016-08-08 DIAGNOSIS — I129 Hypertensive chronic kidney disease with stage 1 through stage 4 chronic kidney disease, or unspecified chronic kidney disease: Secondary | ICD-10-CM | POA: Diagnosis not present

## 2016-08-08 DIAGNOSIS — E291 Testicular hypofunction: Secondary | ICD-10-CM

## 2016-08-08 DIAGNOSIS — N2581 Secondary hyperparathyroidism of renal origin: Secondary | ICD-10-CM | POA: Diagnosis not present

## 2016-08-08 DIAGNOSIS — N182 Chronic kidney disease, stage 2 (mild): Secondary | ICD-10-CM

## 2016-08-08 DIAGNOSIS — E039 Hypothyroidism, unspecified: Secondary | ICD-10-CM | POA: Diagnosis not present

## 2016-08-08 DIAGNOSIS — E0840 Diabetes mellitus due to underlying condition with diabetic neuropathy, unspecified: Secondary | ICD-10-CM | POA: Diagnosis not present

## 2016-08-08 DIAGNOSIS — E785 Hyperlipidemia, unspecified: Secondary | ICD-10-CM | POA: Diagnosis not present

## 2016-08-08 LAB — MICROALBUMIN, URINE WAIVED
Creatinine, Urine Waived: 50 mg/dL (ref 10–300)
Microalb, Ur Waived: 30 mg/L — ABNORMAL HIGH (ref 0–19)

## 2016-08-08 LAB — UA/M W/RFLX CULTURE, ROUTINE
BILIRUBIN UA: NEGATIVE
KETONES UA: NEGATIVE
LEUKOCYTES UA: NEGATIVE
NITRITE UA: NEGATIVE
PH UA: 6.5 (ref 5.0–7.5)
Protein, UA: NEGATIVE
RBC, UA: NEGATIVE
SPEC GRAV UA: 1.01 (ref 1.005–1.030)
Urobilinogen, Ur: 0.2 mg/dL (ref 0.2–1.0)

## 2016-08-08 LAB — LIPID PANEL PICCOLO, WAIVED
CHOL/HDL RATIO PICCOLO,WAIVE: 3.2 mg/dL
CHOLESTEROL PICCOLO, WAIVED: 146 mg/dL (ref <200)
HDL Chol Piccolo, Waived: 46 mg/dL — ABNORMAL LOW (ref >59)
LDL Chol Calc Piccolo Waived: 47 mg/dL (ref <100)
TRIGLYCERIDES PICCOLO,WAIVED: 263 mg/dL — AB (ref <150)
VLDL Chol Calc Piccolo,Waive: 53 mg/dL — ABNORMAL HIGH (ref <30)

## 2016-08-08 LAB — BAYER DCA HB A1C WAIVED: HB A1C: 6.6 % (ref <7.0)

## 2016-08-08 MED ORDER — TESTOSTERONE CYPIONATE 200 MG/ML IM SOLN: 100.0000 mg | INTRAMUSCULAR | 2 refills | Status: DC

## 2016-08-08 MED ORDER — AMLODIPINE BESYLATE 10 MG PO TABS: 10.0000 mg | ORAL_TABLET | Freq: Every day | ORAL | 1 refills | Status: DC

## 2016-08-08 MED ORDER — "BD INTEGRA SYRINGE 22G X 1-1/2"" 3 ML MISC": 1 refills | Status: DC

## 2016-08-08 MED ORDER — DULAGLUTIDE 0.75 MG/0.5ML ~~LOC~~ SOAJ: SUBCUTANEOUS | 3 refills | Status: DC

## 2016-08-08 MED ORDER — "YALE DISP NEEDLES 18G X 1"" MISC": 1 refills | Status: DC

## 2016-08-08 MED ORDER — LEVOTHYROXINE SODIUM 150 MCG PO TABS: 150.0000 ug | ORAL_TABLET | Freq: Every day | ORAL | 3 refills | Status: DC

## 2016-08-08 MED ORDER — LOSARTAN POTASSIUM 25 MG PO TABS: 12.5000 mg | ORAL_TABLET | Freq: Every day | ORAL | 1 refills | Status: DC

## 2016-08-08 MED ORDER — SILDENAFIL CITRATE 100 MG PO TABS: 100.0000 mg | ORAL_TABLET | Freq: Every day | ORAL | 12 refills | Status: DC | PRN

## 2016-08-08 MED ORDER — GLUCOSE BLOOD VI STRP
ORAL_STRIP | 12 refills | Status: DC
Start: 2016-08-08 — End: 2018-07-09

## 2016-08-08 MED ORDER — FENOFIBRATE 160 MG PO TABS: 160.0000 mg | ORAL_TABLET | Freq: Every day | ORAL | 1 refills | Status: DC

## 2016-08-08 MED ORDER — JANUMET XR 50-1000 MG PO TB24: 2.0000 | ORAL_TABLET | Freq: Every day | ORAL | 1 refills | Status: DC

## 2016-08-08 MED ORDER — HYDROCHLOROTHIAZIDE 25 MG PO TABS: 25.0000 mg | ORAL_TABLET | Freq: Every day | ORAL | 1 refills | Status: DC

## 2016-08-08 MED ORDER — ROSUVASTATIN CALCIUM 10 MG PO TABS: 10.0000 mg | ORAL_TABLET | Freq: Every day | ORAL | 1 refills | Status: DC

## 2016-08-08 NOTE — Assessment & Plan Note (Signed)
Under good control. Continue current regimen. Continue to monitor. Call with any concerns. 

## 2016-08-08 NOTE — Assessment & Plan Note (Signed)
Checking calcium today.

## 2016-08-08 NOTE — Assessment & Plan Note (Signed)
Rechecking levels today. Continue to monitor.  

## 2016-08-08 NOTE — Assessment & Plan Note (Signed)
A1c came back at 6.6. Continue current regimen. Recheck 6 months.

## 2016-08-08 NOTE — Assessment & Plan Note (Addendum)
Under good control. Rechecking testosterone today. He is between doses.

## 2016-08-08 NOTE — Assessment & Plan Note (Signed)
Under good control. Continue current regimen. Continue to monitor.  

## 2016-08-08 NOTE — Progress Notes (Signed)
BP 131/64 (BP Location: Left Arm, Patient Position: Sitting, Cuff Size: Large)   Pulse 69   Temp 98.5 F (36.9 C)   Ht 6\' 1"  (1.854 m)   Wt 220 lb (99.8 kg)   SpO2 96%   BMI 29.03 kg/m    Subjective:    Patient ID: Manuel Burnett, male    DOB: 08-08-1956, 60 y.o.   MRN: [id number]  HPI: Manuel Burnett is a 60 y.o. male  Chief Complaint  Patient presents with  . Diabetes  . Hypertension  . Hyperlipidemia  . Hypothyroidism  . Hypogonadism   LOW TESTOSTERONE Duration: chronic Status: stable  Satisfied with current treatment:  yes Previous testosterone therapies: Medication side effects:  no Medication compliance: excellent compliance Decreased libido: no Fatigue: no Depressed mood: no Muscle weakness: no Erectile dysfunction: yes  DIABETES Hypoglycemic episodes:no Polydipsia/polyuria: no Visual disturbance: no Chest pain: no Paresthesias: no Glucose Monitoring: no Taking Insulin?: no Blood Pressure Monitoring: not checking Retinal Examination: Up to Date Foot Exam: Up to Date Diabetic Education: Completed Pneumovax: Up to Date Influenza: Up to Date Aspirin: yes  HYPERTENSION / HYPERLIPIDEMIA Satisfied with current treatment? yes Duration of hypertension: chronic BP monitoring frequency: not checking BP medication side effects: no Duration of hyperlipidemia: chronic Cholesterol medication side effects: no Cholesterol supplements: none Past cholesterol medications: atorvastain (lipitor) Medication compliance: excellent compliance Aspirin: yes Recent stressors: no Recurrent headaches: no Visual changes: no Palpitations: no Dyspnea: no Chest pain: no Lower extremity edema: no Dizzy/lightheaded: no  Relevant past medical, surgical, family and social history reviewed and updated as indicated. Interim medical history since our last visit reviewed. Allergies and medications reviewed and updated.  Review of Systems  Constitutional: Negative.     Respiratory: Negative.   Cardiovascular: Negative.   Psychiatric/Behavioral: Negative.     Per HPI unless specifically indicated above     Objective:    BP 131/64 (BP Location: Left Arm, Patient Position: Sitting, Cuff Size: Large)   Pulse 69   Temp 98.5 F (36.9 C)   Ht 6\' 1"  (1.854 m)   Wt 220 lb (99.8 kg)   SpO2 96%   BMI 29.03 kg/m   Wt Readings from Last 3 Encounters:  08/08/16 220 lb (99.8 kg)  05/11/16 218 lb (98.9 kg)  05/06/16 218 lb (98.9 kg)    Physical Exam  Constitutional: He is oriented to person, place, and time. He appears well-developed and well-nourished. No distress.  HENT:  Head: Normocephalic and atraumatic.  Right Ear: Hearing normal.  Left Ear: Hearing normal.  Nose: Nose normal.  Eyes: Conjunctivae and lids are normal. Right eye exhibits no discharge. Left eye exhibits no discharge. No scleral icterus.  Cardiovascular: Normal rate, regular rhythm, normal heart sounds and intact distal pulses.  Exam reveals no gallop and no friction rub.   No murmur heard. Pulmonary/Chest: Effort normal and breath sounds normal. No respiratory distress. He has no wheezes. He has no rales. He exhibits no tenderness.  Musculoskeletal: Normal range of motion.  Neurological: He is alert and oriented to person, place, and time.  Skin: Skin is warm, dry and intact. No rash noted. He is not diaphoretic. No erythema. No pallor.  Psychiatric: He has a normal mood and affect. His speech is normal and behavior is normal. Judgment and thought content normal. Cognition and memory are normal.  Nursing note and vitals reviewed.   Results for orders placed or performed in visit on 05/06/16  Bayer [place] Hb A1c Waived  Result Value Ref Range   Bayer DCA Hb A1c Waived 6.7 <7.0 %      Assessment & Plan:   Problem List Items Addressed This Visit      Endocrine   Diabetes mellitus due to underlying condition with diabetic neuropathy ([place]) - Primary    A1c came back at 6.6.  Continue current regimen. Recheck 6 months.       Relevant Medications   JANUMET XR 50-1000 MG TB24   losartan (COZAAR) 25 MG tablet   rosuvastatin (CRESTOR) 10 MG tablet   Dulaglutide (TRULICITY) A999333 0000000 SOPN   Other Relevant Orders   Comprehensive metabolic panel   Bayer DCA Hb A1c Waived   Microalbumin, Urine Waived   UA/M w/rflx Culture, Routine   Male hypogonadism    Under good control. Rechecking testosterone today. He is between doses.       Relevant Orders   Comprehensive metabolic panel   Testosterone, free, total   Hypothyroidism    Rechecking levels today. Continue to monitor.       Relevant Medications   levothyroxine (SYNTHROID, LEVOTHROID) 150 MCG tablet   Other Relevant Orders   Comprehensive metabolic panel   TSH   Secondary hyperparathyroidism ([place])    Checking calcium today.        Genitourinary   Benign hypertensive renal disease    Under good control. Continue current regimen. Continue to monitor. Call with any concerns.      Relevant Orders   Comprehensive metabolic panel   Microalbumin, Urine Waived   Chronic kidney disease, stage II (mild)   Relevant Orders   Comprehensive metabolic panel   Microalbumin, Urine Waived     Other   Hyperlipemia    Under good control. Continue current regimen. Continue to monitor.       Relevant Medications   amLODipine (NORVASC) 10 MG tablet   fenofibrate 160 MG tablet   hydrochlorothiazide (HYDRODIURIL) 25 MG tablet   losartan (COZAAR) 25 MG tablet   rosuvastatin (CRESTOR) 10 MG tablet   sildenafil (VIAGRA) 100 MG tablet   Other Relevant Orders   Comprehensive metabolic panel   Lipid Panel [place], Waived    Other Visit Diagnoses   None.      Follow up plan: Return in about 6 months (around 02/08/2017) for Physical.

## 2016-08-09 ENCOUNTER — Encounter: Payer: Self-pay | Admitting: Family Medicine

## 2016-08-09 LAB — COMPREHENSIVE METABOLIC PANEL
A/G RATIO: 1.9 (ref 1.2–2.2)
ALK PHOS: 43 IU/L (ref 39–117)
ALT: 12 IU/L (ref 0–44)
AST: 15 IU/L (ref 0–40)
Albumin: 4.5 g/dL (ref 3.5–5.5)
BILIRUBIN TOTAL: 0.4 mg/dL (ref 0.0–1.2)
BUN / CREAT RATIO: 11 (ref 9–20)
BUN: 13 mg/dL (ref 6–24)
CO2: 25 mmol/L (ref 18–29)
Calcium: 10 mg/dL (ref 8.7–10.2)
Chloride: 101 mmol/L (ref 96–106)
Creatinine, Ser: 1.17 mg/dL (ref 0.76–1.27)
GFR, EST AFRICAN AMERICAN: 79 mL/min/{1.73_m2} (ref >59)
GFR, EST NON AFRICAN AMERICAN: 68 mL/min/{1.73_m2} (ref >59)
GLOBULIN, TOTAL: 2.4 g/dL (ref 1.5–4.5)
Glucose: 123 mg/dL — ABNORMAL HIGH (ref 65–99)
Potassium: 3.7 mmol/L (ref 3.5–5.2)
SODIUM: 143 mmol/L (ref 134–144)
Total Protein: 6.9 g/dL (ref 6.0–8.5)

## 2016-08-09 LAB — TSH: TSH: 2.78 u[IU]/mL (ref 0.450–4.500)

## 2016-08-09 LAB — TESTOSTERONE, FREE, TOTAL, SHBG
Sex Hormone Binding: 45.6 nmol/L (ref 19.3–76.4)
TESTOSTERONE: 411 ng/dL (ref 264–916)
Testosterone, Free: 7.1 pg/mL — ABNORMAL LOW (ref 7.2–24.0)

## 2016-12-21 ENCOUNTER — Other Ambulatory Visit: Payer: Self-pay

## 2016-12-21 ENCOUNTER — Other Ambulatory Visit: Payer: Self-pay | Admitting: Family Medicine

## 2016-12-21 NOTE — Telephone Encounter (Signed)
Patient needs a prescription for the needles for his T injections sent to his pharmacy [facility] in [place]

## 2016-12-22 MED ORDER — "BD INTEGRA SYRINGE 22G X 1-1/2"" 3 ML MISC": 1 refills | Status: DC

## 2017-02-06 ENCOUNTER — Encounter: Payer: Managed Care, Other (non HMO) | Admitting: Family Medicine

## 2017-02-09 ENCOUNTER — Other Ambulatory Visit: Payer: Self-pay | Admitting: Family Medicine

## 2017-02-09 MED ORDER — DULAGLUTIDE 0.75 MG/0.5ML ~~LOC~~ SOAJ: SUBCUTANEOUS | 3 refills | Status: DC

## 2017-02-09 NOTE — Telephone Encounter (Signed)
What drug store?  [facility]

## 2017-02-09 NOTE — Telephone Encounter (Signed)
Routing to provider  

## 2017-02-13 ENCOUNTER — Encounter: Payer: Managed Care, Other (non HMO) | Admitting: Family Medicine

## 2017-03-09 ENCOUNTER — Other Ambulatory Visit: Payer: Self-pay | Admitting: Family Medicine

## 2017-04-03 ENCOUNTER — Ambulatory Visit (INDEPENDENT_AMBULATORY_CARE_PROVIDER_SITE_OTHER): Payer: Managed Care, Other (non HMO) | Admitting: Family Medicine

## 2017-04-03 ENCOUNTER — Telehealth: Payer: Self-pay | Admitting: Family Medicine

## 2017-04-03 ENCOUNTER — Encounter: Payer: Self-pay | Admitting: Family Medicine

## 2017-04-03 VITALS — BP 138/88 | HR 98 | Temp 97.9°F | Ht 72.4 in | Wt 218.1 lb

## 2017-04-03 DIAGNOSIS — N2581 Secondary hyperparathyroidism of renal origin: Secondary | ICD-10-CM

## 2017-04-03 DIAGNOSIS — E039 Hypothyroidism, unspecified: Secondary | ICD-10-CM

## 2017-04-03 DIAGNOSIS — Z0000 Encounter for general adult medical examination without abnormal findings: Secondary | ICD-10-CM

## 2017-04-03 DIAGNOSIS — R809 Proteinuria, unspecified: Secondary | ICD-10-CM | POA: Diagnosis not present

## 2017-04-03 DIAGNOSIS — E782 Mixed hyperlipidemia: Secondary | ICD-10-CM

## 2017-04-03 DIAGNOSIS — I129 Hypertensive chronic kidney disease with stage 1 through stage 4 chronic kidney disease, or unspecified chronic kidney disease: Secondary | ICD-10-CM | POA: Diagnosis not present

## 2017-04-03 DIAGNOSIS — N529 Male erectile dysfunction, unspecified: Secondary | ICD-10-CM | POA: Diagnosis not present

## 2017-04-03 DIAGNOSIS — E291 Testicular hypofunction: Secondary | ICD-10-CM

## 2017-04-03 DIAGNOSIS — N182 Chronic kidney disease, stage 2 (mild): Secondary | ICD-10-CM | POA: Diagnosis not present

## 2017-04-03 DIAGNOSIS — Z125 Encounter for screening for malignant neoplasm of prostate: Secondary | ICD-10-CM | POA: Diagnosis not present

## 2017-04-03 DIAGNOSIS — E0840 Diabetes mellitus due to underlying condition with diabetic neuropathy, unspecified: Secondary | ICD-10-CM | POA: Diagnosis not present

## 2017-04-03 LAB — MICROALBUMIN, URINE WAIVED
CREATININE, URINE WAIVED: 200 mg/dL (ref 10–300)
Microalb, Ur Waived: 30 mg/L — ABNORMAL HIGH (ref 0–19)
Microalb/Creat Ratio: <30 mg/g (ref <30)

## 2017-04-03 LAB — UA/M W/RFLX CULTURE, ROUTINE
BILIRUBIN UA: NEGATIVE
KETONES UA: NEGATIVE
Leukocytes, UA: NEGATIVE
Nitrite, UA: NEGATIVE
PH UA: 7.5 (ref 5.0–7.5)
PROTEIN UA: NEGATIVE
RBC UA: NEGATIVE
SPEC GRAV UA: 1.02 (ref 1.005–1.030)
UUROB: 0.2 mg/dL (ref 0.2–1.0)

## 2017-04-03 LAB — MICROSCOPIC EXAMINATION
BACTERIA UA: NONE SEEN
RBC, UA: NONE SEEN /hpf (ref 0–?)
WBC UA: NONE SEEN /HPF (ref 0–?)

## 2017-04-03 LAB — BAYER DCA HB A1C WAIVED: HB A1C (BAYER DCA - WAIVED): 6.2 % (ref <7.0)

## 2017-04-03 MED ORDER — "BD INTEGRA SYRINGE 22G X 1-1/2"" 3 ML MISC": 1 refills | Status: DC

## 2017-04-03 MED ORDER — TESTOSTERONE CYPIONATE 200 MG/ML IM SOLN: 100.0000 mg | INTRAMUSCULAR | 2 refills | Status: DC

## 2017-04-03 MED ORDER — AMLODIPINE BESYLATE 10 MG PO TABS: 10.0000 mg | ORAL_TABLET | Freq: Every day | ORAL | 1 refills | Status: DC

## 2017-04-03 MED ORDER — LOSARTAN POTASSIUM 25 MG PO TABS: 12.5000 mg | ORAL_TABLET | Freq: Every day | ORAL | 1 refills | Status: DC

## 2017-04-03 MED ORDER — LEVOTHYROXINE SODIUM 150 MCG PO TABS: 150.0000 ug | ORAL_TABLET | Freq: Every day | ORAL | 3 refills | Status: DC

## 2017-04-03 MED ORDER — SILDENAFIL CITRATE 100 MG PO TABS: 100.0000 mg | ORAL_TABLET | Freq: Every day | ORAL | 12 refills | Status: DC | PRN

## 2017-04-03 MED ORDER — JANUMET XR 50-1000 MG PO TB24: 2.0000 | ORAL_TABLET | Freq: Every day | ORAL | 1 refills | Status: DC

## 2017-04-03 MED ORDER — ROSUVASTATIN CALCIUM 10 MG PO TABS: 10.0000 mg | ORAL_TABLET | Freq: Every day | ORAL | 1 refills | Status: DC

## 2017-04-03 MED ORDER — DULAGLUTIDE 0.75 MG/0.5ML ~~LOC~~ SOAJ
SUBCUTANEOUS | 3 refills | Status: DC
Start: 2017-04-03 — End: 2017-10-09

## 2017-04-03 MED ORDER — HYDROCHLOROTHIAZIDE 25 MG PO TABS: 25.0000 mg | ORAL_TABLET | Freq: Every day | ORAL | 1 refills | Status: DC

## 2017-04-03 MED ORDER — NEEDLE (DISP) 18G X 1" MISC
1 refills | Status: DC
Start: 2017-04-03 — End: 2018-07-09

## 2017-04-03 MED ORDER — FENOFIBRATE 160 MG PO TABS: 160.0000 mg | ORAL_TABLET | Freq: Every day | ORAL | 1 refills | Status: DC

## 2017-04-03 NOTE — Assessment & Plan Note (Signed)
Under good control. Continue current regimen. Continue to monitor.  

## 2017-04-03 NOTE — Assessment & Plan Note (Signed)
Checking urine again today. Await results.

## 2017-04-03 NOTE — Progress Notes (Signed)
BP 138/88   Pulse 98   Temp 97.9 F (36.6 C)   Ht 6' 0.4" (1.839 m)   Wt 218 lb 1.6 oz (98.9 kg)   SpO2 100%   BMI 29.25 kg/m    Subjective:    Patient ID: Manuel Burnett, male    DOB: 08-07-1956, 61 y.o.   MRN: [id number]  HPI: Manuel Burnett is a 61 y.o. male presenting on 04/03/2017 for comprehensive medical examination. Current medical complaints include: He has been working at night. He has been feeling tired. Due for testosterone shot today.  HYPERTENSION / HYPERLIPIDEMIA Satisfied with current treatment? yes Duration of hypertension: chronic BP monitoring frequency: rarely BP medication side effects: no Past BP meds: amlodipine, losartan, HCTZ Duration of hyperlipidemia: chronic Cholesterol medication side effects: no Cholesterol supplements: none Past cholesterol medications: crestor, fenofibrate Medication compliance: excellent compliance Aspirin: yes Recent stressors: yes Recurrent headaches: no Visual changes: no Palpitations: no Dyspnea: no Chest pain: no Lower extremity edema: no Dizzy/lightheaded: no  DIABETES Hypoglycemic episodes:no Polydipsia/polyuria: no Visual disturbance: no Chest pain: no Paresthesias: yes Glucose Monitoring: yes  Accucheck frequency: BID  Fasting glucose: 95 Taking Insulin?: no Blood Pressure Monitoring: rarely Retinal Examination: Up to Date Foot Exam: Up to Date Diabetic Education: Completed Pneumovax: Up to Date Influenza: Not up to Date Aspirin: yes  LOW TESTOSTERONE Duration: chronic Status: controlled  Satisfied with current treatment:  yes Medication side effects:  no Medication compliance: excellent compliance Decreased libido: yes Fatigue: yes Depressed mood: no Muscle weakness: no Erectile dysfunction: yes  HYPOTHYROIDISM Thyroid control status:controlled Satisfied with current treatment? yes Medication side effects: no Medication compliance: excellent compliance Recent dose  adjustment:no Fatigue: yes Cold intolerance: yes Heat intolerance: no Weight gain: no Weight loss: no Constipation: no Diarrhea/loose stools: no Palpitations: no Lower extremity edema: no Anxiety/depressed mood: no  He currently lives with: wife Interim Problems from his last visit: no  Depression Screen done today and results listed below:  Depression screen [facility] 2/9 04/03/2017 04/03/2017 02/02/2016  Decreased Interest 0 0 1  Down, Depressed, Hopeless 0 0 1  PHQ - 2 Score 0 0 2  Altered sleeping 0 - 0  Tired, decreased energy 0 - 1  Change in appetite 0 - 1  Feeling bad or failure about yourself  0 - 0  Trouble concentrating 0 - 0  Moving slowly or fidgety/restless 0 - 0  Suicidal thoughts 0 - 0  PHQ-9 Score 0 - 4  Difficult doing work/chores - - Somewhat difficult    Past Medical History:  Past Medical History:  Diagnosis Date  . Chronic kidney disease   . Diabetes mellitus without complication ([place])   . Erectile dysfunction   . Hyperlipidemia   . Hypertension   . Hypogonadism male   . Proteinuria   . Thyroid disease     Surgical History:  Past Surgical History:  Procedure Laterality Date  . CLAVICLE SURGERY     internal fixation  . TONSILLECTOMY AND ADENOIDECTOMY      Medications:  Current Outpatient Prescriptions on File Prior to Visit  Medication Sig  . glucose blood test strip Use as instructed  . ranitidine (ZANTAC) 150 MG tablet Take by mouth.   No current facility-administered medications on file prior to visit.     Allergies:  Allergies  Allergen Reactions  . Codeine Sulfate Rash    Social History:  Social History   Social History  . Marital status: Married    Spouse name: N/A  .  Number of children: N/A  . Years of education: N/A   Occupational History  . Not on file.   Social History Main Topics  . Smoking status: Current Every Day Smoker    Packs/day: 0.50  . Smokeless tobacco: Never Used  . Alcohol use 0.0 oz/week      Comment: 4-6 beers over the weekend  . Drug use: No  . Sexual activity: Not Currently   Other Topics Concern  . Not on file   Social History Narrative  . No narrative on file   History  Smoking Status  . Current Every Day Smoker  . Packs/day: 0.50  Smokeless Tobacco  . Never Used   History  Alcohol Use  . 0.0 oz/week    Comment: 4-6 beers over the weekend    Family History:  Family History  Problem Relation Age of Onset  . Hypertension Mother   . Heart disease Father   . Heart attack Father   . CAD Maternal Grandmother   . Cancer Maternal Grandfather     Past medical history, surgical history, medications, allergies, family history and social history reviewed with patient today and changes made to appropriate areas of the chart.   Review of Systems  Constitutional: Positive for diaphoresis. Negative for chills, malaise/fatigue and weight loss.  HENT: Negative.   Eyes: Negative.   Respiratory: Negative.   Cardiovascular: Negative.   Gastrointestinal: Positive for heartburn. Negative for abdominal pain, blood in stool, constipation, diarrhea, melena, nausea and vomiting.  Genitourinary: Negative.   Musculoskeletal: Negative.   Skin: Negative.   Neurological: Negative.  Negative for weakness.  Endo/Heme/Allergies: Positive for polydipsia. Negative for environmental allergies. Does not bruise/bleed easily.  Psychiatric/Behavioral: Negative.     All other ROS negative except what is listed above and in the HPI.      Objective:    BP 138/88   Pulse 98   Temp 97.9 F (36.6 C)   Ht 6' 0.4" (1.839 m)   Wt 218 lb 1.6 oz (98.9 kg)   SpO2 100%   BMI 29.25 kg/m   Wt Readings from Last 3 Encounters:  04/03/17 218 lb 1.6 oz (98.9 kg)  08/08/16 220 lb (99.8 kg)  05/11/16 218 lb (98.9 kg)    Physical Exam  Constitutional: He is oriented to person, place, and time. He appears well-developed and well-nourished. No distress.  HENT:  Head: Normocephalic and  atraumatic.  Right Ear: Hearing and external ear normal.  Left Ear: Hearing and external ear normal.  Nose: Nose normal.  Mouth/Throat: Oropharynx is clear and moist. No oropharyngeal exudate.  Eyes: Conjunctivae, EOM and lids are normal. Pupils are equal, round, and reactive to light. Right eye exhibits no discharge. Left eye exhibits no discharge. No scleral icterus.  Neck: Normal range of motion. Neck supple. No JVD present. No tracheal deviation present. No thyromegaly present.  Cardiovascular: Normal rate, regular rhythm, normal heart sounds and intact distal pulses.  Exam reveals no gallop and no friction rub.   No murmur heard. Pulmonary/Chest: Effort normal and breath sounds normal. No respiratory distress. He has no wheezes. He has no rales. He exhibits no tenderness.  Abdominal: Soft. Bowel sounds are normal. He exhibits no distension and no mass. There is no tenderness. There is no rebound and no guarding. Hernia confirmed negative in the right inguinal area and confirmed negative in the left inguinal area.  Genitourinary: Rectum normal, prostate normal, testes normal and penis normal. Rectal exam shows no external hemorrhoid, no internal  hemorrhoid, no fissure, no mass, no tenderness and anal tone normal. Prostate is not enlarged and not tender. Right testis shows no mass, no swelling and no tenderness. Right testis is descended. Cremasteric reflex is not absent on the right side. Left testis shows no mass, no swelling and no tenderness. Left testis is descended. Cremasteric reflex is not absent on the left side. Circumcised. No penile tenderness.  Musculoskeletal: Normal range of motion. He exhibits no edema, tenderness or deformity.  Lymphadenopathy:    He has no cervical adenopathy.  Neurological: He is alert and oriented to person, place, and time. He has normal reflexes. He displays normal reflexes. No cranial nerve deficit. He exhibits normal muscle tone. Coordination normal.  Skin:  Skin is warm, dry and intact. No rash noted. He is not diaphoretic. No erythema. No pallor.  Psychiatric: He has a normal mood and affect. His speech is normal and behavior is normal. Judgment and thought content normal. Cognition and memory are normal.  Nursing note and vitals reviewed.   Diabetic Foot Exam - Simple   Simple Foot Form Diabetic Foot exam was performed with the following findings:  Yes 04/03/2017 10:36 AM  Visual Inspection No deformities, no ulcerations, no other skin breakdown bilaterally:  Yes Sensation Testing Intact to touch and monofilament testing bilaterally:  Yes Pulse Check Posterior Tibialis and Dorsalis pulse intact bilaterally:  Yes Comments      Results for orders placed or performed in visit on 08/08/16  Comprehensive metabolic panel  Result Value Ref Range   Glucose 123 (H) 65 - 99 mg/dL   BUN 13 6 - 24 mg/dL   Creatinine, Ser 1.17 0.76 - 1.27 mg/dL   GFR calc non Af Amer 68 >59 mL/min/1.73   GFR calc Af Amer 79 >59 mL/min/1.73   BUN/Creatinine Ratio 11 9 - 20   Sodium 143 134 - 144 mmol/L   Potassium 3.7 3.5 - 5.2 mmol/L   Chloride 101 96 - 106 mmol/L   CO2 25 18 - 29 mmol/L   Calcium 10.0 8.7 - 10.2 mg/dL   Total Protein 6.9 6.0 - 8.5 g/dL   Albumin 4.5 3.5 - 5.5 g/dL   Globulin, Total 2.4 1.5 - 4.5 g/dL   Albumin/Globulin Ratio 1.9 1.2 - 2.2   Bilirubin Total 0.4 0.0 - 1.2 mg/dL   Alkaline Phosphatase 43 39 - 117 IU/L   AST 15 0 - 40 IU/L   ALT 12 0 - 44 IU/L  Lipid Panel Piccolo, Waived  Result Value Ref Range   Cholesterol Piccolo, Waived 146 <200 mg/dL   HDL Chol Piccolo, Waived 46 (L) >59 mg/dL   Triglycerides Piccolo,Waived 263 (H) <150 mg/dL   Chol/HDL Ratio Piccolo,Waive 3.2 mg/dL   LDL Chol Calc Piccolo Waived 47 <100 mg/dL   VLDL Chol Calc Piccolo,Waive 53 (H) <30 mg/dL  Bayer DCA Hb A1c Waived  Result Value Ref Range   Bayer DCA Hb A1c Waived 6.6 <7.0 %  Microalbumin, Urine Waived  Result Value Ref Range   Microalb, Ur  Waived 30 (H) 0 - 19 mg/L   Creatinine, Urine Waived 50 10 - 300 mg/dL   Microalb/Creat Ratio 30-300 (H) <30 mg/g  TSH  Result Value Ref Range   TSH 2.780 0.450 - 4.500 uIU/mL  UA/M w/rflx Culture, Routine  Result Value Ref Range   Specific Gravity, UA 1.010 1.005 - 1.030   pH, UA 6.5 5.0 - 7.5   Color, UA Yellow Yellow   Appearance Ur Clear Clear  Leukocytes, UA Negative Negative   Protein, UA Negative Negative/Trace   Glucose, UA 2+ (A) Negative   Ketones, UA Negative Negative   RBC, UA Negative Negative   Bilirubin, UA Negative Negative   Urobilinogen, Ur 0.2 0.2 - 1.0 mg/dL   Nitrite, UA Negative Negative  Testosterone, free, total  Result Value Ref Range   Testosterone 411 264 - 916 ng/dL   Testosterone, Free 7.1 (L) 7.2 - 24.0 pg/mL   Sex Hormone Binding 45.6 19.3 - 76.4 nmol/L      Assessment & Plan:   Problem List Items Addressed This Visit      Endocrine   Diabetes mellitus due to underlying condition with diabetic neuropathy ([place])    Under good control. A1c came back today at 6.2. Continue current regimen. Continue to monitor.       Relevant Medications   aspirin EC 81 MG tablet   Dulaglutide (TRULICITY) 7.85 YI/5.0YD SOPN   JANUMET XR 50-1000 MG TB24   losartan (COZAAR) 25 MG tablet   rosuvastatin (CRESTOR) 10 MG tablet   Other Relevant Orders   Bayer DCA Hb A1c Waived   Comprehensive metabolic panel   Male hypogonadism    Under good control. Continue current regimen. Continue to monitor.       Relevant Orders   CBC with Differential/Platelet   Comprehensive metabolic panel   Testosterone, free, total   Hypothyroidism    Under good control. Continue current regimen. Continue to monitor.       Relevant Medications   levothyroxine (SYNTHROID, LEVOTHROID) 150 MCG tablet   Other Relevant Orders   Comprehensive metabolic panel   PSA   TSH   Secondary hyperparathyroidism ([place])    Under good control. Continue current regimen. Continue to monitor.          Genitourinary   Benign hypertensive renal disease    Under good control on recheck. Continue current regimen. Continue to monitor.       Relevant Orders   CBC with Differential/Platelet   Comprehensive metabolic panel   Microalbumin, Urine Waived   Chronic kidney disease, stage II (mild)    Under good control. Continue current regimen. Continue to monitor.       Relevant Orders   CBC with Differential/Platelet   Comprehensive metabolic panel   Microalbumin, Urine Waived   ED (erectile dysfunction)    Under good control. Continue current regimen. Continue to monitor.         Other   Hyperlipemia    Rechecking levels. Await results. Continue current regimen. Adjust medication as needed. Call with any concerns.       Relevant Medications   Choline Fenofibrate (FENOFIBRIC ACID) 135 MG CPDR   aspirin EC 81 MG tablet   amLODipine (NORVASC) 10 MG tablet   fenofibrate 160 MG tablet   hydrochlorothiazide (HYDRODIURIL) 25 MG tablet   losartan (COZAAR) 25 MG tablet   rosuvastatin (CRESTOR) 10 MG tablet   sildenafil (VIAGRA) 100 MG tablet   Other Relevant Orders   Comprehensive metabolic panel   Lipid Panel w/o Chol/HDL Ratio   Proteinuria    Checking urine again today. Await results.       Relevant Orders   Comprehensive metabolic panel   Microalbumin, Urine Waived   UA/M w/rflx Culture, Routine    Other Visit Diagnoses    Routine general medical examination at a health care facility    -  Primary   Vaccines up to date. Screening labs checked today. Colonoscopy up to  date. Continue diet and exercise. Recheck 6 months.    Screening for prostate cancer       Labs drawn today. Await results.    Relevant Orders   PSA       Discussed aspirin prophylaxis for myocardial infarction prevention and decision was made to continue ASA  LABORATORY TESTING:  Health maintenance labs ordered today as discussed above.   The natural history of prostate cancer and ongoing  controversy regarding screening and potential treatment outcomes of prostate cancer has been discussed with the patient. The meaning of a false positive PSA and a false negative PSA has been discussed. He indicates understanding of the limitations of this screening test and wishes to proceed with screening PSA testing.   IMMUNIZATIONS:   - Tdap: Tetanus vaccination status reviewed: last tetanus booster within 10 years. - Influenza: Postponed to flu season - Pneumovax: Up to date - Prevnar: Not applicable - Zostavax vaccine: Will wait until 62  SCREENING: - Colonoscopy: Up to date  Discussed with patient purpose of the colonoscopy is to detect colon cancer at curable precancerous or early stages   PATIENT COUNSELING:    Sexuality: Discussed sexually transmitted diseases, partner selection, use of condoms, avoidance of unintended pregnancy  and contraceptive alternatives.   Advised to avoid cigarette smoking.  I discussed with the patient that most people either abstain from alcohol or drink within safe limits (<=14/week and <=4 drinks/occasion for males, <=7/weeks and <= 3 drinks/occasion for females) and that the risk for alcohol disorders and other health effects rises proportionally with the number of drinks per week and how often a drinker exceeds daily limits.  Discussed cessation/primary prevention of drug use and availability of treatment for abuse.   Diet: Encouraged to adjust caloric intake to maintain  or achieve ideal body weight, to reduce intake of dietary saturated fat and total fat, to limit sodium intake by avoiding high sodium foods and not adding table salt, and to maintain adequate dietary potassium and calcium preferably from fresh fruits, vegetables, and low-fat dairy products.    stressed the importance of regular exercise  Injury prevention: Discussed safety belts, safety helmets, smoke detector, smoking near bedding or upholstery.   Dental health: Discussed  importance of regular tooth brushing, flossing, and dental visits.   Follow up plan: NEXT PREVENTATIVE PHYSICAL DUE IN 1 YEAR. Return in about 6 months (around 10/03/2017) for DM/Cholesterol/BP follow up.

## 2017-04-03 NOTE — Assessment & Plan Note (Addendum)
Under good control on recheck. Continue current regimen. Continue to monitor.

## 2017-04-03 NOTE — Telephone Encounter (Signed)
Patient called to give another medication he forgot to list this morning.  Trulicity does he continue or discontinue  Thanks  [phone]

## 2017-04-03 NOTE — Patient Instructions (Addendum)
 Health Maintenance, Male A healthy lifestyle and preventive care is important for your health and wellness. Ask your health care provider about what schedule of regular examinations is right for you. What should I know about weight and diet?  Eat a Healthy Diet  Eat plenty of vegetables, fruits, whole grains, low-fat dairy products, and lean protein.  Do not eat a lot of foods high in solid fats, added sugars, or salt. Maintain a Healthy Weight  Regular exercise can help you achieve or maintain a healthy weight. You should:  Do at least 150 minutes of exercise each week. The exercise should increase your heart rate and make you sweat (moderate-intensity exercise).  Do strength-training exercises at least twice a week. Watch Your Levels of Cholesterol and Blood Lipids  Have your blood tested for lipids and cholesterol every 5 years starting at 59 years of age. If you are at high risk for heart disease, you should start having your blood tested when you are 59 years old. You may need to have your cholesterol levels checked more often if:  Your lipid or cholesterol levels are high.  You are older than 59 years of age.  You are at high risk for heart disease. What should I know about cancer screening? Many types of cancers can be detected early and may often be prevented. Lung Cancer  You should be screened every year for lung cancer if:  You are a current smoker who has smoked for at least 30 years.  You are a former smoker who has quit within the past 15 years.  Talk to your health care provider about your screening options, when you should start screening, and how often you should be screened. Colorectal Cancer  Routine colorectal cancer screening usually begins at 59 years of age and should be repeated every 5-10 years until you are 59 years old. You may need to be screened more often if early forms of precancerous polyps or small growths are found. Your health care provider  may recommend screening at an earlier age if you have risk factors for colon cancer.  Your health care provider may recommend using home test kits to check for hidden blood in the stool.  A small camera at the end of a tube can be used to examine your colon (sigmoidoscopy or colonoscopy). This checks for the earliest forms of colorectal cancer. Prostate and Testicular Cancer  Depending on your age and overall health, your health care provider may do certain tests to screen for prostate and testicular cancer.  Talk to your health care provider about any symptoms or concerns you have about testicular or prostate cancer. Skin Cancer  Check your skin from head to toe regularly.  Tell your health care provider about any new moles or changes in moles, especially if:  There is a change in a mole's size, shape, or color.  You have a mole that is larger than a pencil eraser.  Always use sunscreen. Apply sunscreen liberally and repeat throughout the day.  Protect yourself by wearing long sleeves, pants, a wide-brimmed hat, and sunglasses when outside. What should I know about heart disease, diabetes, and high blood pressure?  If you are 18-39 years of age, have your blood pressure checked every 3-5 years. If you are 40 years of age or older, have your blood pressure checked every year. You should have your blood pressure measured twice-once when you are at a hospital or clinic, and once when you are not at   a hospital or clinic. Record the average of the two measurements. To check your blood pressure when you are not at a hospital or clinic, you can use:  An automated blood pressure machine at a pharmacy.  A home blood pressure monitor.  Talk to your health care provider about your target blood pressure.  If you are between 45-79 years old, ask your health care provider if you should take aspirin to prevent heart disease.  Have regular diabetes screenings by checking your fasting blood sugar  level.  If you are at a normal weight and have a low risk for diabetes, have this test once every three years after the age of 45.  If you are overweight and have a high risk for diabetes, consider being tested at a younger age or more often.  A one-time screening for abdominal aortic aneurysm (AAA) by ultrasound is recommended for men aged 65-75 years who are current or former smokers. What should I know about preventing infection? Hepatitis B  If you have a higher risk for hepatitis B, you should be screened for this virus. Talk with your health care provider to find out if you are at risk for hepatitis B infection. Hepatitis C  Blood testing is recommended for:  Everyone born from 1945 through 1965.  Anyone with known risk factors for hepatitis C. Sexually Transmitted Diseases (STDs)  You should be screened each year for STDs including gonorrhea and chlamydia if:  You are sexually active and are younger than 59 years of age.  You are older than 59 years of age and your health care provider tells you that you are at risk for this type of infection.  Your sexual activity has changed since you were last screened and you are at an increased risk for chlamydia or gonorrhea. Ask your health care provider if you are at risk.  Talk with your health care provider about whether you are at high risk of being infected with HIV. Your health care provider may recommend a prescription medicine to help prevent HIV infection. What else can I do?  Schedule regular health, dental, and eye exams.  Stay current with your vaccines (immunizations).  Do not use any tobacco products, such as cigarettes, chewing tobacco, and e-cigarettes. If you need help quitting, ask your health care provider.  Limit alcohol intake to no more than 2 drinks per day. One drink equals 12 ounces of beer, 5 ounces of wine, or 1 ounces of hard liquor.  Do not use street drugs.  Do not share needles.  Ask your health  care provider for help if you need support or information about quitting drugs.  Tell your health care provider if you often feel depressed.  Tell your health care provider if you have ever been abused or do not feel safe at home. This information is not intended to replace advice given to you by your health care provider. Make sure you discuss any questions you have with your health care provider. Document Released: 06/02/2008 Document Revised: 08/03/2016 Document Reviewed: 09/08/2015 Elsevier Interactive Patient Education  2017 [employer]  

## 2017-04-03 NOTE — Assessment & Plan Note (Addendum)
Under good control. A1c came back today at 6.2. Continue current regimen. Continue to monitor.

## 2017-04-03 NOTE — Assessment & Plan Note (Signed)
Rechecking levels. Await results. Continue current regimen. Adjust medication as needed. Call with any concerns.

## 2017-04-04 LAB — PSA: PROSTATE SPECIFIC AG, SERUM: 2.2 ng/mL (ref 0.0–4.0)

## 2017-04-04 LAB — CBC WITH DIFFERENTIAL/PLATELET
BASOS ABS: 0 10*3/uL (ref 0.0–0.2)
Basos: 1 %
EOS (ABSOLUTE): 0.3 10*3/uL (ref 0.0–0.4)
Eos: 4 %
Hematocrit: 41.6 % (ref 37.5–51.0)
Hemoglobin: 14.7 g/dL (ref 13.0–17.7)
IMMATURE GRANS (ABS): 0 10*3/uL (ref 0.0–0.1)
IMMATURE GRANULOCYTES: 0 %
LYMPHS: 29 %
Lymphocytes Absolute: 1.7 10*3/uL (ref 0.7–3.1)
MCH: 29.9 pg (ref 26.6–33.0)
MCHC: 35.3 g/dL (ref 31.5–35.7)
MCV: 85 fL (ref 79–97)
MONOCYTES: 10 %
Monocytes Absolute: 0.6 10*3/uL (ref 0.1–0.9)
NEUTROS ABS: 3.4 10*3/uL (ref 1.4–7.0)
Neutrophils: 56 %
PLATELETS: 169 10*3/uL (ref 150–379)
RBC: 4.91 x10E6/uL (ref 4.14–5.80)
RDW: 13.2 % (ref 12.3–15.4)
WBC: 6 10*3/uL (ref 3.4–10.8)

## 2017-04-04 LAB — COMPREHENSIVE METABOLIC PANEL
ALT: 11 IU/L (ref 0–44)
AST: 18 IU/L (ref 0–40)
Albumin/Globulin Ratio: 2 (ref 1.2–2.2)
Albumin: 4.7 g/dL (ref 3.5–5.5)
Alkaline Phosphatase: 44 IU/L (ref 39–117)
BUN/Creatinine Ratio: 16 (ref 9–20)
BUN: 18 mg/dL (ref 6–24)
Bilirubin Total: 0.4 mg/dL (ref 0.0–1.2)
CALCIUM: 10.1 mg/dL (ref 8.7–10.2)
CO2: 27 mmol/L (ref 18–29)
CREATININE: 1.12 mg/dL (ref 0.76–1.27)
Chloride: 100 mmol/L (ref 96–106)
GFR, EST AFRICAN AMERICAN: 83 mL/min/{1.73_m2} (ref >59)
GFR, EST NON AFRICAN AMERICAN: 72 mL/min/{1.73_m2} (ref >59)
Globulin, Total: 2.4 g/dL (ref 1.5–4.5)
Glucose: 119 mg/dL — ABNORMAL HIGH (ref 65–99)
Potassium: 4.5 mmol/L (ref 3.5–5.2)
Sodium: 144 mmol/L (ref 134–144)
TOTAL PROTEIN: 7.1 g/dL (ref 6.0–8.5)

## 2017-04-04 LAB — LIPID PANEL W/O CHOL/HDL RATIO
Cholesterol, Total: 140 mg/dL (ref 100–199)
HDL: 40 mg/dL (ref >39)
LDL CALC: 65 mg/dL (ref 0–99)
TRIGLYCERIDES: 173 mg/dL — AB (ref 0–149)
VLDL CHOLESTEROL CAL: 35 mg/dL (ref 5–40)

## 2017-04-04 LAB — TESTOSTERONE, FREE, TOTAL, SHBG
SEX HORMONE BINDING: 48.9 nmol/L (ref 19.3–76.4)
Testosterone, Free: 5.4 pg/mL — ABNORMAL LOW (ref 7.2–24.0)
Testosterone: 369 ng/dL (ref 264–916)

## 2017-04-04 LAB — TSH: TSH: 2.26 u[IU]/mL (ref 0.450–4.500)

## 2017-05-01 LAB — HM DIABETES EYE EXAM

## 2017-05-25 IMAGING — CR DG FEMUR 2+V*R*
6 series · 6 of 6 positions shown · non-contrast
Comparison: None.

CLINICAL DATA: Walked into metal post at gas station. Injury
occurred 10 days ago. Worsened pain and swelling.

EXAM:
RIGHT FEMUR 2 VIEWS

[femur ap (1 of 3)]
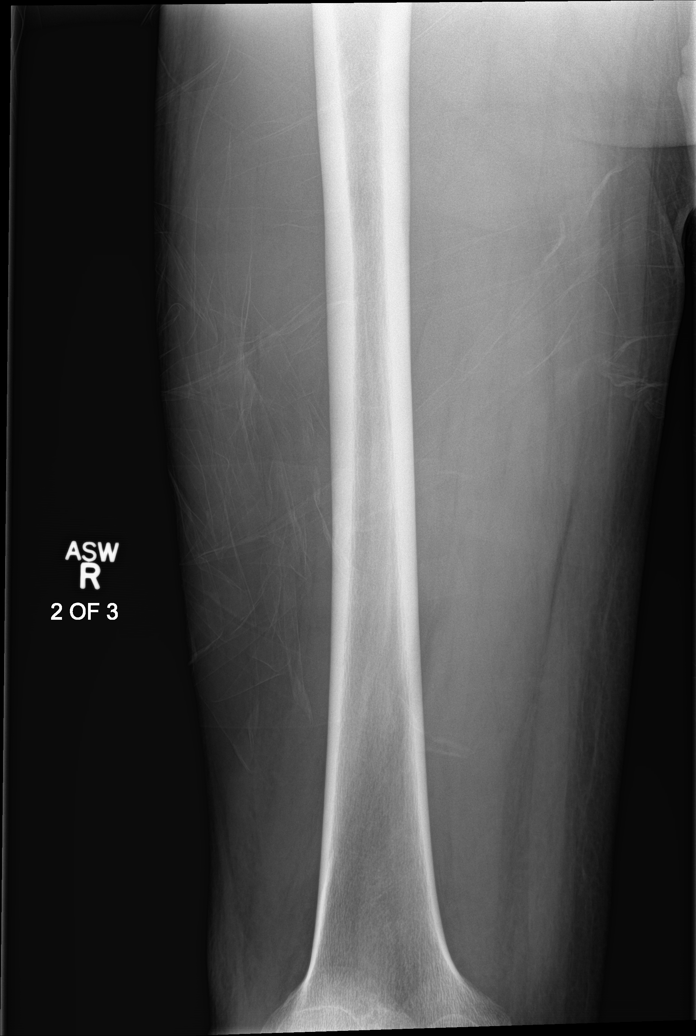

[femur ap (2 of 3)]
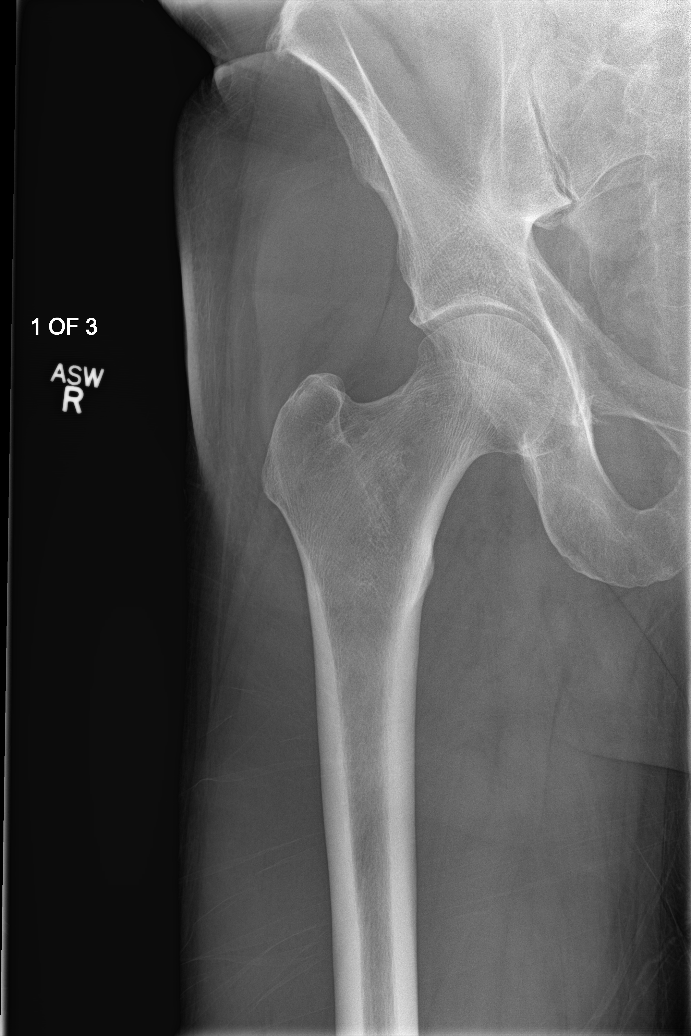

[femur lat (1 of 3)]
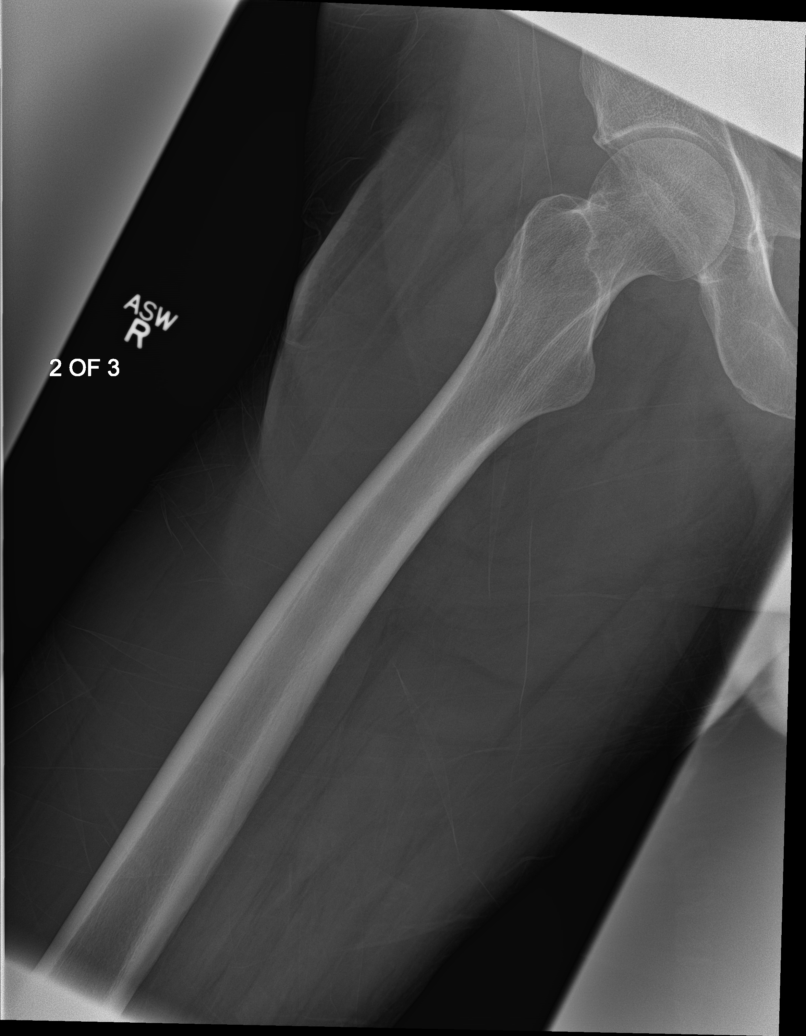

[femur lat (2 of 3)]
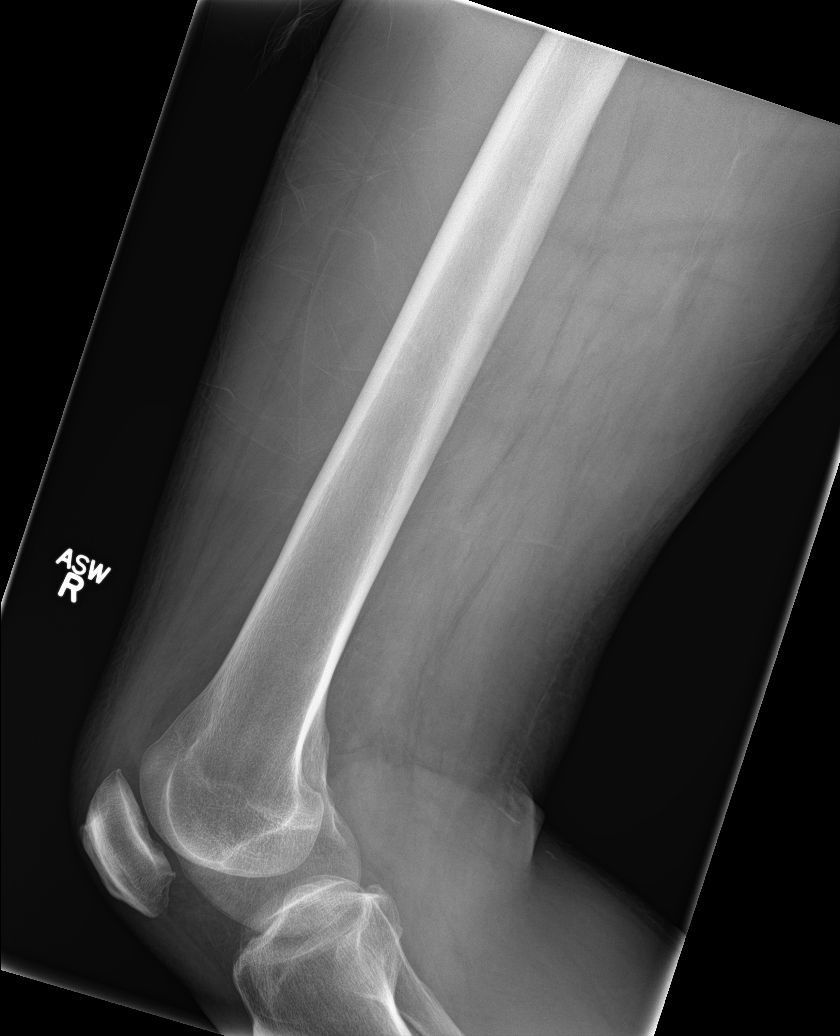

[femur ap (3 of 3)]
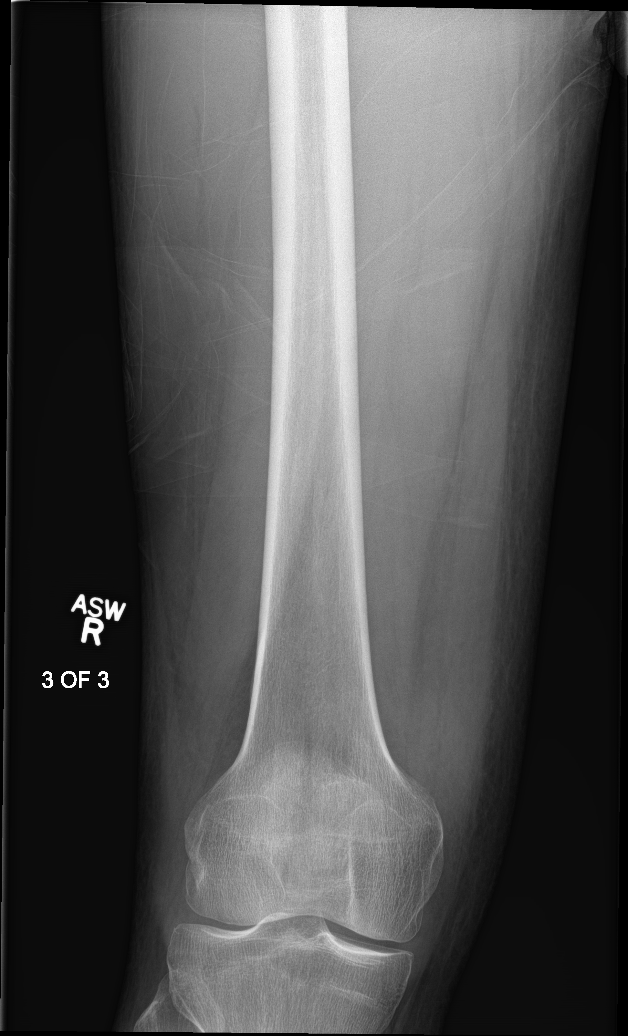

[femur lat (3 of 3)]
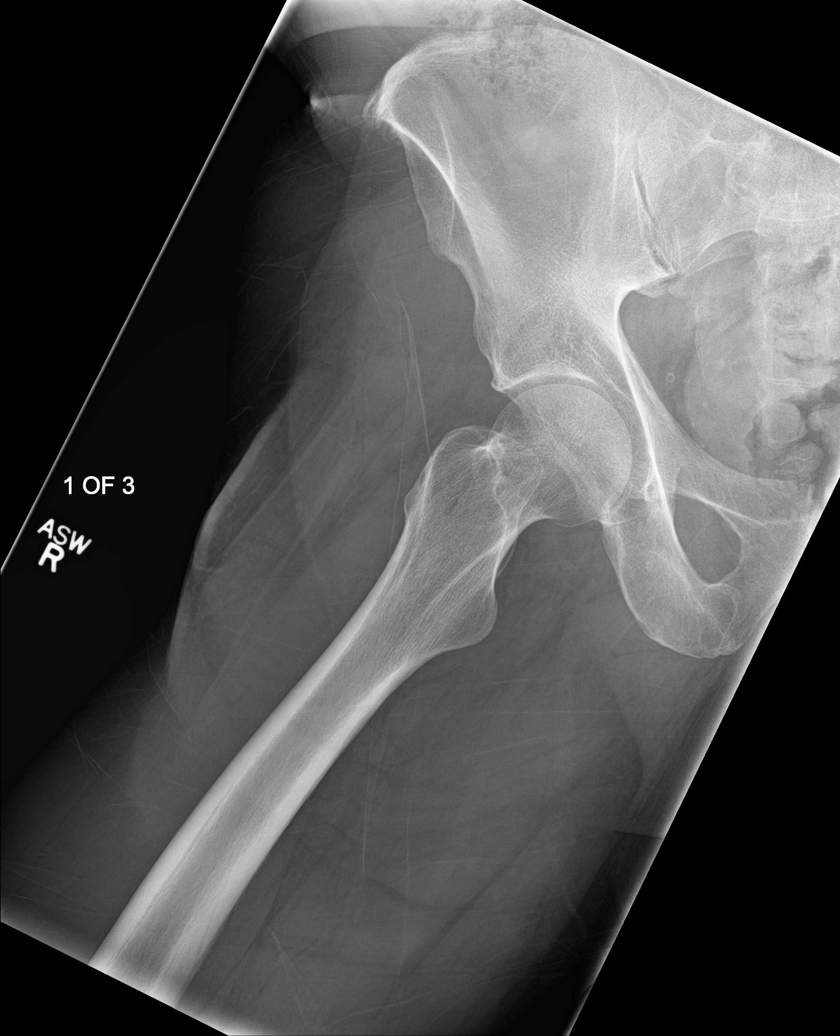

[6 of 6 positions shown; findings below may reference images not displayed]

FINDINGS: There is no evidence of fracture or other focal bone lesions. Soft
tissues are unremarkable.
IMPRESSION: Normal radiographs

## 2017-08-15 ENCOUNTER — Ambulatory Visit
Admission: EM | Admit: 2017-08-15 | Discharge: 2017-08-15 | Disposition: A | Discharge status: Home / Self Care | Payer: Managed Care, Other (non HMO) | Attending: Family Medicine | Admitting: Family Medicine

## 2017-08-15 DIAGNOSIS — R05 Cough: Secondary | ICD-10-CM

## 2017-08-15 DIAGNOSIS — J9801 Acute bronchospasm: Secondary | ICD-10-CM | POA: Diagnosis not present

## 2017-08-15 DIAGNOSIS — R059 Cough, unspecified: Secondary | ICD-10-CM

## 2017-08-15 MED ORDER — ALBUTEROL SULFATE HFA 108 (90 BASE) MCG/ACT IN AERS: 1.0000 | INHALATION_SPRAY | Freq: Four times a day (QID) | RESPIRATORY_TRACT | 0 refills | Status: DC | PRN

## 2017-08-15 MED ORDER — PREDNISONE 20 MG PO TABS: 20.0000 mg | ORAL_TABLET | Freq: Every day | ORAL | 0 refills | Status: DC

## 2017-08-15 MED ORDER — IPRATROPIUM-ALBUTEROL 0.5-2.5 (3) MG/3ML IN SOLN
3.0000 mL | Freq: Once | RESPIRATORY_TRACT | Status: AC
  Administered 2017-08-15: 3 mL via RESPIRATORY_TRACT

## 2017-08-15 MED ORDER — DOXYCYCLINE HYCLATE 100 MG PO TABS: 100.0000 mg | ORAL_TABLET | Freq: Two times a day (BID) | ORAL | 0 refills | Status: DC

## 2017-08-15 NOTE — ED Provider Notes (Signed)
[facility]    CSN: [id number] Arrival date & time: 08/15/17  0950     History   Chief Complaint Chief Complaint  Patient presents with  . Cough    HPI Manuel SERMERSHEIM is a 61 y.o. male.   The history is provided by the patient.  Cough  Associated symptoms: fever and wheezing   URI  Presenting symptoms: congestion, cough, fatigue and fever   Severity:  Moderate Onset quality:  Sudden Duration:  1 week Timing:  Constant Progression:  Worsening Chronicity:  New Relieved by:  Nothing Ineffective treatments:  OTC medications Associated symptoms: wheezing   Risk factors: chronic respiratory disease (sleep apnea) and diabetes mellitus   Risk factors: not elderly, no chronic cardiac disease, no chronic kidney disease, no immunosuppression, no recent illness, no recent travel and no sick contacts     Past Medical History:  Diagnosis Date  . Chronic kidney disease   . Diabetes mellitus without complication ([place])   . Erectile dysfunction   . Hyperlipidemia   . Hypertension   . Hypogonadism male   . Proteinuria   . Thyroid disease     Patient Active Problem List   Diagnosis Date Noted  . Benign hypertensive renal disease 08/03/2015  . Hyperlipemia 08/03/2015  . Diabetes mellitus due to underlying condition with diabetic neuropathy ([place]) 08/03/2015  . Chronic kidney disease, stage II (mild) 08/03/2015  . Male hypogonadism 08/03/2015  . Hypothyroidism 08/03/2015  . Proteinuria 08/03/2015  . ED (erectile dysfunction) 08/03/2015  . OSA on CPAP 08/03/2015  . Secondary hyperparathyroidism ([place]) 08/03/2015  . Diverticulosis 08/03/2015    Past Surgical History:  Procedure Laterality Date  . CLAVICLE SURGERY     internal fixation  . TONSILLECTOMY AND ADENOIDECTOMY         Home Medications    Prior to Admission medications   Medication Sig Start Date End Date Taking? Authorizing Provider  amLODipine (NORVASC) 10 MG tablet Take 1 tablet (10 mg  total) by mouth daily. 04/03/17  Yes Johnson, Megan P, DO  aspirin EC 81 MG tablet Take by mouth.   Yes [provider]  B-D INTEGRA SYRINGE 22G X 1-1/2" 3 ML MISC Inject testosterone 1x every 2 weeks 04/03/17  Yes Johnson, Megan P, DO  Choline Fenofibrate (FENOFIBRIC ACID) 135 MG CPDR Take by mouth.   Yes [provider]  Dulaglutide (TRULICITY) 5.63 OV/5.6EP SOPN inject 0.75 milligrams subcutaneously every week 04/03/17  Yes Johnson, Megan P, DO  fenofibrate 160 MG tablet Take 1 tablet (160 mg total) by mouth daily. 04/03/17  Yes Johnson, Megan P, DO  glucose blood test strip Use as instructed 08/08/16  Yes Johnson, Megan P, DO  hydrochlorothiazide (HYDRODIURIL) 25 MG tablet Take 1 tablet (25 mg total) by mouth daily. 04/03/17  Yes Johnson, Megan P, DO  JANUMET XR 50-1000 MG TB24 Take 2 tablets by mouth daily. 04/03/17  Yes Johnson, Megan P, DO  levothyroxine (SYNTHROID, LEVOTHROID) 150 MCG tablet Take 1 tablet (150 mcg total) by mouth daily. 04/03/17  Yes Johnson, Megan P, DO  losartan (COZAAR) 25 MG tablet Take 0.5 tablets (12.5 mg total) by mouth daily. 04/03/17  Yes Johnson, Megan P, DO  NEEDLE, DISP, 18 G (YALE DISP NEEDLES 18GX1") 18G X 1" MISC Inject testosterone every 2 weeks as directed 04/03/17  Yes Johnson, Megan P, DO  ranitidine (ZANTAC) 150 MG tablet Take by mouth.   Yes [provider]  rosuvastatin (CRESTOR) 10 MG tablet Take 1 tablet (10  mg total) by mouth daily. 04/03/17  Yes Johnson, Megan P, DO  sildenafil (VIAGRA) 100 MG tablet Take 1 tablet (100 mg total) by mouth daily as needed for erectile dysfunction. 04/03/17  Yes Johnson, Megan P, DO  testosterone cypionate (DEPOTESTOSTERONE CYPIONATE) 200 MG/ML injection Inject 0.5 mLs (100 mg total) into the muscle every 14 (fourteen) days. 04/03/17  Yes Johnson, Megan P, DO  albuterol (PROVENTIL HFA;VENTOLIN HFA) 108 (90 Base) MCG/ACT inhaler Inhale 1-2 puffs into the lungs every 6 (six) hours as needed for wheezing or  shortness of breath. 08/15/17   Norval Gable, MD  doxycycline (VIBRA-TABS) 100 MG tablet Take 1 tablet (100 mg total) by mouth 2 (two) times daily. 08/15/17   Norval Gable, MD  predniSONE (DELTASONE) 20 MG tablet Take 1 tablet (20 mg total) by mouth daily. 08/15/17   Norval Gable, MD    Family History Family History  Problem Relation Age of Onset  . Hypertension Mother   . Heart disease Father   . Heart attack Father   . CAD Maternal Grandmother   . Cancer Maternal Grandfather     Social History Social History  Substance Use Topics  . Smoking status: Current Every Day Smoker    Packs/day: 0.50  . Smokeless tobacco: Never Used  . Alcohol use 0.0 oz/week     Comment: 4-6 beers over the weekend     Allergies   Codeine sulfate   Review of Systems Review of Systems  Constitutional: Positive for fatigue and fever.  HENT: Positive for congestion.   Respiratory: Positive for cough and wheezing.      Physical Exam Triage Vital Signs ED Triage Vitals  Enc Vitals Group     BP 08/15/17 1015 (!) 154/90     Pulse Rate 08/15/17 1015 66     Resp 08/15/17 1015 17     Temp 08/15/17 1015 98.2 F (36.8 C)     Temp Source 08/15/17 1015 Oral     SpO2 08/15/17 1015 97 %     Weight 08/15/17 1013 215 lb (97.5 kg)     Height 08/15/17 1013 6\' 1"  (1.854 m)     Head Circumference --      Peak Flow --      Pain Score 08/15/17 1013 4     Pain Loc --      Pain Edu? --      Excl. in [place]? --    No data found.   Updated Vital Signs BP (!) 154/90 (BP Location: Left Arm)   Pulse 66   Temp 98.2 F (36.8 C) (Oral)   Resp 17   Ht 6\' 1"  (1.854 m)   Wt 215 lb (97.5 kg)   SpO2 97%   BMI 28.37 kg/m   Visual Acuity Right Eye Distance:   Left Eye Distance:   Bilateral Distance:    Right Eye Near:   Left Eye Near:    Bilateral Near:     Physical Exam  Constitutional: He appears well-developed and well-nourished. No distress.  HENT:  Head: Normocephalic and atraumatic.  Right  Ear: Tympanic membrane, external ear and ear canal normal.  Left Ear: Tympanic membrane, external ear and ear canal normal.  Nose: Nose normal.  Mouth/Throat: Uvula is midline, oropharynx is clear and moist and mucous membranes are normal. No oropharyngeal exudate or tonsillar abscesses.  Eyes: Pupils are equal, round, and reactive to light. Conjunctivae and EOM are normal. Right eye exhibits no discharge. Left eye exhibits no discharge. No  scleral icterus.  Neck: Normal range of motion. Neck supple. No tracheal deviation present. No thyromegaly present.  Cardiovascular: Normal rate, regular rhythm and normal heart sounds.   Pulmonary/Chest: Effort normal. No stridor. No respiratory distress. He has wheezes (diffuse, expiratory, bilaterally). He has no rales. He exhibits no tenderness.  Lymphadenopathy:    He has no cervical adenopathy.  Neurological: He is alert.  Skin: Skin is warm and dry. No rash noted. He is not diaphoretic.  Nursing note and vitals reviewed.    UC Treatments / Results  Labs (all labs ordered are listed, but only abnormal results are displayed) Labs Reviewed - No data to display  EKG  EKG Interpretation None       Radiology No results found.  Procedures Procedures (including critical care time)  Medications Ordered in UC Medications  ipratropium-albuterol (DUONEB) 0.5-2.5 (3) MG/3ML nebulizer solution 3 mL (3 mLs Nebulization Given 08/15/17 1100)     Initial Impression / Assessment and Plan / UC Course  I have reviewed the triage vital signs and the nursing notes.  Pertinent labs & imaging results that were available during my care of the patient were reviewed by me and considered in my medical decision making (see chart for details).       Final Clinical Impressions(s) / UC Diagnoses   Final diagnoses:  Cough  Bronchospasm    New Prescriptions Discharge Medication List as of 08/15/2017 11:10 AM    START taking these medications    Details  albuterol (PROVENTIL HFA;VENTOLIN HFA) 108 (90 Base) MCG/ACT inhaler Inhale 1-2 puffs into the lungs every 6 (six) hours as needed for wheezing or shortness of breath., Starting Tue 08/15/2017, Normal    doxycycline (VIBRA-TABS) 100 MG tablet Take 1 tablet (100 mg total) by mouth 2 (two) times daily., Starting Tue 08/15/2017, Normal    predniSONE (DELTASONE) 20 MG tablet Take 1 tablet (20 mg total) by mouth daily., Starting Tue 08/15/2017, Normal       1. diagnosis reviewed with patient; given duoneb x1 with improvement of symptoms 2. rx as per orders above; reviewed possible side effects, interactions, risks and benefits  3. Recommend supportive treatment with rest, fluids 4. Follow-up prn if symptoms worsen or don't improve  Controlled Substance Prescriptions [place] Controlled Substance Registry consulted? Not Applicable   Norval Gable, MD 08/15/17 1154

## 2017-08-15 NOTE — ED Triage Notes (Signed)
Patient complains of cough, low grade fever, chills, sweats, wheezing and congestion. Patient states that symptoms started on Thursday.

## 2017-09-08 ENCOUNTER — Other Ambulatory Visit: Payer: Self-pay | Admitting: Family Medicine

## 2017-10-09 ENCOUNTER — Encounter: Payer: Self-pay | Admitting: Family Medicine

## 2017-10-09 ENCOUNTER — Ambulatory Visit (INDEPENDENT_AMBULATORY_CARE_PROVIDER_SITE_OTHER): Payer: Managed Care, Other (non HMO) | Admitting: Family Medicine

## 2017-10-09 VITALS — BP 158/84 | HR 58 | Temp 98.6°F | Wt 218.6 lb

## 2017-10-09 DIAGNOSIS — J209 Acute bronchitis, unspecified: Secondary | ICD-10-CM | POA: Diagnosis not present

## 2017-10-09 DIAGNOSIS — E0840 Diabetes mellitus due to underlying condition with diabetic neuropathy, unspecified: Secondary | ICD-10-CM

## 2017-10-09 DIAGNOSIS — N182 Chronic kidney disease, stage 2 (mild): Secondary | ICD-10-CM | POA: Diagnosis not present

## 2017-10-09 DIAGNOSIS — E782 Mixed hyperlipidemia: Secondary | ICD-10-CM | POA: Diagnosis not present

## 2017-10-09 DIAGNOSIS — Z23 Encounter for immunization: Secondary | ICD-10-CM

## 2017-10-09 DIAGNOSIS — I129 Hypertensive chronic kidney disease with stage 1 through stage 4 chronic kidney disease, or unspecified chronic kidney disease: Secondary | ICD-10-CM

## 2017-10-09 LAB — BAYER DCA HB A1C WAIVED: HB A1C: 5.9 % (ref <7.0)

## 2017-10-09 MED ORDER — FENOFIBRATE 160 MG PO TABS: 160.0000 mg | ORAL_TABLET | Freq: Every day | ORAL | 1 refills | Status: DC

## 2017-10-09 MED ORDER — PREDNISONE 10 MG PO TABS: ORAL_TABLET | ORAL | 0 refills | Status: DC

## 2017-10-09 MED ORDER — DULAGLUTIDE 0.75 MG/0.5ML ~~LOC~~ SOAJ
SUBCUTANEOUS | 3 refills | Status: DC
Start: 2017-10-09 — End: 2018-05-15

## 2017-10-09 MED ORDER — HYDROCHLOROTHIAZIDE 25 MG PO TABS: 25.0000 mg | ORAL_TABLET | Freq: Every day | ORAL | 1 refills | Status: DC

## 2017-10-09 MED ORDER — LOSARTAN POTASSIUM 25 MG PO TABS: 12.5000 mg | ORAL_TABLET | Freq: Every day | ORAL | 1 refills | Status: DC

## 2017-10-09 MED ORDER — ROSUVASTATIN CALCIUM 10 MG PO TABS: 10.0000 mg | ORAL_TABLET | Freq: Every day | ORAL | 1 refills | Status: DC

## 2017-10-09 MED ORDER — AMLODIPINE BESYLATE 10 MG PO TABS: 10.0000 mg | ORAL_TABLET | Freq: Every day | ORAL | 1 refills | Status: DC

## 2017-10-09 MED ORDER — JANUMET XR 50-1000 MG PO TB24: 2.0000 | ORAL_TABLET | Freq: Every day | ORAL | 1 refills | Status: DC

## 2017-10-09 MED ORDER — ALBUTEROL SULFATE HFA 108 (90 BASE) MCG/ACT IN AERS: 2.0000 | INHALATION_SPRAY | RESPIRATORY_TRACT | 1 refills | Status: DC | PRN

## 2017-10-09 NOTE — Assessment & Plan Note (Signed)
Rechecking levels today. Continue current regimen. Continue to monitor. Call with any concerns.  

## 2017-10-09 NOTE — Assessment & Plan Note (Addendum)
Under good control with A1c of 5.9. Continue current regimen. Continue to monitor. Recheck 6 months. If still this good in 6 months, will decrease medicine.

## 2017-10-09 NOTE — Progress Notes (Signed)
BP (!) 158/84 (BP Location: Left Arm, Patient Position: Sitting, Cuff Size: Large)   Pulse (!) 58   Temp 98.6 F (37 C)   Wt 218 lb 9 oz (99.1 kg)   SpO2 95%   BMI 28.84 kg/m    Subjective:    Patient ID: Manuel Burnett, male    DOB: 05-15-1956, 61 y.o.   MRN: [id number]  HPI: Manuel Burnett is a 61 y.o. male  Chief Complaint  Patient presents with  . Hypertension  . Hyperlipidemia  . Diabetes  . Cough   COUGH Duration: 2 months Circumstances of initial development of cough: unknown Cough severity: moderate Cough description: non-productive Aggravating factors:  nothing Alleviating factors: nothing Status:  stable Treatments attempted: prednisone, mucinex, antibiotics and albuterol Wheezing: no Shortness of breath: no Chest pain: no Chest tightness:yes Nasal congestion: no Runny nose: no Postnasal drip: no Frequent throat clearing or swallowing: yes Hemoptysis: no Fevers: no Night sweats: no Weight loss: no Heartburn: no Recent foreign travel: no Tuberculosis contacts: no  HYPERTENSION / HYPERLIPIDEMIA Satisfied with current treatment? yes Duration of hypertension: chronic BP monitoring frequency: not checking BP medication side effects: no Past BP meds: losartan, hctz, amlopdine Duration of hyperlipidemia: chronic Cholesterol medication side effects: no Cholesterol supplements: none Past cholesterol medications: rosuvastatin (crestor) Medication compliance: excellent compliance Aspirin: yes Recent stressors: no Recurrent headaches: no Visual changes: no Palpitations: no Dyspnea: no Chest pain: no Lower extremity edema: no Dizzy/lightheaded: no  DIABETES Hypoglycemic episodes:no Polydipsia/polyuria: no Visual disturbance: no Chest pain: no Paresthesias: no Glucose Monitoring: yes  Accucheck frequency: few times a week Taking Insulin?: no Blood Pressure Monitoring: not checking Retinal Examination: Up to Date Foot Exam: Up to  Date Diabetic Education: Completed Pneumovax: Up to Date Influenza: Up to Date Aspirin: yes  Relevant past medical, surgical, family and social history reviewed and updated as indicated. Interim medical history since our last visit reviewed. Allergies and medications reviewed and updated.  Review of Systems  Constitutional: Negative.   Respiratory: Negative.   Cardiovascular: Negative.   Genitourinary: Negative.   Musculoskeletal: Negative.   Psychiatric/Behavioral: Negative.     Per HPI unless specifically indicated above     Objective:    BP (!) 158/84 (BP Location: Left Arm, Patient Position: Sitting, Cuff Size: Large)   Pulse (!) 58   Temp 98.6 F (37 C)   Wt 218 lb 9 oz (99.1 kg)   SpO2 95%   BMI 28.84 kg/m   Wt Readings from Last 3 Encounters:  10/09/17 218 lb 9 oz (99.1 kg)  08/15/17 215 lb (97.5 kg)  04/03/17 218 lb 1.6 oz (98.9 kg)    Physical Exam  Constitutional: He is oriented to person, place, and time. He appears well-developed and well-nourished. No distress.  HENT:  Head: Normocephalic and atraumatic.  Right Ear: Hearing normal.  Left Ear: Hearing normal.  Nose: Nose normal.  Eyes: Conjunctivae and lids are normal. Right eye exhibits no discharge. Left eye exhibits no discharge. No scleral icterus.  Cardiovascular: Normal rate, regular rhythm, normal heart sounds and intact distal pulses.  Exam reveals no gallop and no friction rub.   No murmur heard. Pulmonary/Chest: Effort normal. No respiratory distress. He has wheezes. He has no rales. He exhibits no tenderness.  Musculoskeletal: Normal range of motion.  Neurological: He is alert and oriented to person, place, and time.  Skin: Skin is warm, dry and intact. No rash noted. He is not diaphoretic. No erythema. No pallor.  Psychiatric:  He has a normal mood and affect. His speech is normal and behavior is normal. Judgment and thought content normal. Cognition and memory are normal.    Results for  orders placed or performed in visit on 04/03/17  Microscopic Examination  Result Value Ref Range   WBC, UA None seen 0 - 5 /hpf   RBC, UA None seen 0 - 2 /hpf   Epithelial Cells (non renal) CANCELED    Bacteria, UA None seen None seen/Few  Bayer DCA Hb A1c Waived  Result Value Ref Range   Bayer DCA Hb A1c Waived 6.2 <7.0 %  CBC with Differential/Platelet  Result Value Ref Range   WBC 6.0 3.4 - 10.8 x10E3/uL   RBC 4.91 4.14 - 5.80 x10E6/uL   Hemoglobin 14.7 13.0 - 17.7 g/dL   Hematocrit 41.6 37.5 - 51.0 %   MCV 85 79 - 97 fL   MCH 29.9 26.6 - 33.0 pg   MCHC 35.3 31.5 - 35.7 g/dL   RDW 13.2 12.3 - 15.4 %   Platelets 169 150 - 379 x10E3/uL   Neutrophils 56 Not Estab. %   Lymphs 29 Not Estab. %   Monocytes 10 Not Estab. %   Eos 4 Not Estab. %   Basos 1 Not Estab. %   Neutrophils Absolute 3.4 1.4 - 7.0 x10E3/uL   Lymphocytes Absolute 1.7 0.7 - 3.1 x10E3/uL   Monocytes Absolute 0.6 0.1 - 0.9 x10E3/uL   EOS (ABSOLUTE) 0.3 0.0 - 0.4 x10E3/uL   Basophils Absolute 0.0 0.0 - 0.2 x10E3/uL   Immature Granulocytes 0 Not Estab. %   Immature Grans (Abs) 0.0 0.0 - 0.1 x10E3/uL  Comprehensive metabolic panel  Result Value Ref Range   Glucose 119 (H) 65 - 99 mg/dL   BUN 18 6 - 24 mg/dL   Creatinine, Ser 1.12 0.76 - 1.27 mg/dL   GFR calc non Af Amer 72 >59 mL/min/1.73   GFR calc Af Amer 83 >59 mL/min/1.73   BUN/Creatinine Ratio 16 9 - 20   Sodium 144 134 - 144 mmol/L   Potassium 4.5 3.5 - 5.2 mmol/L   Chloride 100 96 - 106 mmol/L   CO2 27 18 - 29 mmol/L   Calcium 10.1 8.7 - 10.2 mg/dL   Total Protein 7.1 6.0 - 8.5 g/dL   Albumin 4.7 3.5 - 5.5 g/dL   Globulin, Total 2.4 1.5 - 4.5 g/dL   Albumin/Globulin Ratio 2.0 1.2 - 2.2   Bilirubin Total 0.4 0.0 - 1.2 mg/dL   Alkaline Phosphatase 44 39 - 117 IU/L   AST 18 0 - 40 IU/L   ALT 11 0 - 44 IU/L  Lipid Panel w/o Chol/HDL Ratio  Result Value Ref Range   Cholesterol, Total 140 100 - 199 mg/dL   Triglycerides 173 (H) 0 - 149 mg/dL   HDL  40 >39 mg/dL   VLDL Cholesterol Cal 35 5 - 40 mg/dL   LDL Calculated 65 0 - 99 mg/dL  Microalbumin, Urine Waived  Result Value Ref Range   Microalb, Ur Waived 30 (H) 0 - 19 mg/L   Creatinine, Urine Waived 200 10 - 300 mg/dL   Microalb/Creat Ratio <30 <30 mg/g  PSA  Result Value Ref Range   Prostate Specific Ag, Serum 2.2 0.0 - 4.0 ng/mL  TSH  Result Value Ref Range   TSH 2.260 0.450 - 4.500 uIU/mL  UA/M w/rflx Culture, Routine  Result Value Ref Range   Specific Gravity, UA 1.020 1.005 - 1.030   pH,  UA 7.5 5.0 - 7.5   Color, UA Yellow Yellow   Appearance Ur Clear Clear   Leukocytes, UA Negative Negative   Protein, UA Negative Negative/Trace   Glucose, UA Trace (A) Negative   Ketones, UA Negative Negative   RBC, UA Negative Negative   Bilirubin, UA Negative Negative   Urobilinogen, Ur 0.2 0.2 - 1.0 mg/dL   Nitrite, UA Negative Negative   Microscopic Examination See below:   Testosterone, free, total  Result Value Ref Range   Testosterone 369 264 - 916 ng/dL   Testosterone, Free 5.4 (L) 7.2 - 24.0 pg/mL   Sex Hormone Binding 48.9 19.3 - 76.4 nmol/L      Assessment & Plan:   Problem List Items Addressed This Visit      Endocrine   Diabetes mellitus due to underlying condition with diabetic neuropathy ([place]) - Primary    Under good control with A1c of 5.9. Continue current regimen. Continue to monitor. Recheck 6 months. If still this good in 6 months, will decrease medicine.       Relevant Medications   rosuvastatin (CRESTOR) 10 MG tablet   losartan (COZAAR) 25 MG tablet   JANUMET XR 50-1000 MG TB24   Dulaglutide (TRULICITY) 3.15 QM/0.8QP SOPN   Other Relevant Orders   Bayer DCA Hb A1c Waived   Comprehensive metabolic panel     Genitourinary   Benign hypertensive renal disease    Did not take his medicine today, so BP is high. Take medicine, recheck in 2 weeks at lung recheck.       Relevant Orders   CBC with Differential/Platelet   Comprehensive metabolic panel    Chronic kidney disease, stage II (mild)    Rechecking levels today. Continue current regimen. Continue to monitor. Call with any concerns.       Relevant Orders   CBC with Differential/Platelet   Comprehensive metabolic panel     Other   Hyperlipemia    Rechecking levels today. Continue current regimen. Continue to monitor. Call with any concerns.       Relevant Medications   rosuvastatin (CRESTOR) 10 MG tablet   losartan (COZAAR) 25 MG tablet   hydrochlorothiazide (HYDRODIURIL) 25 MG tablet   fenofibrate 160 MG tablet   amLODipine (NORVASC) 10 MG tablet   Other Relevant Orders   Comprehensive metabolic panel   Lipid Panel w/o Chol/HDL Ratio    Other Visit Diagnoses    Immunization due       Flu shot given today.   Relevant Orders   Flu Vaccine QUAD 6+ mos PF IM (Fluarix Quad PF) (Completed)   Acute bronchitis, unspecified organism       Will treat with albuterol and inhaler. Recheck lungs 2 weeks with spiro if not significantly better.        Follow up plan: Return in about 2 weeks (around 10/23/2017) for Lung recheck.

## 2017-10-09 NOTE — Assessment & Plan Note (Signed)
Did not take his medicine today, so BP is high. Take medicine, recheck in 2 weeks at lung recheck.

## 2017-10-10 LAB — COMPREHENSIVE METABOLIC PANEL
A/G RATIO: 1.8 (ref 1.2–2.2)
ALT: 14 IU/L (ref 0–44)
AST: 21 IU/L (ref 0–40)
Albumin: 4.6 g/dL (ref 3.5–5.5)
Alkaline Phosphatase: 45 IU/L (ref 39–117)
BILIRUBIN TOTAL: 0.6 mg/dL (ref 0.0–1.2)
BUN/Creatinine Ratio: 11 (ref 9–20)
BUN: 15 mg/dL (ref 6–24)
CALCIUM: 9.5 mg/dL (ref 8.7–10.2)
CHLORIDE: 104 mmol/L (ref 96–106)
CO2: 24 mmol/L (ref 20–29)
Creatinine, Ser: 1.42 mg/dL — ABNORMAL HIGH (ref 0.76–1.27)
GFR calc Af Amer: 62 mL/min/{1.73_m2} (ref >59)
GFR calc non Af Amer: 54 mL/min/{1.73_m2} — ABNORMAL LOW (ref >59)
Globulin, Total: 2.5 g/dL (ref 1.5–4.5)
Glucose: 134 mg/dL — ABNORMAL HIGH (ref 65–99)
POTASSIUM: 3.8 mmol/L (ref 3.5–5.2)
Sodium: 143 mmol/L (ref 134–144)
Total Protein: 7.1 g/dL (ref 6.0–8.5)

## 2017-10-10 LAB — CBC WITH DIFFERENTIAL/PLATELET
BASOS ABS: 0.1 10*3/uL (ref 0.0–0.2)
BASOS: 1 %
EOS (ABSOLUTE): 0.4 10*3/uL (ref 0.0–0.4)
Eos: 4 %
Hematocrit: 43.3 % (ref 37.5–51.0)
Hemoglobin: 14.6 g/dL (ref 13.0–17.7)
Immature Grans (Abs): 0 10*3/uL (ref 0.0–0.1)
Immature Granulocytes: 0 %
LYMPHS ABS: 1.9 10*3/uL (ref 0.7–3.1)
Lymphs: 24 %
MCH: 29.3 pg (ref 26.6–33.0)
MCHC: 33.7 g/dL (ref 31.5–35.7)
MCV: 87 fL (ref 79–97)
Monocytes Absolute: 0.7 10*3/uL (ref 0.1–0.9)
Monocytes: 9 %
NEUTROS ABS: 4.9 10*3/uL (ref 1.4–7.0)
Neutrophils: 62 %
PLATELETS: 171 10*3/uL (ref 150–379)
RBC: 4.98 x10E6/uL (ref 4.14–5.80)
RDW: 13.6 % (ref 12.3–15.4)
WBC: 7.9 10*3/uL (ref 3.4–10.8)

## 2017-10-10 LAB — LIPID PANEL W/O CHOL/HDL RATIO
Cholesterol, Total: 149 mg/dL (ref 100–199)
HDL: 45 mg/dL (ref >39)
LDL Calculated: 80 mg/dL (ref 0–99)
TRIGLYCERIDES: 119 mg/dL (ref 0–149)
VLDL Cholesterol Cal: 24 mg/dL (ref 5–40)

## 2017-10-23 ENCOUNTER — Ambulatory Visit: Payer: Managed Care, Other (non HMO) | Admitting: Family Medicine

## 2017-10-23 ENCOUNTER — Encounter: Payer: Self-pay | Admitting: Family Medicine

## 2017-10-23 VITALS — BP 156/80 | HR 67 | Temp 98.6°F | Wt 230.4 lb

## 2017-10-23 DIAGNOSIS — J209 Acute bronchitis, unspecified: Secondary | ICD-10-CM | POA: Diagnosis not present

## 2017-10-23 DIAGNOSIS — I129 Hypertensive chronic kidney disease with stage 1 through stage 4 chronic kidney disease, or unspecified chronic kidney disease: Secondary | ICD-10-CM | POA: Diagnosis not present

## 2017-10-23 MED ORDER — LOSARTAN POTASSIUM 25 MG PO TABS: 25.0000 mg | ORAL_TABLET | Freq: Every day | ORAL | 1 refills | Status: DC

## 2017-10-23 NOTE — Progress Notes (Signed)
BP (!) 156/80 (BP Location: Left Arm, Cuff Size: Large)   Pulse 67   Temp 98.6 F (37 C)   Wt 230 lb 6 oz (104.5 kg)   SpO2 97%   BMI 30.39 kg/m    Subjective:    Patient ID: Manuel Burnett, male    DOB: September 27, 1956, 61 y.o.   MRN: [id number]  HPI: Manuel Burnett is a 61 y.o. male  Chief Complaint  Patient presents with  . Hypertension  . Lung Recheck   Lungs feeling better. No more fevers. No more coughing. Feeling well.   HYPERTENSION Hypertension status: stable  Satisfied with current treatment? yes Duration of hypertension: chronic BP monitoring frequency:  not checking BP medication side effects:  no Medication compliance: excellent compliance Previous BP meds:amlodipine, losartan, hctz Aspirin: yes Recurrent headaches: no Visual changes: no Palpitations: no Dyspnea: no Chest pain: no Lower extremity edema: no Dizzy/lightheaded: no   Relevant past medical, surgical, family and social history reviewed and updated as indicated. Interim medical history since our last visit reviewed. Allergies and medications reviewed and updated.  Review of Systems  Constitutional: Negative.   Respiratory: Negative.   Cardiovascular: Negative.   Psychiatric/Behavioral: Negative.     Per HPI unless specifically indicated above     Objective:    BP (!) 156/80 (BP Location: Left Arm, Cuff Size: Large)   Pulse 67   Temp 98.6 F (37 C)   Wt 230 lb 6 oz (104.5 kg)   SpO2 97%   BMI 30.39 kg/m   Wt Readings from Last 3 Encounters:  10/23/17 230 lb 6 oz (104.5 kg)  10/09/17 218 lb 9 oz (99.1 kg)  08/15/17 215 lb (97.5 kg)    Physical Exam  Constitutional: He is oriented to person, place, and time. He appears well-developed and well-nourished. No distress.  HENT:  Head: Normocephalic and atraumatic.  Right Ear: Hearing normal.  Left Ear: Hearing normal.  Nose: Nose normal.  Eyes: Conjunctivae and lids are normal. Right eye exhibits no discharge. Left eye  exhibits no discharge. No scleral icterus.  Cardiovascular: Normal rate, regular rhythm, normal heart sounds and intact distal pulses. Exam reveals no gallop and no friction rub.  No murmur heard. Pulmonary/Chest: Effort normal and breath sounds normal. No respiratory distress. He has no wheezes. He has no rales. He exhibits no tenderness.  Musculoskeletal: Normal range of motion.  Neurological: He is alert and oriented to person, place, and time.  Skin: Skin is warm, dry and intact. No rash noted. He is not diaphoretic. No erythema. No pallor.  Psychiatric: He has a normal mood and affect. His speech is normal and behavior is normal. Judgment and thought content normal. Cognition and memory are normal.  Nursing note and vitals reviewed.   Results for orders placed or performed in visit on 10/09/17  Bayer DCA Hb A1c Waived  Result Value Ref Range   Bayer DCA Hb A1c Waived 5.9 <7.0 %  CBC with Differential/Platelet  Result Value Ref Range   WBC 7.9 3.4 - 10.8 x10E3/uL   RBC 4.98 4.14 - 5.80 x10E6/uL   Hemoglobin 14.6 13.0 - 17.7 g/dL   Hematocrit 43.3 37.5 - 51.0 %   MCV 87 79 - 97 fL   MCH 29.3 26.6 - 33.0 pg   MCHC 33.7 31.5 - 35.7 g/dL   RDW 13.6 12.3 - 15.4 %   Platelets 171 150 - 379 x10E3/uL   Neutrophils 62 Not Estab. %   Lymphs 24  Not Estab. %   Monocytes 9 Not Estab. %   Eos 4 Not Estab. %   Basos 1 Not Estab. %   Neutrophils Absolute 4.9 1.4 - 7.0 x10E3/uL   Lymphocytes Absolute 1.9 0.7 - 3.1 x10E3/uL   Monocytes Absolute 0.7 0.1 - 0.9 x10E3/uL   EOS (ABSOLUTE) 0.4 0.0 - 0.4 x10E3/uL   Basophils Absolute 0.1 0.0 - 0.2 x10E3/uL   Immature Granulocytes 0 Not Estab. %   Immature Grans (Abs) 0.0 0.0 - 0.1 x10E3/uL  Comprehensive metabolic panel  Result Value Ref Range   Glucose 134 (H) 65 - 99 mg/dL   BUN 15 6 - 24 mg/dL   Creatinine, Ser 1.42 (H) 0.76 - 1.27 mg/dL   GFR calc non Af Amer 54 (L) >59 mL/min/1.73   GFR calc Af Amer 62 >59 mL/min/1.73   BUN/Creatinine  Ratio 11 9 - 20   Sodium 143 134 - 144 mmol/L   Potassium 3.8 3.5 - 5.2 mmol/L   Chloride 104 96 - 106 mmol/L   CO2 24 20 - 29 mmol/L   Calcium 9.5 8.7 - 10.2 mg/dL   Total Protein 7.1 6.0 - 8.5 g/dL   Albumin 4.6 3.5 - 5.5 g/dL   Globulin, Total 2.5 1.5 - 4.5 g/dL   Albumin/Globulin Ratio 1.8 1.2 - 2.2   Bilirubin Total 0.6 0.0 - 1.2 mg/dL   Alkaline Phosphatase 45 39 - 117 IU/L   AST 21 0 - 40 IU/L   ALT 14 0 - 44 IU/L  Lipid Panel w/o Chol/HDL Ratio  Result Value Ref Range   Cholesterol, Total 149 100 - 199 mg/dL   Triglycerides 119 0 - 149 mg/dL   HDL 45 >39 mg/dL   VLDL Cholesterol Cal 24 5 - 40 mg/dL   LDL Calculated 80 0 - 99 mg/dL      Assessment & Plan:   Problem List Items Addressed This Visit      Genitourinary   Benign hypertensive renal disease - Primary    Still high. Will increase to 25mg  daily and recheck in 6 months. Call with any concerns.        Other Visit Diagnoses    Acute bronchitis, unspecified organism       Resolved. Lungs clear. Continue to monitor.        Follow up plan: Return in about 6 months (around 04/22/2018) for physical.

## 2017-10-23 NOTE — Assessment & Plan Note (Signed)
Still high. Will increase to 25mg  daily and recheck in 6 months. Call with any concerns.

## 2017-12-19 HISTORY — PX: EYE SURGERY: SHX253

## 2017-12-25 ENCOUNTER — Encounter: Payer: Self-pay | Admitting: *Deleted

## 2017-12-26 ENCOUNTER — Ambulatory Visit: Payer: BLUE CROSS/BLUE SHIELD | Admitting: Anesthesiology

## 2017-12-26 ENCOUNTER — Ambulatory Visit
Admission: RE | Admit: 2017-12-26 | Discharge: 2017-12-26 | Disposition: A | Discharge status: Home / Self Care | Payer: BLUE CROSS/BLUE SHIELD | Source: Ambulatory Visit | Attending: Ophthalmology | Admitting: Ophthalmology

## 2017-12-26 ENCOUNTER — Encounter
Admission: RE | Disposition: A | Discharge status: Home / Self Care | Payer: Self-pay | Source: Ambulatory Visit | Attending: Ophthalmology

## 2017-12-26 ENCOUNTER — Encounter: Payer: Self-pay | Admitting: Anesthesiology

## 2017-12-26 DIAGNOSIS — Z885 Allergy status to narcotic agent status: Secondary | ICD-10-CM | POA: Diagnosis not present

## 2017-12-26 DIAGNOSIS — G4730 Sleep apnea, unspecified: Secondary | ICD-10-CM | POA: Insufficient documentation

## 2017-12-26 DIAGNOSIS — Z85828 Personal history of other malignant neoplasm of skin: Secondary | ICD-10-CM | POA: Insufficient documentation

## 2017-12-26 DIAGNOSIS — E1136 Type 2 diabetes mellitus with diabetic cataract: Secondary | ICD-10-CM | POA: Diagnosis present

## 2017-12-26 DIAGNOSIS — E1122 Type 2 diabetes mellitus with diabetic chronic kidney disease: Secondary | ICD-10-CM | POA: Diagnosis not present

## 2017-12-26 DIAGNOSIS — H2511 Age-related nuclear cataract, right eye: Secondary | ICD-10-CM | POA: Diagnosis not present

## 2017-12-26 DIAGNOSIS — F17210 Nicotine dependence, cigarettes, uncomplicated: Secondary | ICD-10-CM | POA: Insufficient documentation

## 2017-12-26 DIAGNOSIS — Z79899 Other long term (current) drug therapy: Secondary | ICD-10-CM | POA: Insufficient documentation

## 2017-12-26 DIAGNOSIS — Z79890 Hormone replacement therapy (postmenopausal): Secondary | ICD-10-CM | POA: Diagnosis not present

## 2017-12-26 DIAGNOSIS — E039 Hypothyroidism, unspecified: Secondary | ICD-10-CM | POA: Diagnosis not present

## 2017-12-26 DIAGNOSIS — N529 Male erectile dysfunction, unspecified: Secondary | ICD-10-CM | POA: Diagnosis not present

## 2017-12-26 DIAGNOSIS — K219 Gastro-esophageal reflux disease without esophagitis: Secondary | ICD-10-CM | POA: Diagnosis not present

## 2017-12-26 DIAGNOSIS — Z7984 Long term (current) use of oral hypoglycemic drugs: Secondary | ICD-10-CM | POA: Insufficient documentation

## 2017-12-26 DIAGNOSIS — N189 Chronic kidney disease, unspecified: Secondary | ICD-10-CM | POA: Insufficient documentation

## 2017-12-26 DIAGNOSIS — E785 Hyperlipidemia, unspecified: Secondary | ICD-10-CM | POA: Diagnosis not present

## 2017-12-26 DIAGNOSIS — I129 Hypertensive chronic kidney disease with stage 1 through stage 4 chronic kidney disease, or unspecified chronic kidney disease: Secondary | ICD-10-CM | POA: Insufficient documentation

## 2017-12-26 HISTORY — DX: Gastro-esophageal reflux disease without esophagitis: K21.9

## 2017-12-26 HISTORY — PX: CATARACT EXTRACTION W/PHACO: SHX586

## 2017-12-26 HISTORY — DX: Sleep apnea, unspecified: G47.30

## 2017-12-26 HISTORY — DX: Malignant (primary) neoplasm, unspecified: C80.1

## 2017-12-26 HISTORY — DX: Hypothyroidism, unspecified: E03.9

## 2017-12-26 LAB — GLUCOSE, CAPILLARY: Glucose-Capillary: 173 mg/dL — ABNORMAL HIGH (ref 65–99)

## 2017-12-26 SURGERY — PHACOEMULSIFICATION, CATARACT, WITH IOL INSERTION
Anesthesia: Monitor Anesthesia Care | Site: Eye | Laterality: Right | Wound class: Clean

## 2017-12-26 MED ORDER — MOXIFLOXACIN HCL 0.5 % OP SOLN
OPHTHALMIC | Status: AC
  Filled 2017-12-26: qty 3

## 2017-12-26 MED ORDER — EPINEPHRINE PF 1 MG/ML IJ SOLN
INTRAOCULAR | Status: DC | PRN
  Administered 2017-12-26: 09:00:00 via OPHTHALMIC

## 2017-12-26 MED ORDER — LIDOCAINE HCL (PF) 4 % IJ SOLN
INTRAMUSCULAR | Status: AC
  Filled 2017-12-26: qty 5

## 2017-12-26 MED ORDER — POVIDONE-IODINE 5 % OP SOLN
OPHTHALMIC | Status: AC
  Filled 2017-12-26: qty 30

## 2017-12-26 MED ORDER — MOXIFLOXACIN HCL 0.5 % OP SOLN
OPHTHALMIC | Status: DC | PRN
  Administered 2017-12-26: 0.2 mL via OPHTHALMIC

## 2017-12-26 MED ORDER — CARBACHOL 0.01 % IO SOLN
INTRAOCULAR | Status: DC | PRN
  Administered 2017-12-26: 0.5 mL via INTRAOCULAR

## 2017-12-26 MED ORDER — MIDAZOLAM HCL 2 MG/2ML IJ SOLN
INTRAMUSCULAR | Status: DC | PRN
  Administered 2017-12-26 (×2): 1 mg via INTRAVENOUS

## 2017-12-26 MED ORDER — POVIDONE-IODINE 5 % OP SOLN
OPHTHALMIC | Status: DC | PRN
  Administered 2017-12-26: 1 via OPHTHALMIC

## 2017-12-26 MED ORDER — NA CHONDROIT SULF-NA HYALURON 40-17 MG/ML IO SOLN
INTRAOCULAR | Status: DC | PRN
  Administered 2017-12-26: 1 mL via INTRAOCULAR

## 2017-12-26 MED ORDER — MOXIFLOXACIN HCL 0.5 % OP SOLN: 1.0000 [drp] | OPHTHALMIC | Status: DC | PRN

## 2017-12-26 MED ORDER — SODIUM CHLORIDE 0.9 % IV SOLN
INTRAVENOUS | Status: DC
  Administered 2017-12-26: 08:00:00 via INTRAVENOUS

## 2017-12-26 MED ORDER — EPINEPHRINE PF 1 MG/ML IJ SOLN
INTRAMUSCULAR | Status: AC
  Filled 2017-12-26: qty 2

## 2017-12-26 MED ORDER — NA CHONDROIT SULF-NA HYALURON 40-17 MG/ML IO SOLN
INTRAOCULAR | Status: AC
  Filled 2017-12-26: qty 1

## 2017-12-26 MED ORDER — ARMC OPHTHALMIC DILATING DROPS
OPHTHALMIC | Status: AC
  Administered 2017-12-26: 1 via OPHTHALMIC
  Filled 2017-12-26: qty 0.4

## 2017-12-26 MED ORDER — LIDOCAINE HCL (PF) 4 % IJ SOLN
INTRAOCULAR | Status: DC | PRN
  Administered 2017-12-26: 4 mL via OPHTHALMIC

## 2017-12-26 MED ORDER — ARMC OPHTHALMIC DILATING DROPS
1.0000 | OPHTHALMIC | Status: AC
Start: 2017-12-26 — End: 2017-12-26
  Administered 2017-12-26 (×3): 1 via OPHTHALMIC

## 2017-12-26 MED ORDER — MIDAZOLAM HCL 2 MG/2ML IJ SOLN
INTRAMUSCULAR | Status: AC
  Filled 2017-12-26: qty 2

## 2017-12-26 SURGICAL SUPPLY — 16 items
GLOVE BIO SURGEON STRL SZ8 (GLOVE) ×2 IMPLANT
GLOVE BIOGEL M 6.5 STRL (GLOVE) ×2 IMPLANT
GLOVE SURG LX 8.0 MICRO (GLOVE) ×1
GLOVE SURG LX STRL 8.0 MICRO (GLOVE) ×1 IMPLANT
GOWN STRL REUS W/ TWL LRG LVL3 (GOWN DISPOSABLE) ×2 IMPLANT
GOWN STRL REUS W/TWL LRG LVL3 (GOWN DISPOSABLE) ×2
LABEL CATARACT MEDS ST (LABEL) ×2 IMPLANT
LENS IOL TECNIS ITEC 26.0 (Intraocular Lens) ×2 IMPLANT
PACK CATARACT (MISCELLANEOUS) ×2 IMPLANT
PACK CATARACT BRASINGTON LX (MISCELLANEOUS) ×2 IMPLANT
PACK EYE AFTER SURG (MISCELLANEOUS) ×2 IMPLANT
SOL BSS BAG (MISCELLANEOUS) ×2
SOLUTION BSS BAG (MISCELLANEOUS) ×1 IMPLANT
SYR 5ML LL (SYRINGE) ×2 IMPLANT
WATER STERILE IRR 250ML POUR (IV SOLUTION) ×2 IMPLANT
WIPE NON LINTING 3.25X3.25 (MISCELLANEOUS) ×2 IMPLANT

## 2017-12-26 NOTE — Transfer of Care (Signed)
Immediate Anesthesia Transfer of Care Note  Patient: Manuel Burnett  Procedure(s) Performed: CATARACT EXTRACTION PHACO AND INTRAOCULAR LENS PLACEMENT (IOC) (Right Eye)  Patient Location: PACU and Short Stay  Anesthesia Type:MAC  Level of Consciousness: awake, alert , oriented and patient cooperative  Airway & Oxygen Therapy: Patient Spontanous Breathing  Post-op Assessment: Report given to RN and Post -op Vital signs reviewed and stable  Post vital signs: Reviewed and stable  Last Vitals:  Vitals:   12/26/17 0744  BP: (!) 153/86  Pulse: (!) 51  Resp: 17  Temp: (!) 36.3 C  SpO2: 96%    Last Pain:  Vitals:   12/26/17 0744  TempSrc: Temporal         Complications: No apparent anesthesia complications

## 2017-12-26 NOTE — Addendum Note (Signed)
Addendum  created 12/26/17 0935 by Eben Burow, CRNA   Charge Capture section accepted

## 2017-12-26 NOTE — Anesthesia Preprocedure Evaluation (Signed)
Anesthesia Evaluation  Patient identified by MRN, date of birth, ID band Patient awake    Reviewed: Allergy & Precautions, H&P , NPO status , Patient's Chart, lab work & pertinent test results, reviewed documented beta blocker date and time   History of Anesthesia Complications Negative for: history of anesthetic complications  Airway Mallampati: II  TM Distance: >3 FB Neck ROM: full    Dental  (+) Missing, Caps, Dental Advidsory Given   Pulmonary neg shortness of breath, sleep apnea and Continuous Positive Airway Pressure Ventilation , neg COPD, Recent URI , Current Smoker,           Cardiovascular Exercise Tolerance: Good hypertension, (-) angina(-) CAD, (-) Past MI, (-) Cardiac Stents and (-) CABG (-) dysrhythmias (-) Valvular Problems/Murmurs     Neuro/Psych negative neurological ROS  negative psych ROS   GI/Hepatic Neg liver ROS, GERD  ,  Endo/Other  diabetesHypothyroidism   Renal/GU CRFRenal disease  negative genitourinary   Musculoskeletal   Abdominal   Peds  Hematology negative hematology ROS (+)   Anesthesia Other Findings Past Medical History: No date: Cancer ([place])     Comment:  SKIN No date: Chronic kidney disease No date: Diabetes mellitus without complication (HCC) No date: Erectile dysfunction No date: GERD (gastroesophageal reflux disease) No date: Hyperlipidemia No date: Hypertension No date: Hypogonadism male No date: Hypothyroidism No date: Proteinuria No date: Sleep apnea No date: Thyroid disease   Reproductive/Obstetrics negative OB ROS                             Anesthesia Physical Anesthesia Plan  ASA: III  Anesthesia Plan: MAC   Post-op Pain Management:    Induction:   PONV Risk Score and Plan: 0  Airway Management Planned:   Additional Equipment:   Intra-op Plan:   Post-operative Plan:   Informed Consent: I have reviewed the patients  History and Physical, chart, labs and discussed the procedure including the risks, benefits and alternatives for the proposed anesthesia with the patient or authorized representative who has indicated his/her understanding and acceptance.   Dental Advisory Given  Plan Discussed with: Anesthesiologist, CRNA and Surgeon  Anesthesia Plan Comments:         Anesthesia Quick Evaluation

## 2017-12-26 NOTE — Anesthesia Postprocedure Evaluation (Signed)
Anesthesia Post Note  Patient: Manuel Burnett  Procedure(s) Performed: CATARACT EXTRACTION PHACO AND INTRAOCULAR LENS PLACEMENT ([place]) (Right Eye)  Patient location during evaluation: Short Stay Anesthesia Type: MAC Level of consciousness: awake and alert and patient cooperative Pain management: pain level controlled Vital Signs Assessment: post-procedure vital signs reviewed and stable Respiratory status: spontaneous breathing and respiratory function stable Cardiovascular status: blood pressure returned to baseline Postop Assessment: no headache, no backache, no apparent nausea or vomiting and adequate PO intake Anesthetic complications: no     Last Vitals:  Vitals:   12/26/17 0744  BP: (!) 153/86  Pulse: (!) 51  Resp: 17  Temp: (!) 36.3 C  SpO2: 96%    Last Pain:  Vitals:   12/26/17 0744  TempSrc: Temporal                 Eben Burow

## 2017-12-26 NOTE — Anesthesia Post-op Follow-up Note (Signed)
Anesthesia QCDR form completed.        

## 2017-12-26 NOTE — Op Note (Signed)
PREOPERATIVE DIAGNOSIS:  Nuclear sclerotic cataract of the right eye.   POSTOPERATIVE DIAGNOSIS:  NUCLEAR SCLEROTIC CATARACT RIGHT EYE   OPERATIVE PROCEDURE: Procedure(s): CATARACT EXTRACTION PHACO AND INTRAOCULAR LENS PLACEMENT ([facility])   SURGEON:  Birder Robson, MD.   ANESTHESIA:  Anesthesiologist: Martha Clan, MD CRNA: Eben Burow, CRNA  1.      Managed anesthesia care. 2.      0.46ml of Shugarcaine was instilled in the eye following the paracentesis.   COMPLICATIONS:  None.   TECHNIQUE:   Stop and chop   DESCRIPTION OF PROCEDURE:  The patient was examined and consented in the preoperative holding area where the aforementioned topical anesthesia was applied to the right eye and then brought back to the Operating Room where the right eye was prepped and draped in the usual sterile ophthalmic fashion and a lid speculum was placed. A paracentesis was created with the side port blade and the anterior chamber was filled with viscoelastic. A near clear corneal incision was performed with the steel keratome. A continuous curvilinear capsulorrhexis was performed with a cystotome followed by the capsulorrhexis forceps. Hydrodissection and hydrodelineation were carried out with BSS on a blunt cannula. The lens was removed in a stop and chop  technique and the remaining cortical material was removed with the irrigation-aspiration handpiece. The capsular bag was inflated with viscoelastic and the Technis ZCB00  lens was placed in the capsular bag without complication. The remaining viscoelastic was removed from the eye with the irrigation-aspiration handpiece. The wounds were hydrated. The anterior chamber was flushed with Miostat and the eye was inflated to physiologic pressure. 0.36ml of Vigamox was placed in the anterior chamber. The wounds were found to be water tight. The eye was dressed with Vigamox. The patient was given protective glasses to wear throughout the day and a shield with which to  sleep tonight. The patient was also given drops with which to begin a drop regimen today and will follow-up with me in one day. Implant Name Type Inv. Item Serial No. Manufacturer Lot No. LRB No. Used  LENS IOL DIOP 26.0 - [id number] Intraocular Lens LENS IOL DIOP 26.0 [id number]  Right 1   Procedure(s) with comments: CATARACT EXTRACTION PHACO AND INTRAOCULAR LENS PLACEMENT ([facility]) (Right) - [place] 00:21.8 AP% 21.1 CDE 2.64 FLUID PACK LOT # [id number]  Electronically signed: Tim Lair 12/26/2017 9:31 AM

## 2017-12-26 NOTE — H&P (Signed)
All labs reviewed. Abnormal studies sent to patients PCP when indicated.  Previous H&P reviewed, patient examined, there are NO CHANGES.  Espyn Radwan LOUIS1/8/20199:07 AM

## 2017-12-26 NOTE — Discharge Instructions (Signed)
Eye Surgery Discharge Instructions  Expect mild scratchy sensation or mild soreness. DO NOT RUB YOUR EYE!  The day of surgery:  Minimal physical activity, but bed rest is not required  No reading, computer work, or close hand work  No bending, lifting, or straining.  May watch TV  For 24 hours:  No driving, legal decisions, or alcoholic beverages  Safety precautions  Eat anything you prefer: It is better to start with liquids, then soup then solid foods.  _____ Eye patch should be worn until postoperative exam tomorrow.  ____ Solar shield eyeglasses should be worn for comfort in the sunlight/patch while sleeping  Resume all regular medications including aspirin or Coumadin if these were discontinued prior to surgery. You may shower, bathe, shave, or wash your hair. Tylenol may be taken for mild discomfort.  Call your doctor if you experience significant pain, nausea, or vomiting, fever > 101 or other signs of infection. [phone] or [phone] Specific instructions:  Follow-up Information    Birder Robson, MD Follow up on 12/27/2017.   Specialty:  Ophthalmology Why:  tomorrow at 9:45 am. Contact information: [place] [address] [phone]

## 2018-01-04 ENCOUNTER — Encounter: Payer: Self-pay | Admitting: *Deleted

## 2018-01-09 ENCOUNTER — Ambulatory Visit: Payer: BLUE CROSS/BLUE SHIELD | Admitting: Registered Nurse

## 2018-01-09 ENCOUNTER — Other Ambulatory Visit: Payer: Self-pay

## 2018-01-09 ENCOUNTER — Ambulatory Visit
Admission: RE | Admit: 2018-01-09 | Discharge: 2018-01-09 | Disposition: A | Discharge status: Home / Self Care | Payer: BLUE CROSS/BLUE SHIELD | Source: Ambulatory Visit | Attending: Ophthalmology | Admitting: Ophthalmology

## 2018-01-09 ENCOUNTER — Encounter: Payer: Self-pay | Admitting: *Deleted

## 2018-01-09 ENCOUNTER — Encounter
Admission: RE | Disposition: A | Discharge status: Home / Self Care | Payer: Self-pay | Source: Ambulatory Visit | Attending: Ophthalmology

## 2018-01-09 DIAGNOSIS — E079 Disorder of thyroid, unspecified: Secondary | ICD-10-CM | POA: Insufficient documentation

## 2018-01-09 DIAGNOSIS — E119 Type 2 diabetes mellitus without complications: Secondary | ICD-10-CM | POA: Diagnosis not present

## 2018-01-09 DIAGNOSIS — Z7984 Long term (current) use of oral hypoglycemic drugs: Secondary | ICD-10-CM | POA: Diagnosis not present

## 2018-01-09 DIAGNOSIS — F17200 Nicotine dependence, unspecified, uncomplicated: Secondary | ICD-10-CM | POA: Diagnosis not present

## 2018-01-09 DIAGNOSIS — Z9989 Dependence on other enabling machines and devices: Secondary | ICD-10-CM | POA: Insufficient documentation

## 2018-01-09 DIAGNOSIS — J449 Chronic obstructive pulmonary disease, unspecified: Secondary | ICD-10-CM | POA: Insufficient documentation

## 2018-01-09 DIAGNOSIS — I10 Essential (primary) hypertension: Secondary | ICD-10-CM | POA: Diagnosis not present

## 2018-01-09 DIAGNOSIS — G4730 Sleep apnea, unspecified: Secondary | ICD-10-CM | POA: Diagnosis not present

## 2018-01-09 DIAGNOSIS — K219 Gastro-esophageal reflux disease without esophagitis: Secondary | ICD-10-CM | POA: Diagnosis not present

## 2018-01-09 DIAGNOSIS — E7800 Pure hypercholesterolemia, unspecified: Secondary | ICD-10-CM | POA: Insufficient documentation

## 2018-01-09 DIAGNOSIS — Z85828 Personal history of other malignant neoplasm of skin: Secondary | ICD-10-CM | POA: Insufficient documentation

## 2018-01-09 DIAGNOSIS — H2512 Age-related nuclear cataract, left eye: Secondary | ICD-10-CM | POA: Insufficient documentation

## 2018-01-09 HISTORY — PX: CATARACT EXTRACTION W/PHACO: SHX586

## 2018-01-09 LAB — GLUCOSE, CAPILLARY: GLUCOSE-CAPILLARY: 186 mg/dL — AB (ref 65–99)

## 2018-01-09 SURGERY — PHACOEMULSIFICATION, CATARACT, WITH IOL INSERTION
Anesthesia: Monitor Anesthesia Care | Site: Eye | Laterality: Left | Wound class: Clean

## 2018-01-09 MED ORDER — ARMC OPHTHALMIC DILATING DROPS
OPHTHALMIC | Status: AC
  Administered 2018-01-09: 1 via OPHTHALMIC
  Filled 2018-01-09: qty 0.4

## 2018-01-09 MED ORDER — SODIUM CHLORIDE 0.9 % IV SOLN
INTRAVENOUS | Status: DC
  Administered 2018-01-09: 08:00:00 via INTRAVENOUS

## 2018-01-09 MED ORDER — BSS IO SOLN
INTRAOCULAR | Status: DC | PRN
  Administered 2018-01-09: 09:00:00 via OPHTHALMIC

## 2018-01-09 MED ORDER — ONDANSETRON HCL 4 MG/2ML IJ SOLN: 4.0000 mg | Freq: Once | INTRAMUSCULAR | Status: DC | PRN

## 2018-01-09 MED ORDER — LIDOCAINE HCL (PF) 4 % IJ SOLN
INTRAMUSCULAR | Status: AC
  Filled 2018-01-09: qty 5

## 2018-01-09 MED ORDER — POVIDONE-IODINE 5 % OP SOLN
OPHTHALMIC | Status: AC
  Filled 2018-01-09: qty 30

## 2018-01-09 MED ORDER — ARMC OPHTHALMIC DILATING DROPS
1.0000 "application " | OPHTHALMIC | Status: AC
  Administered 2018-01-09 (×2): 1 via OPHTHALMIC

## 2018-01-09 MED ORDER — FENTANYL CITRATE (PF) 100 MCG/2ML IJ SOLN
INTRAMUSCULAR | Status: DC | PRN
  Administered 2018-01-09: 50 ug via INTRAVENOUS

## 2018-01-09 MED ORDER — MOXIFLOXACIN HCL 0.5 % OP SOLN
OPHTHALMIC | Status: DC | PRN
  Administered 2018-01-09: 0.2 mL via OPHTHALMIC

## 2018-01-09 MED ORDER — FENTANYL CITRATE (PF) 100 MCG/2ML IJ SOLN
INTRAMUSCULAR | Status: AC
  Filled 2018-01-09: qty 2

## 2018-01-09 MED ORDER — MIDAZOLAM HCL 2 MG/2ML IJ SOLN
INTRAMUSCULAR | Status: DC | PRN
  Administered 2018-01-09 (×2): 1 mg via INTRAVENOUS

## 2018-01-09 MED ORDER — MIDAZOLAM HCL 2 MG/2ML IJ SOLN
INTRAMUSCULAR | Status: AC
  Filled 2018-01-09: qty 2

## 2018-01-09 MED ORDER — CARBACHOL 0.01 % IO SOLN
INTRAOCULAR | Status: DC | PRN
  Administered 2018-01-09: 0.5 mL via INTRAOCULAR

## 2018-01-09 MED ORDER — NA CHONDROIT SULF-NA HYALURON 40-17 MG/ML IO SOLN
INTRAOCULAR | Status: AC
  Filled 2018-01-09: qty 1

## 2018-01-09 MED ORDER — LIDOCAINE HCL (PF) 4 % IJ SOLN
INTRAMUSCULAR | Status: DC | PRN
  Administered 2018-01-09: 4 mL via OPHTHALMIC

## 2018-01-09 MED ORDER — POVIDONE-IODINE 5 % OP SOLN
OPHTHALMIC | Status: DC | PRN
  Administered 2018-01-09: 1 via OPHTHALMIC

## 2018-01-09 MED ORDER — EPINEPHRINE PF 1 MG/ML IJ SOLN
INTRAMUSCULAR | Status: AC
  Filled 2018-01-09: qty 2

## 2018-01-09 MED ORDER — MOXIFLOXACIN HCL 0.5 % OP SOLN
OPHTHALMIC | Status: AC
  Filled 2018-01-09: qty 3

## 2018-01-09 MED ORDER — NA CHONDROIT SULF-NA HYALURON 40-17 MG/ML IO SOLN
INTRAOCULAR | Status: DC | PRN
  Administered 2018-01-09: 1 mL via INTRAOCULAR

## 2018-01-09 MED ORDER — MOXIFLOXACIN HCL 0.5 % OP SOLN: 1.0000 [drp] | OPHTHALMIC | Status: DC | PRN

## 2018-01-09 SURGICAL SUPPLY — 16 items
GLOVE BIO SURGEON STRL SZ8 (GLOVE) ×2 IMPLANT
GLOVE BIOGEL M 6.5 STRL (GLOVE) ×2 IMPLANT
GLOVE SURG LX 8.0 MICRO (GLOVE) ×1
GLOVE SURG LX STRL 8.0 MICRO (GLOVE) ×1 IMPLANT
GOWN STRL REUS W/ TWL LRG LVL3 (GOWN DISPOSABLE) ×2 IMPLANT
GOWN STRL REUS W/TWL LRG LVL3 (GOWN DISPOSABLE) ×2
LABEL CATARACT MEDS ST (LABEL) ×2 IMPLANT
LENS IOL TECNIS ITEC 27.0 (Intraocular Lens) ×2 IMPLANT
PACK CATARACT (MISCELLANEOUS) ×2 IMPLANT
PACK CATARACT BRASINGTON LX (MISCELLANEOUS) ×2 IMPLANT
PACK EYE AFTER SURG (MISCELLANEOUS) ×2 IMPLANT
SOL BSS BAG (MISCELLANEOUS) ×2
SOLUTION BSS BAG (MISCELLANEOUS) ×1 IMPLANT
SYR 5ML LL (SYRINGE) ×2 IMPLANT
WATER STERILE IRR 250ML POUR (IV SOLUTION) ×2 IMPLANT
WIPE NON LINTING 3.25X3.25 (MISCELLANEOUS) ×2 IMPLANT

## 2018-01-09 NOTE — H&P (Signed)
All labs reviewed. Abnormal studies sent to patients PCP when indicated.  Previous H&P reviewed, patient examined, there are NO CHANGES.  Manuel Steely Porfilio1/22/20198:35 AM

## 2018-01-09 NOTE — Anesthesia Postprocedure Evaluation (Signed)
Anesthesia Post Note  Patient: Manuel Burnett  Procedure(s) Performed: CATARACT EXTRACTION PHACO AND INTRAOCULAR LENS PLACEMENT ([place]) (Left Eye)  Patient location during evaluation: PACU Anesthesia Type: MAC Level of consciousness: awake Pain management: pain level controlled Vital Signs Assessment: post-procedure vital signs reviewed and stable Respiratory status: spontaneous breathing Cardiovascular status: blood pressure returned to baseline Postop Assessment: no apparent nausea or vomiting Anesthetic complications: no     Last Vitals:  Vitals:   01/09/18 0744 01/09/18 0858  BP: (!) 153/81 130/80  Pulse: 61 (!) 55  Resp: 16 16  Temp: (!) 36.3 C 36.7 C  SpO2: 95% 95%    Last Pain:  Vitals:   01/09/18 0858  TempSrc: Oral                 Hedda Slade

## 2018-01-09 NOTE — Op Note (Signed)
PREOPERATIVE DIAGNOSIS:  Nuclear sclerotic cataract of the left eye.   POSTOPERATIVE DIAGNOSIS:  Nuclear sclerotic cataract of the left eye.   OPERATIVE PROCEDURE: Procedure(s): CATARACT EXTRACTION PHACO AND INTRAOCULAR LENS PLACEMENT ([facility])   SURGEON:  Birder Robson, MD.   ANESTHESIA:  Anesthesiologist: Gunnar Fusi, MD CRNA: Hedda Slade, CRNA  1.      Managed anesthesia care. 2.     0.55ml of Shugarcaine was instilled following the paracentesis   COMPLICATIONS:  None.   TECHNIQUE:   Stop and chop   DESCRIPTION OF PROCEDURE:  The patient was examined and consented in the preoperative holding area where the aforementioned topical anesthesia was applied to the left eye and then brought back to the Operating Room where the left eye was prepped and draped in the usual sterile ophthalmic fashion and a lid speculum was placed. A paracentesis was created with the side port blade and the anterior chamber was filled with viscoelastic. A near clear corneal incision was performed with the steel keratome. A continuous curvilinear capsulorrhexis was performed with a cystotome followed by the capsulorrhexis forceps. Hydrodissection and hydrodelineation were carried out with BSS on a blunt cannula. The lens was removed in a stop and chop  technique and the remaining cortical material was removed with the irrigation-aspiration handpiece. The capsular bag was inflated with viscoelastic and the Technis ZCB00 lens was placed in the capsular bag without complication. The remaining viscoelastic was removed from the eye with the irrigation-aspiration handpiece. The wounds were hydrated. The anterior chamber was flushed with Miostat and the eye was inflated to physiologic pressure. 0.65ml Vigamox was placed in the anterior chamber. The wounds were found to be water tight. The eye was dressed with Vigamox. The patient was given protective glasses to wear throughout the day and a shield with which to sleep  tonight. The patient was also given drops with which to begin a drop regimen today and will follow-up with me in one day. Implant Name Type Inv. Item Serial No. Manufacturer Lot No. LRB No. Used  LENS IOL DIOP 27.0 - [id number] Intraocular Lens LENS IOL DIOP 27.0 [id number] AMO  Left 1    Procedure(s) with comments: CATARACT EXTRACTION PHACO AND INTRAOCULAR LENS PLACEMENT ([facility]) (Left) - [place] 00:28.9 AP% 10.0 CDE 2.90 Fluid pcak lot # [id number]  Electronically signed: Birder Robson 01/09/2018 8:56 AM

## 2018-01-09 NOTE — Anesthesia Post-op Follow-up Note (Signed)
Anesthesia QCDR form completed.        

## 2018-01-09 NOTE — Anesthesia Preprocedure Evaluation (Signed)
Anesthesia Evaluation  Patient identified by MRN, date of birth, ID band Patient awake    Reviewed: Allergy & Precautions, NPO status , Patient's Chart, lab work & pertinent test results  History of Anesthesia Complications Negative for: history of anesthetic complications  Airway Mallampati: II       Dental   Pulmonary sleep apnea and Continuous Positive Airway Pressure Ventilation , neg COPD, Current Smoker,           Cardiovascular hypertension, Pt. on medications +CHF  (-) Past MI (-) dysrhythmias (-) Valvular Problems/Murmurs     Neuro/Psych neg Seizures    GI/Hepatic Neg liver ROS, GERD  Medicated and Controlled,  Endo/Other  diabetes, Type 2, Oral Hypoglycemic AgentsHyperthyroidism   Renal/GU Renal InsufficiencyRenal disease     Musculoskeletal   Abdominal   Peds  Hematology   Anesthesia Other Findings   Reproductive/Obstetrics                             Anesthesia Physical Anesthesia Plan  ASA: III  Anesthesia Plan: MAC   Post-op Pain Management:    Induction: Intravenous  PONV Risk Score and Plan:   Airway Management Planned: Nasal Cannula  Additional Equipment:   Intra-op Plan:   Post-operative Plan:   Informed Consent: I have reviewed the patients History and Physical, chart, labs and discussed the procedure including the risks, benefits and alternatives for the proposed anesthesia with the patient or authorized representative who has indicated his/her understanding and acceptance.     Plan Discussed with:   Anesthesia Plan Comments:         Anesthesia Quick Evaluation

## 2018-01-09 NOTE — Transfer of Care (Signed)
Immediate Anesthesia Transfer of Care Note  Patient: Manuel Burnett  Procedure(s) Performed: CATARACT EXTRACTION PHACO AND INTRAOCULAR LENS PLACEMENT ([facility]) (Left Eye)  Patient Location: PACU  Anesthesia Type:MAC  Level of Consciousness: awake, alert  and oriented  Airway & Oxygen Therapy: Patient Spontanous Breathing  Post-op Assessment: Report given to RN and Post -op Vital signs reviewed and stable  Post vital signs: Reviewed and stable  Last Vitals:  Vitals:   01/09/18 0744 01/09/18 0858  BP: (!) 153/81 130/80  Pulse: 61 (!) 55  Resp: 16 16  Temp: (!) 36.3 C 36.7 C  SpO2: 95% 95%    Last Pain:  Vitals:   01/09/18 0858  TempSrc: Oral         Complications: No apparent anesthesia complications

## 2018-01-09 NOTE — Discharge Instructions (Signed)
Eye Surgery Discharge Instructions  Expect mild scratchy sensation or mild soreness. DO NOT RUB YOUR EYE!  The day of surgery:  Minimal physical activity, but bed rest is not required  No reading, computer work, or close hand work  No bending, lifting, or straining.  May watch TV  For 24 hours:  No driving, legal decisions, or alcoholic beverages  Safety precautions  Eat anything you prefer: It is better to start with liquids, then soup then solid foods.  _____ Eye patch should be worn until postoperative exam tomorrow.  ____ Solar shield eyeglasses should be worn for comfort in the sunlight/patch while sleeping  Resume all regular medications including aspirin or Coumadin if these were discontinued prior to surgery. You may shower, bathe, shave, or wash your hair. Tylenol may be taken for mild discomfort.  Call your doctor if you experience significant pain, nausea, or vomiting, fever > 101 or other signs of infection. [phone] or [phone] Specific instructions:  Follow-up Information    Birder Robson, MD Follow up.   Specialty:  Ophthalmology Why:  January 23 at 10:35am Contact information: [address] [place] [address] [phone]

## 2018-01-29 ENCOUNTER — Telehealth: Payer: Self-pay | Admitting: Family Medicine

## 2018-01-29 NOTE — Telephone Encounter (Signed)
Not sure I'm going to hear back from the [facility] about this. There is no information contained below- can we see what's going on?

## 2018-01-29 NOTE — Telephone Encounter (Signed)
There is no information in this. There is not another telephone encounter. What does patient need?

## 2018-01-29 NOTE — Telephone Encounter (Signed)
I apologize I thought the medication went in the note too. It was regarding the levothyroxine I called the pt to verify and they actually said we could disregard the message.

## 2018-01-29 NOTE — Telephone Encounter (Signed)
Noted. Thanks.

## 2018-01-29 NOTE — Telephone Encounter (Signed)
Copied from [place]. Topic: Quick Communication - See Telephone Encounter >> Jan 29, 2018 12:12 PM Ether Griffins B wrote: CRM for notification. See Telephone encounter for:  [facility] [place], [place] - [address] calling regarding pts  01/29/18.

## 2018-01-29 NOTE — Telephone Encounter (Signed)
Maryann, do you remember what this patient needed? There is no information in CRM

## 2018-01-29 NOTE — Telephone Encounter (Signed)
Left message on machine for pt to return call to the office.  

## 2018-02-19 ENCOUNTER — Other Ambulatory Visit: Payer: Self-pay | Admitting: Family Medicine

## 2018-02-19 DIAGNOSIS — E291 Testicular hypofunction: Secondary | ICD-10-CM

## 2018-02-19 NOTE — Telephone Encounter (Signed)
Copied from [place] [id number]. Topic: Quick Communication - Rx Refill/Question >> Feb 19, 2018  1:17 PM Yvette Rack wrote: Medication: testosterone cypionate (DEPOTESTOSTERONE CYPIONATE) 200 MG/ML injection   Has the patient contacted their pharmacy? Yes.     (Agent: If no, request that the patient contact the pharmacy for the refill.)   Preferred Pharmacy (with phone number or street name):   [facility], [place] [facility] [phone] (Phone) [phone] (Fax)      Agent: Please be advised that RX refills may take up to 3 business days. We ask that you follow-up with your pharmacy.

## 2018-02-19 NOTE — Telephone Encounter (Signed)
Called and left a message letting patient know that he needs to come in to have his blood drawn.

## 2018-02-19 NOTE — Telephone Encounter (Signed)
They are going to want him to have a testosterone level drawn before they refill this.

## 2018-02-20 NOTE — Telephone Encounter (Signed)
LOV 10/23/17 Dr. Park Liter [facility] [address]

## 2018-02-20 NOTE — Telephone Encounter (Signed)
OK to call in

## 2018-02-21 ENCOUNTER — Other Ambulatory Visit: Payer: BLUE CROSS/BLUE SHIELD

## 2018-02-21 DIAGNOSIS — E291 Testicular hypofunction: Secondary | ICD-10-CM | POA: Diagnosis not present

## 2018-02-22 ENCOUNTER — Encounter: Payer: Self-pay | Admitting: Family Medicine

## 2018-02-23 LAB — TESTOSTERONE, FREE, TOTAL, SHBG
SEX HORMONE BINDING: 42.5 nmol/L (ref 19.3–76.4)
TESTOSTERONE: 323 ng/dL (ref 264–916)
Testosterone, Free: 4.4 pg/mL — ABNORMAL LOW (ref 6.6–18.1)

## 2018-04-30 ENCOUNTER — Encounter: Payer: Managed Care, Other (non HMO) | Admitting: Family Medicine

## 2018-05-15 ENCOUNTER — Other Ambulatory Visit: Payer: Self-pay | Admitting: Family Medicine

## 2018-05-23 ENCOUNTER — Other Ambulatory Visit: Payer: Self-pay | Admitting: Family Medicine

## 2018-05-24 NOTE — Telephone Encounter (Signed)
fenofibrate refill Last Refill:10/09/17  # 90 1 RF Last OV: 10/09/17 (has upcoming appt 07/09/18) PCP: Park Liter DO Pharmacy:[facility] [address]

## 2018-07-09 ENCOUNTER — Encounter: Payer: Self-pay | Admitting: Family Medicine

## 2018-07-09 ENCOUNTER — Ambulatory Visit (INDEPENDENT_AMBULATORY_CARE_PROVIDER_SITE_OTHER): Payer: BLUE CROSS/BLUE SHIELD | Admitting: Family Medicine

## 2018-07-09 VITALS — BP 134/75 | HR 64 | Temp 98.4°F | Ht 72.1 in | Wt 224.0 lb

## 2018-07-09 DIAGNOSIS — E782 Mixed hyperlipidemia: Secondary | ICD-10-CM

## 2018-07-09 DIAGNOSIS — Z0001 Encounter for general adult medical examination with abnormal findings: Secondary | ICD-10-CM | POA: Diagnosis not present

## 2018-07-09 DIAGNOSIS — N182 Chronic kidney disease, stage 2 (mild): Secondary | ICD-10-CM

## 2018-07-09 DIAGNOSIS — Z0000 Encounter for general adult medical examination without abnormal findings: Secondary | ICD-10-CM

## 2018-07-09 DIAGNOSIS — E039 Hypothyroidism, unspecified: Secondary | ICD-10-CM

## 2018-07-09 DIAGNOSIS — E291 Testicular hypofunction: Secondary | ICD-10-CM | POA: Diagnosis not present

## 2018-07-09 DIAGNOSIS — E0840 Diabetes mellitus due to underlying condition with diabetic neuropathy, unspecified: Secondary | ICD-10-CM

## 2018-07-09 DIAGNOSIS — B354 Tinea corporis: Secondary | ICD-10-CM

## 2018-07-09 DIAGNOSIS — L259 Unspecified contact dermatitis, unspecified cause: Secondary | ICD-10-CM

## 2018-07-09 DIAGNOSIS — Z125 Encounter for screening for malignant neoplasm of prostate: Secondary | ICD-10-CM

## 2018-07-09 DIAGNOSIS — I129 Hypertensive chronic kidney disease with stage 1 through stage 4 chronic kidney disease, or unspecified chronic kidney disease: Secondary | ICD-10-CM

## 2018-07-09 DIAGNOSIS — N2581 Secondary hyperparathyroidism of renal origin: Secondary | ICD-10-CM

## 2018-07-09 LAB — MICROSCOPIC EXAMINATION: RBC, UA: NONE SEEN /hpf (ref 0–2)

## 2018-07-09 LAB — MICROALBUMIN, URINE WAIVED
Creatinine, Urine Waived: 300 mg/dL (ref 10–300)
MICROALB, UR WAIVED: 150 mg/L — AB (ref 0–19)

## 2018-07-09 LAB — UA/M W/RFLX CULTURE, ROUTINE
Bilirubin, UA: NEGATIVE
LEUKOCYTES UA: NEGATIVE
Nitrite, UA: NEGATIVE
RBC, UA: NEGATIVE
SPEC GRAV UA: 1.025 (ref 1.005–1.030)
Urobilinogen, Ur: 0.2 mg/dL (ref 0.2–1.0)
pH, UA: 5 (ref 5.0–7.5)

## 2018-07-09 LAB — HEMOGLOBIN A1C: HEMOGLOBIN A1C: 6.9

## 2018-07-09 LAB — BAYER DCA HB A1C WAIVED: HB A1C (BAYER DCA - WAIVED): 6.9 % (ref <7.0)

## 2018-07-09 MED ORDER — "NEEDLE (DISP) 18G X 1"" MISC": 1 refills | Status: DC

## 2018-07-09 MED ORDER — CLOTRIMAZOLE-BETAMETHASONE 1-0.05 % EX CREA: 1.0000 "application " | TOPICAL_CREAM | Freq: Two times a day (BID) | CUTANEOUS | 1 refills | Status: DC

## 2018-07-09 MED ORDER — ROSUVASTATIN CALCIUM 10 MG PO TABS: 10.0000 mg | ORAL_TABLET | Freq: Every day | ORAL | 1 refills | Status: DC

## 2018-07-09 MED ORDER — TRIAMCINOLONE ACETONIDE 0.5 % EX OINT: 1.0000 "application " | TOPICAL_OINTMENT | Freq: Two times a day (BID) | CUTANEOUS | 0 refills | Status: DC

## 2018-07-09 MED ORDER — GLUCOSE BLOOD VI STRP: ORAL_STRIP | 12 refills | Status: DC

## 2018-07-09 MED ORDER — ALBUTEROL SULFATE HFA 108 (90 BASE) MCG/ACT IN AERS: 2.0000 | INHALATION_SPRAY | RESPIRATORY_TRACT | 1 refills | Status: DC | PRN

## 2018-07-09 MED ORDER — SITAGLIP PHOS-METFORMIN HCL ER 50-1000 MG PO TB24: 2.0000 | ORAL_TABLET | Freq: Every day | ORAL | 1 refills | Status: DC

## 2018-07-09 MED ORDER — DULAGLUTIDE 0.75 MG/0.5ML ~~LOC~~ SOAJ: SUBCUTANEOUS | 6 refills | Status: DC

## 2018-07-09 MED ORDER — AMLODIPINE BESYLATE 10 MG PO TABS: 10.0000 mg | ORAL_TABLET | Freq: Every day | ORAL | 1 refills | Status: DC

## 2018-07-09 MED ORDER — "BD INTEGRA SYRINGE 22G X 1-1/2"" 3 ML MISC": 1 refills | Status: DC

## 2018-07-09 MED ORDER — LOSARTAN POTASSIUM 25 MG PO TABS: 25.0000 mg | ORAL_TABLET | Freq: Every day | ORAL | 1 refills | Status: DC

## 2018-07-09 MED ORDER — FENOFIBRATE 160 MG PO TABS: ORAL_TABLET | ORAL | 1 refills | Status: DC

## 2018-07-09 MED ORDER — HYDROCHLOROTHIAZIDE 25 MG PO TABS: 25.0000 mg | ORAL_TABLET | Freq: Every day | ORAL | 1 refills | Status: DC

## 2018-07-09 NOTE — Assessment & Plan Note (Signed)
Rechecking levels today. Call with any concerns. Continue to monitor. Will refill based on results. Call with any concerns.

## 2018-07-09 NOTE — Progress Notes (Signed)
BP 134/75 (BP Location: Left Arm, Patient Position: Sitting, Cuff Size: Large)   Pulse 64   Temp 98.4 F (36.9 C)   Ht 6' 0.1" (1.831 m)   Wt 224 lb (101.6 kg)   SpO2 96%   BMI 30.30 kg/m    Subjective:    Patient ID: Manuel Burnett, male    DOB: December 05, 1956, 62 y.o.   MRN: [id number]  HPI: Manuel Burnett is a 62 y.o. male presenting on 07/09/2018 for comprehensive medical examination. Current medical complaints include:  HYPERTENSION / HYPERLIPIDEMIA Satisfied with current treatment? yes Duration of hypertension: chronic BP monitoring frequency: not checking BP medication side effects: no Past BP meds: losartan, amlodipine, hctz Duration of hyperlipidemia: chronic Cholesterol medication side effects: no Cholesterol supplements: none Past cholesterol medications: fenofibrate and crestor Medication compliance: excellent compliance Aspirin: no Recent stressors: no Recurrent headaches: no Visual changes: no Palpitations: no Dyspnea: no Chest pain: no Lower extremity edema: no Dizzy/lightheaded: no  DIABETES Hypoglycemic episodes:no Polydipsia/polyuria: no Visual disturbance: no Chest pain: no Paresthesias: no Glucose Monitoring: yes  Accucheck frequency: occasionally Taking Insulin?: no Blood Pressure Monitoring: not checking Retinal Examination: Not up to Date Foot Exam: Done today Diabetic Education: Completed Pneumovax: Up to Date Influenza: Up to Date Aspirin: no  HYPOTHYROIDISM Thyroid control status:controlled Satisfied with current treatment? yes Medication side effects: no Medication compliance: excellent compliance Etiology of hypothyroidism:  Recent dose adjustment:no Fatigue: yes Cold intolerance: no Heat intolerance: no Weight gain: no Weight loss: no Constipation: no Diarrhea/loose stools: no Palpitations: no Lower extremity edema: no Anxiety/depressed mood: no  LOW TESTOSTERONE Duration: chronic Status: controlled  Satisfied  with current treatment:  yes Previous testosterone therapies: Medication side effects:  no Medication compliance: excellent compliance Decreased libido: no Fatigue: no Depressed mood: no Muscle weakness: no Erectile dysfunction: no  He currently lives with: Wife Interim Problems from his last visit: no  Depression Screen done today and results listed below:  Depression screen [facility] 2/9 07/09/2018 04/03/2017 04/03/2017 02/02/2016  Decreased Interest 0 0 0 1  Down, Depressed, Hopeless 0 0 0 1  PHQ - 2 Score 0 0 0 2  Altered sleeping 0 0 - 0  Tired, decreased energy 0 0 - 1  Change in appetite 0 0 - 1  Feeling bad or failure about yourself  0 0 - 0  Trouble concentrating 0 0 - 0  Moving slowly or fidgety/restless 0 0 - 0  Suicidal thoughts 0 0 - 0  PHQ-9 Score 0 0 - 4  Difficult doing work/chores Not difficult at all - - Somewhat difficult    Past Medical History:  Past Medical History:  Diagnosis Date  . Cancer ([place])    SKIN  . Chronic kidney disease   . Diabetes mellitus without complication ([place])   . Erectile dysfunction   . GERD (gastroesophageal reflux disease)   . Hyperlipidemia   . Hypertension   . Hypogonadism male   . Hypothyroidism   . Proteinuria   . Sleep apnea    CPAP  . Thyroid disease     Surgical History:  Past Surgical History:  Procedure Laterality Date  . CATARACT EXTRACTION W/PHACO Right 12/26/2017   Procedure: CATARACT EXTRACTION PHACO AND INTRAOCULAR LENS PLACEMENT (IOC);  Surgeon: Birder Robson, MD;  Location: [facility] ORS;  Service: Ophthalmology;  Laterality: Right;  [place] 00:21.8 AP% 21.1 CDE 2.64 FLUID PACK LOT # [id number]  . CATARACT EXTRACTION W/PHACO Left 01/09/2018   Procedure: CATARACT EXTRACTION PHACO AND INTRAOCULAR LENS  PLACEMENT (IOC);  Surgeon: Birder Robson, MD;  Location: [facility] ORS;  Service: Ophthalmology;  Laterality: Left;  [place] 00:28.9 AP% 10.0 CDE 2.90 Fluid pcak lot # [id number]  . CLAVICLE SURGERY     internal fixation  . EYE  SURGERY Bilateral 12/2017   Cataract  . HEMORRHOID SURGERY    . TONSILLECTOMY AND ADENOIDECTOMY      Medications:  Current Outpatient Medications on File Prior to Visit  Medication Sig  . levothyroxine (SYNTHROID, LEVOTHROID) 150 MCG tablet Take 1 tablet (150 mcg total) by mouth daily.  . Multiple Vitamin (MULTIVITAMIN WITH MINERALS) TABS tablet Take 1 tablet by mouth daily.  . ranitidine (ZANTAC) 150 MG tablet Take 150 mg by mouth 2 (two) times daily as needed for heartburn.   . sildenafil (VIAGRA) 100 MG tablet Take 1 tablet (100 mg total) by mouth daily as needed for erectile dysfunction. (Patient not taking: Reported on 01/04/2018)  . testosterone cypionate (DEPOTESTOSTERONE CYPIONATE) 200 MG/ML injection INJECT 0.5 ML INTRAMUSCULARLY EVERY 14 DAYS  . vitamin C (ASCORBIC ACID) 250 MG tablet Take 250 mg by mouth daily.   No current facility-administered medications on file prior to visit.     Allergies:  Allergies  Allergen Reactions  . Codeine Sulfate Rash    Social History:  Social History   Socioeconomic History  . Marital status: Married    Spouse name: Not on file  . Number of children: Not on file  . Years of education: Not on file  . Highest education level: Not on file  Occupational History  . Not on file  Social Needs  . Financial resource strain: Not on file  . Food insecurity:    Worry: Not on file    Inability: Not on file  . Transportation needs:    Medical: Not on file    Non-medical: Not on file  Tobacco Use  . Smoking status: Current Every Day Smoker    Packs/day: 0.50  . Smokeless tobacco: Never Used  Substance and Sexual Activity  . Alcohol use: No    Alcohol/week: 0.0 oz    Frequency: Never  . Drug use: No  . Sexual activity: Not Currently  Lifestyle  . Physical activity:    Days per week: Not on file    Minutes per session: Not on file  . Stress: Not on file  Relationships  . Social connections:    Talks on phone: Not on file    Gets  together: Not on file    Attends religious service: Not on file    Active member of club or organization: Not on file    Attends meetings of clubs or organizations: Not on file    Relationship status: Not on file  . Intimate partner violence:    Fear of current or ex partner: Not on file    Emotionally abused: Not on file    Physically abused: Not on file    Forced sexual activity: Not on file  Other Topics Concern  . Not on file  Social History Narrative  . Not on file   Social History   Tobacco Use  Smoking Status Current Every Day Smoker  . Packs/day: 0.50  Smokeless Tobacco Never Used   Social History   Substance and Sexual Activity  Alcohol Use No  . Alcohol/week: 0.0 oz  . Frequency: Never    Family History:  Family History  Problem Relation Age of Onset  . Hypertension Mother   . Heart disease Father   .  Heart attack Father   . CAD Maternal Grandmother   . Cancer Maternal Grandfather     Past medical history, surgical history, medications, allergies, family history and social history reviewed with patient today and changes made to appropriate areas of the chart.   Review of Systems  Constitutional: Negative.        +Night sweats  HENT: Negative.   Eyes: Negative.   Respiratory: Negative.   Cardiovascular: Negative.   Gastrointestinal: Negative.   Genitourinary: Negative.   Musculoskeletal: Negative.   Skin: Positive for rash. Negative for itching.  Neurological: Negative.   Endo/Heme/Allergies: Negative.   Psychiatric/Behavioral: Negative.     All other ROS negative except what is listed above and in the HPI.      Objective:    BP 134/75 (BP Location: Left Arm, Patient Position: Sitting, Cuff Size: Large)   Pulse 64   Temp 98.4 F (36.9 C)   Ht 6' 0.1" (1.831 m)   Wt 224 lb (101.6 kg)   SpO2 96%   BMI 30.30 kg/m   Wt Readings from Last 3 Encounters:  07/09/18 224 lb (101.6 kg)  01/09/18 217 lb (98.4 kg)  12/26/17 217 lb (98.4 kg)      Physical Exam  Constitutional: He is oriented to person, place, and time. He appears well-developed and well-nourished. No distress.  HENT:  Head: Normocephalic and atraumatic.  Right Ear: Hearing, tympanic membrane, external ear and ear canal normal.  Left Ear: Hearing, tympanic membrane, external ear and ear canal normal.  Nose: Nose normal.  Mouth/Throat: Uvula is midline, oropharynx is clear and moist and mucous membranes are normal. No oropharyngeal exudate.  Eyes: Pupils are equal, round, and reactive to light. Conjunctivae, EOM and lids are normal. Right eye exhibits no discharge. Left eye exhibits no discharge. No scleral icterus.  Neck: Normal range of motion. Neck supple. No JVD present. No tracheal deviation present. No thyromegaly present.  Cardiovascular: Normal rate, regular rhythm, normal heart sounds and intact distal pulses. Exam reveals no gallop and no friction rub.  No murmur heard. Pulmonary/Chest: Effort normal and breath sounds normal. No stridor. No respiratory distress. He has no wheezes. He has no rales. He exhibits no tenderness.  Abdominal: Soft. Bowel sounds are normal. He exhibits no distension and no mass. There is no tenderness. There is no rebound and no guarding. No hernia. Hernia confirmed negative in the right inguinal area and confirmed negative in the left inguinal area.  Genitourinary: Rectum normal, prostate normal, testes normal and penis normal. Rectal exam shows no external hemorrhoid, no internal hemorrhoid, no fissure, no mass, no tenderness and anal tone normal. Prostate is not enlarged and not tender. Cremasteric reflex is present. Right testis shows no mass, no swelling and no tenderness. Right testis is descended. Cremasteric reflex is not absent on the right side. Left testis shows no mass, no swelling and no tenderness. Left testis is descended. Cremasteric reflex is not absent on the left side. Circumcised. No phimosis, paraphimosis, hypospadias,  penile erythema or penile tenderness. No discharge found.  Musculoskeletal: Normal range of motion. He exhibits no edema, tenderness or deformity.  Lymphadenopathy:    He has no cervical adenopathy. No inguinal adenopathy noted on the right or left side.  Neurological: He is alert and oriented to person, place, and time. He displays normal reflexes. No cranial nerve deficit or sensory deficit. He exhibits normal muscle tone. Coordination normal.  Skin: Skin is warm, dry and intact. Capillary refill takes less than 2  seconds. Rash (centrally clearing raised erythematous lesions on his belly and top of R foot, patches of erythematous raised papules on legs and arms) noted. He is not diaphoretic. No erythema. No pallor.  Psychiatric: He has a normal mood and affect. His speech is normal and behavior is normal. Judgment and thought content normal. Cognition and memory are normal.  Nursing note and vitals reviewed.   Results for orders placed or performed in visit on 07/09/18  Hemoglobin A1c  Result Value Ref Range   Hemoglobin A1C 6.9       Assessment & Plan:   Problem List Items Addressed This Visit      Endocrine   Diabetes mellitus due to underlying condition with diabetic neuropathy ([place])    A1c elevated at 6.9- will continue current regimen. Continue to monitor. Call with any concerns. Continue to monitor. Recheck 3 months.       Relevant Medications   Dulaglutide (TRULICITY) 9.56 LO/7.5IE SOPN   rosuvastatin (CRESTOR) 10 MG tablet   losartan (COZAAR) 25 MG tablet   SitaGLIPtin-MetFORMIN HCl (JANUMET XR) 50-1000 MG TB24   Other Relevant Orders   Bayer DCA Hb A1c Waived   CBC with Differential/Platelet   Comprehensive metabolic panel   Microalbumin, Urine Waived   UA/M w/rflx Culture, Routine   Male hypogonadism    Rechecking levels today. Call with any concerns. Continue to monitor. Will refill based on results. Call with any concerns.       Relevant Orders   CBC with  Differential/Platelet   Comprehensive metabolic panel   UA/M w/rflx Culture, Routine   Testosterone, free, total([facility])   Hypothyroidism    Rechecking levels today. Call with any concerns. Continue to monitor. Will refill based on results. Call with any concerns.       Relevant Orders   CBC with Differential/Platelet   Comprehensive metabolic panel   TSH   UA/M w/rflx Culture, Routine   Secondary hyperparathyroidism ([place])    Stable. Continue current regimen. Continue to monitor. Call with any concerns. Checking PTH today.      Relevant Orders   CBC with Differential/Platelet   Comprehensive metabolic panel   UA/M w/rflx Culture, Routine   PTH, Intact and Calcium     Genitourinary   Benign hypertensive renal disease    Under good control. Continue current regimen. Continue to monitor. Call with any concerns.       Relevant Orders   CBC with Differential/Platelet   Comprehensive metabolic panel   Microalbumin, Urine Waived   UA/M w/rflx Culture, Routine   Chronic kidney disease, stage II (mild)    Rechecking levels today. Await results. Call with any concerns.       Relevant Orders   CBC with Differential/Platelet   Comprehensive metabolic panel   UA/M w/rflx Culture, Routine     Other   Hyperlipemia    Under good control. Continue current regimen. Continue to monitor. Call with any concerns.       Relevant Medications   rosuvastatin (CRESTOR) 10 MG tablet   losartan (COZAAR) 25 MG tablet   hydrochlorothiazide (HYDRODIURIL) 25 MG tablet   fenofibrate 160 MG tablet   amLODipine (NORVASC) 10 MG tablet   Other Relevant Orders   CBC with Differential/Platelet   Comprehensive metabolic panel   Lipid Panel w/o Chol/HDL Ratio   UA/M w/rflx Culture, Routine    Other Visit Diagnoses    Routine general medical examination at a health care facility    -  Primary  Vaccines up to date. Screening labs checked today. Colonoscopy up to date. Continue diet and  exercise. Call with any concerns.    Screening for prostate cancer       Labs drawn today. Await results.    Relevant Orders   PSA   Tinea corporis       Will treat with lotrisone. Call with any concerns or if not getting better.    Relevant Medications   clotrimazole-betamethasone (LOTRISONE) cream   Contact dermatitis, unspecified contact dermatitis type, unspecified trigger       Will treat with trimacinalone. Call with any concerns or if not getting better.        LABORATORY TESTING:  Health maintenance labs ordered today as discussed above.   The natural history of prostate cancer and ongoing controversy regarding screening and potential treatment outcomes of prostate cancer has been discussed with the patient. The meaning of a false positive PSA and a false negative PSA has been discussed. He indicates understanding of the limitations of this screening test and wishes to proceed with screening PSA testing.  IMMUNIZATIONS:   - Tdap: Tetanus vaccination status reviewed: last tetanus booster within 10 years. - Influenza: Postponed to flu season - Pneumovax: Up to date  SCREENING: - Colonoscopy: Up to date  Discussed with patient purpose of the colonoscopy is to detect colon cancer at curable precancerous or early stages   PATIENT COUNSELING:    Sexuality: Discussed sexually transmitted diseases, partner selection, use of condoms, avoidance of unintended pregnancy  and contraceptive alternatives.   Advised to avoid cigarette smoking.  I discussed with the patient that most people either abstain from alcohol or drink within safe limits (<=14/week and <=4 drinks/occasion for males, <=7/weeks and <= 3 drinks/occasion for females) and that the risk for alcohol disorders and other health effects rises proportionally with the number of drinks per week and how often a drinker exceeds daily limits.  Discussed cessation/primary prevention of drug use and availability of treatment for  abuse.   Diet: Encouraged to adjust caloric intake to maintain  or achieve ideal body weight, to reduce intake of dietary saturated fat and total fat, to limit sodium intake by avoiding high sodium foods and not adding table salt, and to maintain adequate dietary potassium and calcium preferably from fresh fruits, vegetables, and low-fat dairy products.    stressed the importance of regular exercise  Injury prevention: Discussed safety belts, safety helmets, smoke detector, smoking near bedding or upholstery.   Dental health: Discussed importance of regular tooth brushing, flossing, and dental visits.   Follow up plan: NEXT PREVENTATIVE PHYSICAL DUE IN 1 YEAR. Return in about 3 months (around 10/09/2018) for DM follow up.

## 2018-07-09 NOTE — Assessment & Plan Note (Signed)
Under good control. Continue current regimen. Continue to monitor. Call with any concerns. 

## 2018-07-09 NOTE — Assessment & Plan Note (Signed)
A1c elevated at 6.9- will continue current regimen. Continue to monitor. Call with any concerns. Continue to monitor. Recheck 3 months.

## 2018-07-09 NOTE — Patient Instructions (Signed)

## 2018-07-09 NOTE — Assessment & Plan Note (Signed)
Rechecking levels today. Await results. Call with any concerns.  

## 2018-07-09 NOTE — Assessment & Plan Note (Signed)
Stable. Continue current regimen. Continue to monitor. Call with any concerns. Checking PTH today.

## 2018-07-10 ENCOUNTER — Other Ambulatory Visit: Payer: Self-pay | Admitting: Family Medicine

## 2018-07-10 ENCOUNTER — Encounter: Payer: Self-pay | Admitting: Family Medicine

## 2018-07-10 DIAGNOSIS — E039 Hypothyroidism, unspecified: Secondary | ICD-10-CM

## 2018-07-10 LAB — CBC WITH DIFFERENTIAL/PLATELET
BASOS: 1 %
Basophils Absolute: 0 10*3/uL (ref 0.0–0.2)
EOS (ABSOLUTE): 0.2 10*3/uL (ref 0.0–0.4)
EOS: 4 %
HEMATOCRIT: 43.3 % (ref 37.5–51.0)
HEMOGLOBIN: 14.7 g/dL (ref 13.0–17.7)
Immature Grans (Abs): 0 10*3/uL (ref 0.0–0.1)
Immature Granulocytes: 0 %
LYMPHS ABS: 1.5 10*3/uL (ref 0.7–3.1)
Lymphs: 23 %
MCH: 29.7 pg (ref 26.6–33.0)
MCHC: 33.9 g/dL (ref 31.5–35.7)
MCV: 88 fL (ref 79–97)
Monocytes Absolute: 0.6 10*3/uL (ref 0.1–0.9)
Monocytes: 9 %
NEUTROS ABS: 4.2 10*3/uL (ref 1.4–7.0)
Neutrophils: 63 %
Platelets: 165 10*3/uL (ref 150–450)
RBC: 4.95 x10E6/uL (ref 4.14–5.80)
RDW: 13.6 % (ref 12.3–15.4)
WBC: 6.6 10*3/uL (ref 3.4–10.8)

## 2018-07-10 LAB — COMPREHENSIVE METABOLIC PANEL
ALBUMIN: 4.5 g/dL (ref 3.6–4.8)
ALK PHOS: 53 IU/L (ref 39–117)
ALT: 10 IU/L (ref 0–44)
AST: 14 IU/L (ref 0–40)
Albumin/Globulin Ratio: 2.3 — ABNORMAL HIGH (ref 1.2–2.2)
BUN/Creatinine Ratio: 15 (ref 10–24)
BUN: 19 mg/dL (ref 8–27)
Bilirubin Total: 0.3 mg/dL (ref 0.0–1.2)
CHLORIDE: 99 mmol/L (ref 96–106)
CO2: 23 mmol/L (ref 20–29)
CREATININE: 1.23 mg/dL (ref 0.76–1.27)
Calcium: 9.2 mg/dL (ref 8.6–10.2)
GFR calc non Af Amer: 63 mL/min/{1.73_m2} (ref >59)
GFR, EST AFRICAN AMERICAN: 73 mL/min/{1.73_m2} (ref >59)
GLOBULIN, TOTAL: 2 g/dL (ref 1.5–4.5)
Glucose: 151 mg/dL — ABNORMAL HIGH (ref 65–99)
Potassium: 3.6 mmol/L (ref 3.5–5.2)
Sodium: 138 mmol/L (ref 134–144)
Total Protein: 6.5 g/dL (ref 6.0–8.5)

## 2018-07-10 LAB — TESTOSTERONE, FREE, TOTAL, SHBG
SEX HORMONE BINDING: 34.7 nmol/L (ref 19.3–76.4)
TESTOSTERONE: 186 ng/dL — AB (ref 264–916)
Testosterone, Free: 3.7 pg/mL — ABNORMAL LOW (ref 6.6–18.1)

## 2018-07-10 LAB — PTH, INTACT AND CALCIUM: PTH: 27 pg/mL (ref 15–65)

## 2018-07-10 LAB — TSH: TSH: 9.04 u[IU]/mL — AB (ref 0.450–4.500)

## 2018-07-10 LAB — PSA: PROSTATE SPECIFIC AG, SERUM: 1.8 ng/mL (ref 0.0–4.0)

## 2018-07-10 LAB — LIPID PANEL W/O CHOL/HDL RATIO
Cholesterol, Total: 169 mg/dL (ref 100–199)
HDL: 32 mg/dL — AB (ref >39)
Triglycerides: 489 mg/dL — ABNORMAL HIGH (ref 0–149)

## 2018-07-10 MED ORDER — LEVOTHYROXINE SODIUM 175 MCG PO TABS: 175.0000 ug | ORAL_TABLET | Freq: Every day | ORAL | 2 refills | Status: DC

## 2018-07-10 MED ORDER — TESTOSTERONE CYPIONATE 200 MG/ML IM SOLN: INTRAMUSCULAR | 5 refills | Status: DC

## 2018-07-13 ENCOUNTER — Telehealth: Payer: Self-pay

## 2018-07-13 NOTE — Telephone Encounter (Signed)
Prior authorization for trulicity was initiated via [email or web address]. Key: [id number]

## 2018-08-13 LAB — HM DIABETES EYE EXAM

## 2018-09-24 ENCOUNTER — Telehealth: Payer: Self-pay | Admitting: Family Medicine

## 2018-09-24 NOTE — Telephone Encounter (Signed)
Spoke with pt and told him that I would send a message to Dr Wynetta Emery about getting refills for his medications every month due to cost after insurance. He stated that he could afford his medications this way but would like to know if he could have samples of trulicity because he has been out for a couple weeks. After speaking with the pt for a while he rescheduled his appt and stated that he would come in and will see Dr Wynetta Emery on 10/15/18.

## 2018-09-24 NOTE — Telephone Encounter (Signed)
He can have samples if we have them (2)

## 2018-09-24 NOTE — Telephone Encounter (Signed)
Copied from [place] [id number]. Topic: Quick Communication - See Telephone Encounter >> Sep 24, 2018  2:46 PM Gardiner Ramus wrote: CRM for notification. See Telephone encounter for: 09/24/18. Patient called and stated that his insurance has changed and can not make appointment . Pt would like to know of any suggestions for medication. Please advise Cb#[phone]

## 2018-09-25 NOTE — Telephone Encounter (Signed)
Mychart message sent.

## 2018-10-15 ENCOUNTER — Other Ambulatory Visit: Payer: Self-pay

## 2018-10-15 ENCOUNTER — Ambulatory Visit (INDEPENDENT_AMBULATORY_CARE_PROVIDER_SITE_OTHER): Payer: BLUE CROSS/BLUE SHIELD | Admitting: Family Medicine

## 2018-10-15 ENCOUNTER — Ambulatory Visit: Payer: BLUE CROSS/BLUE SHIELD | Admitting: Family Medicine

## 2018-10-15 ENCOUNTER — Encounter: Payer: Self-pay | Admitting: Family Medicine

## 2018-10-15 VITALS — BP 144/88 | HR 60 | Temp 98.0°F | Wt 229.4 lb

## 2018-10-15 DIAGNOSIS — B354 Tinea corporis: Secondary | ICD-10-CM

## 2018-10-15 DIAGNOSIS — E0840 Diabetes mellitus due to underlying condition with diabetic neuropathy, unspecified: Secondary | ICD-10-CM

## 2018-10-15 MED ORDER — DULAGLUTIDE 1.5 MG/0.5ML ~~LOC~~ SOAJ: 1.5000 mg | SUBCUTANEOUS | 3 refills | Status: DC

## 2018-10-15 NOTE — Assessment & Plan Note (Signed)
A1c up to 7.3. Will increase trulicity to 1.5mg  and recheck in 3 months. Call with any concerns.

## 2018-10-15 NOTE — Progress Notes (Signed)
BP (!) 144/88 (BP Location: Right Arm, Cuff Size: Normal)   Pulse 60   Temp 98 F (36.7 C)   Wt 229 lb 6.4 oz (104.1 kg) Comment: with boots  SpO2 98%   BMI 31.03 kg/m    Subjective:    Patient ID: Manuel Burnett, male    DOB: 10-21-1956, 62 y.o.   MRN: [id number]  HPI: Manuel Burnett is a 62 y.o. male  Chief Complaint  Patient presents with  . Diabetes  . Rash   DIABETES Hypoglycemic episodes:no Polydipsia/polyuria: no Visual disturbance: no Chest pain: no Paresthesias: no Glucose Monitoring: yes  Accucheck frequency: Daily  Fasting glucose: 147 Taking Insulin?: no Blood Pressure Monitoring: not checking Retinal Examination: Up to Date Foot Exam: Up to Date Diabetic Education: Completed Pneumovax: Up to Date Influenza: Up to Date Aspirin: no  RASH- on hands and back x 3 weeks. Itching a lot, crusting, has been oozing, has bene using Rx cream on hand, not 100% clearing up Duration:  3 weeks  Location: back and hands  Itching: yes Burning: no Redness: no Oozing: yes Scaling: yes Blisters: no Painful: no Fevers: no Change in detergents/soaps/personal care products: no Recent illness: no Recent travel:no History of same: no Context: stable Alleviating factors: nothing Treatments attempted:OTC anit-fungal and lotion/moisturizer Shortness of breath: no  Throat/tongue swelling: no Myalgias/arthralgias: no  Relevant past medical, surgical, family and social history reviewed and updated as indicated. Interim medical history since our last visit reviewed. Allergies and medications reviewed and updated.  Review of Systems  Constitutional: Negative.   Respiratory: Negative.   Cardiovascular: Negative.   Neurological: Negative.   Psychiatric/Behavioral: Negative.     Per HPI unless specifically indicated above     Objective:    BP (!) 144/88 (BP Location: Right Arm, Cuff Size: Normal)   Pulse 60   Temp 98 F (36.7 C)   Wt 229 lb 6.4 oz (104.1  kg) Comment: with boots  SpO2 98%   BMI 31.03 kg/m   Wt Readings from Last 3 Encounters:  10/15/18 229 lb 6.4 oz (104.1 kg)  07/09/18 224 lb (101.6 kg)  01/09/18 217 lb (98.4 kg)    Physical Exam  Constitutional: He is oriented to person, place, and time. He appears well-developed and well-nourished. No distress.  HENT:  Head: Normocephalic and atraumatic.  Right Ear: Hearing normal.  Left Ear: Hearing normal.  Nose: Nose normal.  Eyes: Conjunctivae and lids are normal. Right eye exhibits no discharge. Left eye exhibits no discharge. No scleral icterus.  Cardiovascular: Normal rate, regular rhythm, normal heart sounds and intact distal pulses. Exam reveals no gallop and no friction rub.  No murmur heard. Pulmonary/Chest: Effort normal and breath sounds normal. No stridor. No respiratory distress. He has no wheezes. He has no rales. He exhibits no tenderness.  Musculoskeletal: Normal range of motion.  Neurological: He is alert and oriented to person, place, and time.  Skin: Skin is warm, dry and intact. Capillary refill takes less than 2 seconds. No rash noted. He is not diaphoretic. No erythema. No pallor.  Psychiatric: He has a normal mood and affect. His speech is normal and behavior is normal. Judgment and thought content normal. Cognition and memory are normal.  Nursing note and vitals reviewed.   Results for orders placed or performed in visit on 08/14/18  HM DIABETES EYE EXAM  Result Value Ref Range   HM Diabetic Eye Exam No Retinopathy No Retinopathy      Assessment &  Plan:   Problem List Items Addressed This Visit      Endocrine   Diabetes mellitus due to underlying condition with diabetic neuropathy ([place]) - Primary    A1c up to 7.3. Will increase trulicity to 1.5mg  and recheck in 3 months. Call with any concerns.       Relevant Medications   Dulaglutide (TRULICITY) 1.5 QS/1.2KS SOPN    Other Visit Diagnoses    Tinea corporis       Will treat with ketoconazole  shampoo. Call with any concerns.        Follow up plan: Return in about 3 months (around 01/15/2019) for Follow up.

## 2018-10-25 ENCOUNTER — Other Ambulatory Visit: Payer: Self-pay | Admitting: Family Medicine

## 2018-10-25 NOTE — Telephone Encounter (Signed)
Requested medication (s) are due for refill today yes  Requested medication (s) are on the active medication list yes  Future visit scheduled yes Last abnormal TSH 07/09/18. Protocol is TSH follow up within 3 months.  Requested Prescriptions  Pending Prescriptions Disp Refills   levothyroxine (SYNTHROID, LEVOTHROID) 175 MCG tablet [Pharmacy Med Name: LEVOTHYROXINE 0.175MG  (175MCG) TABS] 30 tablet 0    Sig: TAKE 1 TABLET(175 MCG) BY MOUTH DAILY     Endocrinology:  Hypothyroid Agents Failed - 10/25/2018 10:11 AM      Failed - TSH needs to be rechecked within 3 months after an abnormal result. Refill until TSH is due.      Failed - TSH in normal range and within 360 days    TSH  Date Value Ref Range Status  07/09/2018 9.040 (H) 0.450 - 4.500 uIU/mL Final         Passed - Valid encounter within last 12 months    Recent Outpatient Visits          1 week ago Diabetes mellitus due to underlying condition with diabetic neuropathy, without long-term current use of insulin ([place])   [place], Megan P, DO   3 months ago Routine general medical examination at a health care facility   [facility], [place] P, DO   1 year ago Benign hypertensive renal disease   [facility] [place], Megan P, DO   1 year ago Diabetes mellitus due to underlying condition with diabetic neuropathy, without long-term current use of insulin ([place])   [place], [place], DO   1 year ago Routine general medical examination at a health care facility   [place], Barb Merino, DO      Future Appointments            In 2 months Wynetta Emery, Barb Merino, DO [facility]

## 2018-10-25 NOTE — Telephone Encounter (Signed)
Patient notified, he will come in Monday morning

## 2018-10-25 NOTE — Telephone Encounter (Signed)
Needs to come in to get his labs drawn.

## 2018-10-29 ENCOUNTER — Other Ambulatory Visit: Payer: BLUE CROSS/BLUE SHIELD

## 2018-10-29 ENCOUNTER — Telehealth: Payer: Self-pay | Admitting: Family Medicine

## 2018-10-29 DIAGNOSIS — E039 Hypothyroidism, unspecified: Secondary | ICD-10-CM | POA: Diagnosis not present

## 2018-10-29 NOTE — Telephone Encounter (Signed)
Patient had labs done today and would like for Dr Wynetta Emery to give him a call back when the results are in.   Thank You

## 2018-10-30 ENCOUNTER — Telehealth: Payer: Self-pay | Admitting: Family Medicine

## 2018-10-30 DIAGNOSIS — E039 Hypothyroidism, unspecified: Secondary | ICD-10-CM

## 2018-10-30 LAB — TSH: TSH: 4.5 u[IU]/mL (ref 0.450–4.500)

## 2018-10-30 MED ORDER — LEVOTHYROXINE SODIUM 200 MCG PO TABS: 200.0000 ug | ORAL_TABLET | Freq: Every day | ORAL | 2 refills | Status: DC

## 2018-10-30 NOTE — Telephone Encounter (Signed)
Please let him know that his thyroid was better, but still slightly high- I'm going to increase his thyroid medicine to 229mcg and we'll recheck it in 6 weeks. Thanks!

## 2018-10-30 NOTE — Telephone Encounter (Signed)
Patient notified and verbalized understanding. 

## 2019-01-09 ENCOUNTER — Other Ambulatory Visit: Payer: Self-pay | Admitting: Family Medicine

## 2019-01-09 NOTE — Telephone Encounter (Signed)
Approved according to protocol

## 2019-01-14 ENCOUNTER — Ambulatory Visit: Payer: BLUE CROSS/BLUE SHIELD | Admitting: Family Medicine

## 2019-01-18 ENCOUNTER — Telehealth: Payer: Self-pay

## 2019-01-18 NOTE — Telephone Encounter (Signed)
PA for Janumet was initiated and approved via Cover My Meds.

## 2019-01-21 ENCOUNTER — Telehealth: Payer: Self-pay

## 2019-01-21 ENCOUNTER — Encounter: Payer: Self-pay | Admitting: Family Medicine

## 2019-01-21 ENCOUNTER — Ambulatory Visit: Payer: Managed Care, Other (non HMO) | Admitting: Family Medicine

## 2019-01-21 VITALS — BP 154/70 | HR 58 | Temp 98.0°F | Wt 229.1 lb

## 2019-01-21 DIAGNOSIS — I129 Hypertensive chronic kidney disease with stage 1 through stage 4 chronic kidney disease, or unspecified chronic kidney disease: Secondary | ICD-10-CM

## 2019-01-21 DIAGNOSIS — Z23 Encounter for immunization: Secondary | ICD-10-CM | POA: Diagnosis not present

## 2019-01-21 DIAGNOSIS — L309 Dermatitis, unspecified: Secondary | ICD-10-CM

## 2019-01-21 DIAGNOSIS — E782 Mixed hyperlipidemia: Secondary | ICD-10-CM | POA: Diagnosis not present

## 2019-01-21 DIAGNOSIS — N182 Chronic kidney disease, stage 2 (mild): Secondary | ICD-10-CM | POA: Diagnosis not present

## 2019-01-21 DIAGNOSIS — E039 Hypothyroidism, unspecified: Secondary | ICD-10-CM

## 2019-01-21 DIAGNOSIS — E291 Testicular hypofunction: Secondary | ICD-10-CM

## 2019-01-21 DIAGNOSIS — E0840 Diabetes mellitus due to underlying condition with diabetic neuropathy, unspecified: Secondary | ICD-10-CM | POA: Diagnosis not present

## 2019-01-21 LAB — BAYER DCA HB A1C WAIVED: HB A1C: 7.5 % — AB (ref <7.0)

## 2019-01-21 MED ORDER — ROSUVASTATIN CALCIUM 10 MG PO TABS: 10.0000 mg | ORAL_TABLET | Freq: Every day | ORAL | 1 refills | Status: DC

## 2019-01-21 MED ORDER — ALBUTEROL SULFATE HFA 108 (90 BASE) MCG/ACT IN AERS: 2.0000 | INHALATION_SPRAY | RESPIRATORY_TRACT | 1 refills | Status: DC | PRN

## 2019-01-21 MED ORDER — DULAGLUTIDE 1.5 MG/0.5ML ~~LOC~~ SOAJ: 1.5000 mg | SUBCUTANEOUS | 3 refills | Status: DC

## 2019-01-21 MED ORDER — HYDROCHLOROTHIAZIDE 25 MG PO TABS: ORAL_TABLET | ORAL | 1 refills | Status: DC

## 2019-01-21 MED ORDER — SITAGLIP PHOS-METFORMIN HCL ER 50-1000 MG PO TB24: 2.0000 | ORAL_TABLET | Freq: Every day | ORAL | 1 refills | Status: DC

## 2019-01-21 MED ORDER — TRIAMCINOLONE ACETONIDE 0.5 % EX OINT: 1.0000 "application " | TOPICAL_OINTMENT | Freq: Two times a day (BID) | CUTANEOUS | 3 refills | Status: DC

## 2019-01-21 MED ORDER — LOSARTAN POTASSIUM 50 MG PO TABS: 50.0000 mg | ORAL_TABLET | Freq: Every day | ORAL | 1 refills | Status: DC

## 2019-01-21 MED ORDER — AMLODIPINE BESYLATE 10 MG PO TABS: ORAL_TABLET | ORAL | 1 refills | Status: DC

## 2019-01-21 MED ORDER — SILDENAFIL CITRATE 100 MG PO TABS: 100.0000 mg | ORAL_TABLET | Freq: Every day | ORAL | 12 refills | Status: DC | PRN

## 2019-01-21 MED ORDER — TESTOSTERONE CYPIONATE 200 MG/ML IM SOLN: INTRAMUSCULAR | 5 refills | Status: DC

## 2019-01-21 NOTE — Assessment & Plan Note (Signed)
Under good control on current regimen. Continue current regimen. Continue to monitor. Call with any concerns. Refills given. Labs drawn today.   

## 2019-01-21 NOTE — Assessment & Plan Note (Signed)
Rechecking levels today. Await results.  

## 2019-01-21 NOTE — Progress Notes (Signed)
BP (!) 154/70 (BP Location: Left Arm, Cuff Size: Normal)   Pulse (!) 58   Temp 98 F (36.7 C) (Oral)   Wt 229 lb 1.6 oz (103.9 kg)   SpO2 97%   BMI 30.99 kg/m    Subjective:    Patient ID: Manuel Burnett, male    DOB: [birth year], 63 y.o.   MRN: [id number]  HPI: Manuel Burnett is a 63 y.o. male  Chief Complaint  Patient presents with  . Diabetes  . Hyperlipidemia  . Hypertension  . bumps    pt states he gets these red, pimple like bumps all over that itch very bad   DIABETES- stopped his trulicity because he thought it was causing his rash, has been off of it for about 3 weeks.  Hypoglycemic episodes:no Polydipsia/polyuria: no Visual disturbance: no Chest pain: no Paresthesias: no Glucose Monitoring: no Taking Insulin?: no Blood Pressure Monitoring: not checking Retinal Examination: Up to Date Foot Exam: Up to Date Diabetic Education: Completed Pneumovax: Up to Date Influenza: Up to Date Aspirin: yes  HYPERTENSION / HYPERLIPIDEMIA Satisfied with current treatment? yes Duration of hypertension: chronic BP monitoring frequency: not checking BP medication side effects: no Past BP meds: amlodipine, HCTZ, losartan Duration of hyperlipidemia: chronic Cholesterol medication side effects: no Cholesterol supplements: none Past cholesterol medications: crestor, fenofibrate Medication compliance: excellent compliance Aspirin: yes Recent stressors: no Recurrent headaches: no Visual changes: no Palpitations: no Dyspnea: no Chest pain: no Lower extremity edema: no Dizzy/lightheaded: no  RASH Duration:  weeks  Location: generalized  Itching: yes Burning: no Redness: yes Oozing: no Scaling: yes Blisters: yes Painful: no Fevers: no Change in detergents/soaps/personal care products: no Recent illness: no Recent travel:no History of same: yes Context: worse Alleviating factors: hydrocortisone cream Treatments attempted:hydrocortisone cream Shortness of  breath: no  Throat/tongue swelling: no Myalgias/arthralgias: no   Relevant past medical, surgical, family and social history reviewed and updated as indicated. Interim medical history since our last visit reviewed. Allergies and medications reviewed and updated.  Review of Systems  Constitutional: Negative.   Respiratory: Negative.   Cardiovascular: Negative.   Skin: Positive for rash. Negative for color change, pallor and wound.  Psychiatric/Behavioral: Negative.     Per HPI unless specifically indicated above     Objective:    BP (!) 154/70 (BP Location: Left Arm, Cuff Size: Normal)   Pulse (!) 58   Temp 98 F (36.7 C) (Oral)   Wt 229 lb 1.6 oz (103.9 kg)   SpO2 97%   BMI 30.99 kg/m   Wt Readings from Last 3 Encounters:  01/21/19 229 lb 1.6 oz (103.9 kg)  10/15/18 229 lb 6.4 oz (104.1 kg)  07/09/18 224 lb (101.6 kg)    Physical Exam Vitals signs and nursing note reviewed.  Constitutional:      General: He is not in acute distress.    Appearance: Normal appearance. He is not ill-appearing, toxic-appearing or diaphoretic.  HENT:     Head: Normocephalic and atraumatic.     Right Ear: External ear normal.     Left Ear: External ear normal.     Nose: Nose normal.     Mouth/Throat:     Mouth: Mucous membranes are moist.     Pharynx: Oropharynx is clear.  Eyes:     General: No scleral icterus.       Right eye: No discharge.        Left eye: No discharge.     Extraocular Movements: Extraocular movements  intact.     Conjunctiva/sclera: Conjunctivae normal.     Pupils: Pupils are equal, round, and reactive to light.  Neck:     Musculoskeletal: Normal range of motion and neck supple.  Cardiovascular:     Rate and Rhythm: Normal rate and regular rhythm.     Pulses: Normal pulses.     Heart sounds: Normal heart sounds. No murmur. No friction rub. No gallop.   Pulmonary:     Effort: Pulmonary effort is normal. No respiratory distress.     Breath sounds: Normal breath  sounds. No stridor. No wheezing, rhonchi or rales.  Chest:     Chest wall: No tenderness.  Musculoskeletal: Normal range of motion.  Skin:    General: Skin is warm and dry.     Capillary Refill: Capillary refill takes less than 2 seconds.     Coloration: Skin is not jaundiced or pale.     Findings: No bruising, erythema, lesion or rash.  Neurological:     General: No focal deficit present.     Mental Status: He is alert and oriented to person, place, and time. Mental status is at baseline.  Psychiatric:        Mood and Affect: Mood normal.        Behavior: Behavior normal.        Thought Content: Thought content normal.        Judgment: Judgment normal.     Results for orders placed or performed in visit on 10/29/18  TSH  Result Value Ref Range   TSH 4.500 0.450 - 4.500 uIU/mL      Assessment & Plan:   Problem List Items Addressed This Visit      Endocrine   Diabetes mellitus due to underlying condition with diabetic neuropathy ([place]) - Primary    Worsened with A1c of 7.5. Will restart his trulicity and recheck 3 months. Call with any concerns.       Relevant Medications   losartan (COZAAR) 50 MG tablet   SitaGLIPtin-MetFORMIN HCl (JANUMET XR) 50-1000 MG TB24   rosuvastatin (CRESTOR) 10 MG tablet   Dulaglutide (TRULICITY) 1.5 TT/0.1XB SOPN   Other Relevant Orders   Bayer DCA Hb A1c Waived   Comprehensive metabolic panel   Male hypogonadism    Under good control on current regimen. Continue current regimen. Continue to monitor. Call with any concerns. Refills given. Labs drawn today.       Relevant Orders   Testosterone, free, total([facility])   Hypothyroidism    Rechecking levels today. Await results.         Genitourinary   Benign hypertensive renal disease    Not under good control. Will increase his losartan to 50mg  and recheck 3 months.       Relevant Orders   Comprehensive metabolic panel   Chronic kidney disease, stage II (mild)    Rechecking  levels today. Await results.      Relevant Orders   Comprehensive metabolic panel     Other   Hyperlipemia    Under good control on current regimen. Continue current regimen. Continue to monitor. Call with any concerns. Refills given. Labs drawn today.       Relevant Medications   losartan (COZAAR) 50 MG tablet   rosuvastatin (CRESTOR) 10 MG tablet   sildenafil (VIAGRA) 100 MG tablet   hydrochlorothiazide (HYDRODIURIL) 25 MG tablet   amLODipine (NORVASC) 10 MG tablet   Other Relevant Orders   Comprehensive metabolic panel   Lipid Panel w/o  Chol/HDL Ratio    Other Visit Diagnoses    Need for influenza vaccination       Flu shot given today.   Relevant Orders   Flu Vaccine QUAD 36+ mos IM (Completed)   Eczema, unspecified type       Triamcinalone. Discussed moisturization. Call with any concerns or if not getting better.        Follow up plan: Return in about 3 months (around 04/21/2019) for DM follow up.

## 2019-01-21 NOTE — Assessment & Plan Note (Signed)
Worsened with A1c of 7.5. Will restart his trulicity and recheck 3 months. Call with any concerns.

## 2019-01-21 NOTE — Telephone Encounter (Signed)
PA submitted via cover my meds for Trulicity, awaiting approval or denial.

## 2019-01-21 NOTE — Addendum Note (Signed)
Addended by: Valerie Roys on: 01/21/2019 09:54 AM   Modules accepted: Orders

## 2019-01-21 NOTE — Assessment & Plan Note (Signed)
Not under good control. Will increase his losartan to 50mg  and recheck 3 months.

## 2019-01-22 LAB — COMPREHENSIVE METABOLIC PANEL
ALK PHOS: 61 IU/L (ref 39–117)
ALT: 14 IU/L (ref 0–44)
AST: 11 IU/L (ref 0–40)
Albumin/Globulin Ratio: 1.8 (ref 1.2–2.2)
Albumin: 4.3 g/dL (ref 3.8–4.8)
BUN/Creatinine Ratio: 12 (ref 10–24)
BUN: 15 mg/dL (ref 8–27)
Bilirubin Total: 0.3 mg/dL (ref 0.0–1.2)
CALCIUM: 9.4 mg/dL (ref 8.6–10.2)
CO2: 24 mmol/L (ref 20–29)
Chloride: 99 mmol/L (ref 96–106)
Creatinine, Ser: 1.23 mg/dL (ref 0.76–1.27)
GFR calc Af Amer: 73 mL/min/{1.73_m2} (ref >59)
GFR calc non Af Amer: 63 mL/min/{1.73_m2} (ref >59)
GLUCOSE: 224 mg/dL — AB (ref 65–99)
Globulin, Total: 2.4 g/dL (ref 1.5–4.5)
Potassium: 4 mmol/L (ref 3.5–5.2)
Sodium: 141 mmol/L (ref 134–144)
Total Protein: 6.7 g/dL (ref 6.0–8.5)

## 2019-01-22 LAB — LIPID PANEL W/O CHOL/HDL RATIO
CHOLESTEROL TOTAL: 195 mg/dL (ref 100–199)
HDL: 35 mg/dL — AB (ref >39)
TRIGLYCERIDES: 541 mg/dL — AB (ref 0–149)

## 2019-01-22 LAB — TESTOSTERONE, FREE, TOTAL, SHBG
Sex Hormone Binding: 26.4 nmol/L (ref 19.3–76.4)
Testosterone, Free: 5.7 pg/mL — ABNORMAL LOW (ref 6.6–18.1)
Testosterone: 280 ng/dL (ref 264–916)

## 2019-01-22 LAB — TSH: TSH: 8.97 u[IU]/mL — ABNORMAL HIGH (ref 0.450–4.500)

## 2019-01-23 ENCOUNTER — Telehealth: Payer: Self-pay | Admitting: Family Medicine

## 2019-01-23 ENCOUNTER — Telehealth: Payer: Self-pay

## 2019-01-23 DIAGNOSIS — E039 Hypothyroidism, unspecified: Secondary | ICD-10-CM

## 2019-01-23 NOTE — Telephone Encounter (Signed)
Please let him know that his labs look good, except his thyroid is a little low- please find out if he's been taking it daily or if he's missed a few doses. Everything else looks good

## 2019-01-23 NOTE — Telephone Encounter (Signed)
PA submitted via cover my meds for testosterone cypionate 200mg /ml. Awaiting approval or denial.

## 2019-01-23 NOTE — Telephone Encounter (Signed)
Patient states that he may have missed a couple of doses.

## 2019-01-25 NOTE — Telephone Encounter (Addendum)
P.A. denied per [facility] fax states:  Per your health plan's criteria, this drug is covered if you meet the following: 1. One of the following: 1. You have two pretreatment serum total testosterone levels less than 300 ng/dL (less than 10.4 nmol/L) or less than the normal range for the lab.  2. Both of the following 1. You have a condition that may cause a change in a protein that binds to sex hormone (sex-hormone binding globulin) (examples of conditions include: thyroid disorder, human immunodeficiency virus disease, a liver disorder, diabetes, obesity.) 2. You have one pre-treatment testosterone (calcuated free or bioavailable) level less than 5 ng/dL (less than 0.17 nmol/L) or less than the normal range for the lab 3. You have a history of one of the following: 1. Surgery to remove both testes 2. A condition where you pituitary gland stops making all of the hormones it is supposed to make for your body 3. A genetic disorder known to cause low testosterone.     To appeal must send written comments and documents to:  [facility] [employer] Fax # [phone]    Fax scanned into system.

## 2019-01-31 ENCOUNTER — Telehealth: Payer: Self-pay

## 2019-01-31 NOTE — Telephone Encounter (Signed)
PA sent for Testosterone injection.  Key: [id number]

## 2019-02-02 ENCOUNTER — Encounter: Payer: Self-pay | Admitting: Family Medicine

## 2019-02-03 ENCOUNTER — Encounter: Payer: Self-pay | Admitting: Family Medicine

## 2019-02-03 NOTE — Telephone Encounter (Signed)
Letter written. Please make sure it printed and fax- please also include 2 most recent testosterone lab values. Thanks!

## 2019-02-04 MED ORDER — TESTOSTERONE CYPIONATE 200 MG/ML IM SOLN: INTRAMUSCULAR | 5 refills | Status: DC

## 2019-02-04 NOTE — Telephone Encounter (Signed)
Prior authorization for Testosterone was denied on 01/23/2019. Must do an appeal or call [phone]. Routing to provider to advise.

## 2019-02-04 NOTE — Telephone Encounter (Signed)
P.A. approved until 02/05/20. Patient is aware.

## 2019-02-04 NOTE — Telephone Encounter (Signed)
Letter and labs printed and faxed to [place] for appeals.

## 2019-02-04 NOTE — Addendum Note (Signed)
Addended by: Valerie Roys on: 02/04/2019 06:11 PM   Modules accepted: Orders

## 2019-02-04 NOTE — Telephone Encounter (Signed)
Letter has been written- see other telephone encounter. Should be printed on back printer.

## 2019-02-04 NOTE — Telephone Encounter (Signed)
P.A. was approved, but they will not pay for a 3 month supply at a time. Will need new RX for 1 month supplies.

## 2019-02-04 NOTE — Telephone Encounter (Signed)
Appeal letter was faxed to patient's insurance this morning and patient was sent a mychart message letting him know.

## 2019-02-25 ENCOUNTER — Other Ambulatory Visit: Payer: Self-pay | Admitting: Family Medicine

## 2019-02-26 ENCOUNTER — Other Ambulatory Visit: Payer: Managed Care, Other (non HMO)

## 2019-02-26 DIAGNOSIS — E039 Hypothyroidism, unspecified: Secondary | ICD-10-CM

## 2019-02-27 LAB — TSH: TSH: 3.86 u[IU]/mL (ref 0.450–4.500)

## 2019-03-01 ENCOUNTER — Other Ambulatory Visit: Payer: Self-pay | Admitting: Family Medicine

## 2019-03-01 MED ORDER — LEVOTHYROXINE SODIUM 200 MCG PO TABS: ORAL_TABLET | ORAL | 3 refills | Status: DC

## 2019-04-22 ENCOUNTER — Other Ambulatory Visit: Payer: Self-pay

## 2019-04-22 ENCOUNTER — Ambulatory Visit (INDEPENDENT_AMBULATORY_CARE_PROVIDER_SITE_OTHER): Payer: Managed Care, Other (non HMO) | Admitting: Family Medicine

## 2019-04-22 ENCOUNTER — Encounter: Payer: Self-pay | Admitting: Family Medicine

## 2019-04-22 VITALS — BP 136/75 | HR 71 | Ht 72.0 in | Wt 215.0 lb

## 2019-04-22 DIAGNOSIS — E0840 Diabetes mellitus due to underlying condition with diabetic neuropathy, unspecified: Secondary | ICD-10-CM | POA: Diagnosis not present

## 2019-04-22 NOTE — Assessment & Plan Note (Signed)
Feeling well. Will check A1c and adjust medicine as needed. Call with any concerns.

## 2019-04-22 NOTE — Progress Notes (Signed)
BP 136/75 (BP Location: Left Arm, Patient Position: Sitting, Cuff Size: Normal)   Pulse 71   Ht 6' (1.829 m)   Wt 215 lb (97.5 kg)   BMI 29.16 kg/m    Subjective:    Patient ID: Manuel Burnett, male    DOB: 11/30/1956, 63 y.o.   MRN: [id number]  HPI: Manuel Burnett is a 63 y.o. male  Chief Complaint  Patient presents with  . Diabetes    Patient states no complaints.   DIABETES Hypoglycemic episodes:no Polydipsia/polyuria: yes- with water pill Visual disturbance: no Chest pain: no Paresthesias: yes Glucose Monitoring: no  Accucheck frequency: Not Checking Taking Insulin?: no Blood Pressure Monitoring: not checking Retinal Examination: Up to Date Foot Exam: Up to Date Diabetic Education: Completed Pneumovax: Up to Date Influenza: Up to Date Aspirin: yes   Relevant past medical, surgical, family and social history reviewed and updated as indicated. Interim medical history since our last visit reviewed. Allergies and medications reviewed and updated.  Review of Systems  Constitutional: Negative.   Respiratory: Negative.   Cardiovascular: Negative.   Genitourinary: Positive for frequency. Negative for decreased urine volume, difficulty urinating, discharge, dysuria, enuresis, flank pain, genital sores, hematuria, penile pain, penile swelling, scrotal swelling, testicular pain and urgency.  Neurological: Negative.   Psychiatric/Behavioral: Negative.     Per HPI unless specifically indicated above     Objective:    BP 136/75 (BP Location: Left Arm, Patient Position: Sitting, Cuff Size: Normal)   Pulse 71   Ht 6' (1.829 m)   Wt 215 lb (97.5 kg)   BMI 29.16 kg/m   Wt Readings from Last 3 Encounters:  04/22/19 215 lb (97.5 kg)  01/21/19 229 lb 1.6 oz (103.9 kg)  10/15/18 229 lb 6.4 oz (104.1 kg)    Physical Exam Vitals signs and nursing note reviewed.  Constitutional:      General: He is not in acute distress.    Appearance: Normal appearance. He is  not ill-appearing, toxic-appearing or diaphoretic.  HENT:     Head: Normocephalic and atraumatic.     Right Ear: External ear normal.     Left Ear: External ear normal.     Nose: Nose normal.     Mouth/Throat:     Mouth: Mucous membranes are moist.     Pharynx: Oropharynx is clear.  Eyes:     General: No scleral icterus.       Right eye: No discharge.        Left eye: No discharge.     Conjunctiva/sclera: Conjunctivae normal.     Pupils: Pupils are equal, round, and reactive to light.  Neck:     Musculoskeletal: Normal range of motion.  Pulmonary:     Effort: Pulmonary effort is normal. No respiratory distress.     Comments: Speaking in full sentences Musculoskeletal: Normal range of motion.  Skin:    Coloration: Skin is not jaundiced or pale.     Findings: No bruising, erythema, lesion or rash.  Neurological:     Mental Status: He is alert and oriented to person, place, and time. Mental status is at baseline.  Psychiatric:        Mood and Affect: Mood normal.        Behavior: Behavior normal.        Thought Content: Thought content normal.        Judgment: Judgment normal.     Results for orders placed or performed in visit on 02/26/19  TSH  Result Value Ref Range   TSH 3.860 0.450 - 4.500 uIU/mL      Assessment & Plan:   Problem List Items Addressed This Visit      Endocrine   Diabetes mellitus due to underlying condition with diabetic neuropathy ([place]) - Primary    Feeling well. Will check A1c and adjust medicine as needed. Call with any concerns.       Relevant Medications   LOW-DOSE ASPIRIN PO   Other Relevant Orders   Bayer DCA Hb A1c Waived       Follow up plan: Return in about 3 months (around 07/23/2019) for Physical.   . This visit was completed via [employer] due to the restrictions of the COVID-19 pandemic. All issues as above were discussed and addressed. Physical exam was done as above through visual confirmation on [employer]. If it was felt that  the patient should be evaluated in the office, they were directed there. The patient verbally consented to this visit. . Location of the patient: home . Location of the provider: home . Those involved with this call:  . Provider: Park Liter, DO . CMA: Merilyn Baba, CMA . Front Desk/Registration: Don Perking  . Time spent on call: 15 minutes with patient face to face via video conference. More than 50% of this time was spent in counseling and coordination of care. 23 minutes total spent in review of patient's record and preparation of their chart.

## 2019-04-25 ENCOUNTER — Other Ambulatory Visit: Payer: Managed Care, Other (non HMO)

## 2019-04-26 ENCOUNTER — Other Ambulatory Visit: Payer: Managed Care, Other (non HMO)

## 2019-04-26 ENCOUNTER — Other Ambulatory Visit: Payer: Self-pay

## 2019-04-26 DIAGNOSIS — E0840 Diabetes mellitus due to underlying condition with diabetic neuropathy, unspecified: Secondary | ICD-10-CM

## 2019-04-26 LAB — BAYER DCA HB A1C WAIVED: HB A1C (BAYER DCA - WAIVED): 8.1 % — ABNORMAL HIGH (ref <7.0)

## 2019-06-06 ENCOUNTER — Encounter: Payer: Self-pay | Admitting: Family Medicine

## 2019-06-12 ENCOUNTER — Telehealth: Payer: Self-pay

## 2019-06-12 NOTE — Telephone Encounter (Signed)
Prior Authorization initiated via CoverMyMeds for Trulicity Key: [id number]    Numbers for future PA's for Rx drug coverage: BIN: [id number] MemberID: [id number] RxGroup:[facility]

## 2019-06-12 NOTE — Telephone Encounter (Signed)
PA N/A Reasoning: "This medication or product was previously approved on [id number] from 2019-01-21 to 2020-01-22. You will be able to fill a prescription for this medication at your pharmacy."

## 2019-07-22 ENCOUNTER — Other Ambulatory Visit: Payer: Self-pay | Admitting: Family Medicine

## 2019-08-08 ENCOUNTER — Other Ambulatory Visit: Payer: Self-pay

## 2019-08-08 ENCOUNTER — Encounter: Payer: Self-pay | Admitting: Family Medicine

## 2019-08-08 ENCOUNTER — Telehealth: Payer: Self-pay | Admitting: Family Medicine

## 2019-08-08 ENCOUNTER — Ambulatory Visit (INDEPENDENT_AMBULATORY_CARE_PROVIDER_SITE_OTHER): Payer: Managed Care, Other (non HMO) | Admitting: Family Medicine

## 2019-08-08 VITALS — BP 144/73 | HR 67 | Ht 73.98 in | Wt 228.5 lb

## 2019-08-08 DIAGNOSIS — E039 Hypothyroidism, unspecified: Secondary | ICD-10-CM | POA: Diagnosis not present

## 2019-08-08 DIAGNOSIS — Z0000 Encounter for general adult medical examination without abnormal findings: Secondary | ICD-10-CM

## 2019-08-08 DIAGNOSIS — N2581 Secondary hyperparathyroidism of renal origin: Secondary | ICD-10-CM

## 2019-08-08 DIAGNOSIS — Z23 Encounter for immunization: Secondary | ICD-10-CM

## 2019-08-08 DIAGNOSIS — E291 Testicular hypofunction: Secondary | ICD-10-CM | POA: Diagnosis not present

## 2019-08-08 DIAGNOSIS — E0840 Diabetes mellitus due to underlying condition with diabetic neuropathy, unspecified: Secondary | ICD-10-CM | POA: Diagnosis not present

## 2019-08-08 DIAGNOSIS — E782 Mixed hyperlipidemia: Secondary | ICD-10-CM

## 2019-08-08 DIAGNOSIS — B354 Tinea corporis: Secondary | ICD-10-CM

## 2019-08-08 DIAGNOSIS — N182 Chronic kidney disease, stage 2 (mild): Secondary | ICD-10-CM

## 2019-08-08 DIAGNOSIS — I129 Hypertensive chronic kidney disease with stage 1 through stage 4 chronic kidney disease, or unspecified chronic kidney disease: Secondary | ICD-10-CM

## 2019-08-08 LAB — MICROSCOPIC EXAMINATION
Bacteria, UA: NONE SEEN
RBC, Urine: NONE SEEN /hpf (ref 0–2)

## 2019-08-08 LAB — UA/M W/RFLX CULTURE, ROUTINE
Bilirubin, UA: NEGATIVE
Leukocytes,UA: NEGATIVE
Nitrite, UA: NEGATIVE
RBC, UA: NEGATIVE
Specific Gravity, UA: 1.025 (ref 1.005–1.030)
Urobilinogen, Ur: 0.2 mg/dL (ref 0.2–1.0)
pH, UA: 5.5 (ref 5.0–7.5)

## 2019-08-08 LAB — MICROALBUMIN, URINE WAIVED
Creatinine, Urine Waived: 200 mg/dL (ref 10–300)
Microalb, Ur Waived: 150 mg/L — ABNORMAL HIGH (ref 0–19)

## 2019-08-08 LAB — BAYER DCA HB A1C WAIVED: HB A1C (BAYER DCA - WAIVED): 7.6 % — ABNORMAL HIGH (ref <7.0)

## 2019-08-08 MED ORDER — GLUCOSE BLOOD VI STRP: ORAL_STRIP | 12 refills | Status: DC

## 2019-08-08 MED ORDER — "BD INTEGRA SYRINGE 22G X 1-1/2"" 3 ML MISC": 1 refills | Status: AC

## 2019-08-08 MED ORDER — ROSUVASTATIN CALCIUM 10 MG PO TABS: 10.0000 mg | ORAL_TABLET | Freq: Every day | ORAL | 1 refills | Status: DC

## 2019-08-08 MED ORDER — ALBUTEROL SULFATE HFA 108 (90 BASE) MCG/ACT IN AERS: 2.0000 | INHALATION_SPRAY | RESPIRATORY_TRACT | 1 refills | Status: DC | PRN

## 2019-08-08 MED ORDER — JANUMET XR 50-1000 MG PO TB24: 2.0000 | ORAL_TABLET | Freq: Every day | ORAL | 1 refills | Status: DC

## 2019-08-08 MED ORDER — TRULICITY 1.5 MG/0.5ML ~~LOC~~ SOAJ: 1.5000 mg | SUBCUTANEOUS | 3 refills | Status: DC

## 2019-08-08 MED ORDER — AMLODIPINE BESYLATE 10 MG PO TABS: ORAL_TABLET | ORAL | 1 refills | Status: DC

## 2019-08-08 MED ORDER — LOSARTAN POTASSIUM 50 MG PO TABS: 50.0000 mg | ORAL_TABLET | Freq: Every day | ORAL | 1 refills | Status: DC

## 2019-08-08 MED ORDER — FENOFIBRATE 160 MG PO TABS: ORAL_TABLET | ORAL | 1 refills | Status: DC

## 2019-08-08 MED ORDER — CLOTRIMAZOLE-BETAMETHASONE 1-0.05 % EX CREA: 1.0000 "application " | TOPICAL_CREAM | Freq: Two times a day (BID) | CUTANEOUS | 0 refills | Status: DC

## 2019-08-08 MED ORDER — HYDROCHLOROTHIAZIDE 25 MG PO TABS: ORAL_TABLET | ORAL | 1 refills | Status: DC

## 2019-08-08 MED ORDER — "NEEDLE (DISP) 18G X 1"" MISC": 1 refills | Status: DC

## 2019-08-08 NOTE — Assessment & Plan Note (Signed)
Checking labs today. Await results. Call with any concerns.  

## 2019-08-08 NOTE — Telephone Encounter (Signed)
New order written will get signature and fax.

## 2019-08-08 NOTE — Progress Notes (Signed)
BP (!) 144/73   Pulse 67   Ht 6' 1.98" (1.879 m)   Wt 228 lb 8 oz (103.6 kg)   SpO2 98%   BMI 29.36 kg/m    Subjective:    Patient ID: Manuel Burnett, male    DOB: 02-03-1956, 63 y.o.   MRN: [id number]  HPI: Manuel Burnett is a 63 y.o. male presenting on 08/08/2019 for comprehensive medical examination. Current medical complaints include:  HYPERTENSION / HYPERLIPIDEMIA Satisfied with current treatment? yes Duration of hypertension: chronic BP monitoring frequency: not checking BP medication side effects: no Past BP meds:  Duration of hyperlipidemia: chronic Cholesterol medication side effects: no Cholesterol supplements: none Past cholesterol medications: crestor Medication compliance: excellent compliance Aspirin: yes Recent stressors: yes Recurrent headaches: no Visual changes: no Palpitations: no Dyspnea: no Chest pain: no Lower extremity edema: no Dizzy/lightheaded: no  DIABETES Hypoglycemic episodes:no Polydipsia/polyuria: no Visual disturbance: no Chest pain: no Paresthesias: no Glucose Monitoring: yes  Accucheck frequency: Daily  Fasting glucose: unsure Taking Insulin?: no Blood Pressure Monitoring: not checking Retinal Examination: Up to Date Foot Exam: Done today Diabetic Education: Completed Pneumovax: Up to Date Influenza: Get in the fall Aspirin: yes  HYPOTHYROIDISM Thyroid control status:controlled Satisfied with current treatment? yes Medication side effects: no Medication compliance: excellent compliance Recent dose adjustment:no Fatigue: no Cold intolerance: no Heat intolerance: no Weight gain: no Weight loss: no Constipation: no Diarrhea/loose stools: no Palpitations: no Lower extremity edema: no Anxiety/depressed mood: no  LOW TESTOSTERONE- just gave himself his shot a week ago Duration: chronic Status: stable  Satisfied with current treatment:  yes Medication side effects:  no Medication compliance: excellent  compliance Decreased libido: no Fatigue: no Depressed mood: no Muscle weakness: no Erectile dysfunction: no  Interim Problems from his last visit: no  Depression Screen done today and results listed below:  Depression screen [facility] 2/9 08/08/2019 07/09/2018 04/03/2017 04/03/2017 02/02/2016  Decreased Interest 1 0 0 0 1  Down, Depressed, Hopeless 0 0 0 0 1  PHQ - 2 Score 1 0 0 0 2  Altered sleeping - 0 0 - 0  Tired, decreased energy - 0 0 - 1  Change in appetite - 0 0 - 1  Feeling bad or failure about yourself  - 0 0 - 0  Trouble concentrating - 0 0 - 0  Moving slowly or fidgety/restless - 0 0 - 0  Suicidal thoughts - 0 0 - 0  PHQ-9 Score - 0 0 - 4  Difficult doing work/chores - Not difficult at all - - Somewhat difficult    Past Medical History:  Past Medical History:  Diagnosis Date  . Cancer ([place])    SKIN  . Chronic kidney disease   . Diabetes mellitus without complication ([place])   . Erectile dysfunction   . GERD (gastroesophageal reflux disease)   . Hyperlipidemia   . Hypertension   . Hypogonadism male   . Hypothyroidism   . Proteinuria   . Sleep apnea    CPAP  . Thyroid disease     Surgical History:  Past Surgical History:  Procedure Laterality Date  . CATARACT EXTRACTION W/PHACO Right 12/26/2017   Procedure: CATARACT EXTRACTION PHACO AND INTRAOCULAR LENS PLACEMENT (IOC);  Surgeon: Birder Robson, MD;  Location: [facility] ORS;  Service: Ophthalmology;  Laterality: Right;  [place] 00:21.8 AP% 21.1 CDE 2.64 FLUID PACK LOT # [id number]  . CATARACT EXTRACTION W/PHACO Left 01/09/2018   Procedure: CATARACT EXTRACTION PHACO AND INTRAOCULAR LENS PLACEMENT (IOC);  Surgeon: George Ina,  Gwyndolyn Saxon, MD;  Location: [facility] ORS;  Service: Ophthalmology;  Laterality: Left;  [place] 00:28.9 AP% 10.0 CDE 2.90 Fluid pcak lot # [id number]  . CLAVICLE SURGERY     internal fixation  . EYE SURGERY Bilateral 12/2017   Cataract  . HEMORRHOID SURGERY    . TONSILLECTOMY AND ADENOIDECTOMY      Medications:   Current Outpatient Medications on File Prior to Visit  Medication Sig  . levothyroxine (SYNTHROID, LEVOTHROID) 200 MCG tablet TAKE 1 TABLET(200 MCG) BY MOUTH DAILY  . LOW-DOSE ASPIRIN PO Take 81 mg by mouth daily.  . Multiple Vitamin (MULTIVITAMIN WITH MINERALS) TABS tablet Take 1 tablet by mouth daily.  . sildenafil (VIAGRA) 100 MG tablet Take 1 tablet (100 mg total) by mouth daily as needed for erectile dysfunction.  Marland Kitchen testosterone cypionate (DEPOTESTOSTERONE CYPIONATE) 200 MG/ML injection INJECT 0.5 ML INTRAMUSCULARLY EVERY 14 DAYS  . triamcinolone ointment (KENALOG) 0.5 % Apply 1 application topically 2 (two) times daily. (Patient not taking: Reported on 04/22/2019)  . vitamin C (ASCORBIC ACID) 250 MG tablet Take 250 mg by mouth daily.   No current facility-administered medications on file prior to visit.     Allergies:  Allergies  Allergen Reactions  . Codeine Sulfate Rash    Social History:  Social History   Socioeconomic History  . Marital status: Married    Spouse name: Not on file  . Number of children: Not on file  . Years of education: Not on file  . Highest education level: Not on file  Occupational History  . Not on file  Social Needs  . Financial resource strain: Not on file  . Food insecurity    Worry: Not on file    Inability: Not on file  . Transportation needs    Medical: Not on file    Non-medical: Not on file  Tobacco Use  . Smoking status: Current Every Day Smoker    Packs/day: 0.50  . Smokeless tobacco: Never Used  Substance and Sexual Activity  . Alcohol use: No    Alcohol/week: 0.0 standard drinks    Frequency: Never  . Drug use: No  . Sexual activity: Not Currently  Lifestyle  . Physical activity    Days per week: Not on file    Minutes per session: Not on file  . Stress: Not on file  Relationships  . Social Herbalist on phone: Not on file    Gets together: Not on file    Attends religious service: Not on file    Active  member of club or organization: Not on file    Attends meetings of clubs or organizations: Not on file    Relationship status: Not on file  . Intimate partner violence    Fear of current or ex partner: Not on file    Emotionally abused: Not on file    Physically abused: Not on file    Forced sexual activity: Not on file  Other Topics Concern  . Not on file  Social History Narrative  . Not on file   Social History   Tobacco Use  Smoking Status Current Every Day Smoker  . Packs/day: 0.50  Smokeless Tobacco Never Used   Social History   Substance and Sexual Activity  Alcohol Use No  . Alcohol/week: 0.0 standard drinks  . Frequency: Never    Family History:  Family History  Problem Relation Age of Onset  . Hypertension Mother   . Heart disease Father   .  Heart attack Father   . CAD Maternal Grandmother   . Cancer Maternal Grandfather     Past medical history, surgical history, medications, allergies, family history and social history reviewed with patient today and changes made to appropriate areas of the chart.   Review of Systems  Constitutional: Negative.   HENT: Negative.   Eyes: Negative.   Respiratory: Negative.   Cardiovascular: Negative.   Gastrointestinal: Negative.   Genitourinary: Negative.   Skin: Positive for rash. Negative for itching.    All other ROS negative except what is listed above and in the HPI.      Objective:    BP (!) 144/73   Pulse 67   Ht 6' 1.98" (1.879 m)   Wt 228 lb 8 oz (103.6 kg)   SpO2 98%   BMI 29.36 kg/m   Wt Readings from Last 3 Encounters:  08/08/19 228 lb 8 oz (103.6 kg)  04/22/19 215 lb (97.5 kg)  01/21/19 229 lb 1.6 oz (103.9 kg)    Physical Exam Vitals signs and nursing note reviewed.  Constitutional:      General: He is not in acute distress.    Appearance: Normal appearance. He is normal weight. He is not ill-appearing, toxic-appearing or diaphoretic.  HENT:     Head: Normocephalic and atraumatic.      Right Ear: Tympanic membrane, ear canal and external ear normal. There is no impacted cerumen.     Left Ear: Tympanic membrane, ear canal and external ear normal. There is no impacted cerumen.     Nose: Nose normal. No congestion or rhinorrhea.     Mouth/Throat:     Mouth: Mucous membranes are moist.     Pharynx: Oropharynx is clear. No oropharyngeal exudate or posterior oropharyngeal erythema.  Eyes:     General: No scleral icterus.       Right eye: No discharge.        Left eye: No discharge.     Extraocular Movements: Extraocular movements intact.     Conjunctiva/sclera: Conjunctivae normal.     Pupils: Pupils are equal, round, and reactive to light.  Neck:     Musculoskeletal: Normal range of motion and neck supple. No neck rigidity or muscular tenderness.     Vascular: No carotid bruit.  Cardiovascular:     Rate and Rhythm: Normal rate and regular rhythm.     Pulses: Normal pulses.     Heart sounds: No murmur. No friction rub. No gallop.   Pulmonary:     Effort: Pulmonary effort is normal. No respiratory distress.     Breath sounds: Normal breath sounds. No stridor. No wheezing, rhonchi or rales.  Chest:     Chest wall: No tenderness.  Abdominal:     General: Abdomen is flat. Bowel sounds are normal. There is no distension.     Palpations: Abdomen is soft. There is no mass.     Tenderness: There is no abdominal tenderness. There is no right CVA tenderness, left CVA tenderness, guarding or rebound.     Hernia: No hernia is present.  Genitourinary:    Comments: Genital exam deferred with shared decision making Musculoskeletal:        General: No swelling, tenderness, deformity or signs of injury.     Right lower leg: No edema.     Left lower leg: No edema.  Lymphadenopathy:     Cervical: No cervical adenopathy.  Skin:    General: Skin is warm and dry.  Capillary Refill: Capillary refill takes less than 2 seconds.     Coloration: Skin is not jaundiced or pale.      Findings: No bruising, erythema, lesion or rash.     Comments: Erythematous papules in circles with central clearing across body  Neurological:     General: No focal deficit present.     Mental Status: He is alert and oriented to person, place, and time.     Cranial Nerves: No cranial nerve deficit.     Sensory: No sensory deficit.     Motor: No weakness.     Coordination: Coordination normal.     Gait: Gait normal.     Deep Tendon Reflexes: Reflexes normal.  Psychiatric:        Mood and Affect: Mood normal.        Behavior: Behavior normal.        Thought Content: Thought content normal.        Judgment: Judgment normal.     Results for orders placed or performed in visit on 04/26/19  Bayer DCA Hb A1c Waived  Result Value Ref Range   HB A1C (BAYER DCA - WAIVED) 8.1 (H) <7.0 %      Assessment & Plan:   Problem List Items Addressed This Visit      Endocrine   Diabetes mellitus due to underlying condition with diabetic neuropathy ([place])    Improved with A1c of 7.6 down from 8.1- continue diet and exercise. Call with any concerns. Continue to monitor. Refills given today.       Relevant Medications   Dulaglutide (TRULICITY) 1.5 LK/4.4WN SOPN   losartan (COZAAR) 50 MG tablet   rosuvastatin (CRESTOR) 10 MG tablet   SitaGLIPtin-MetFORMIN HCl (JANUMET XR) 50-1000 MG TB24   Other Relevant Orders   Bayer DCA Hb A1c Waived   CBC with Differential/Platelet   Comprehensive metabolic panel   Microalbumin, Urine Waived   UA/M w/rflx Culture, Routine   Male hypogonadism    Feeling a little low. Rechecking levels today. Treat as needed. Last took dose 1 week ago. Call with any concerns.       Relevant Orders   CBC with Differential/Platelet   Comprehensive metabolic panel   PSA   UA/M w/rflx Culture, Routine   Testosterone, free, total([facility])   Hypothyroidism    Rechecking levels today. Await results. We will treat as needed. Call with any concerns.       Relevant  Orders   CBC with Differential/Platelet   Comprehensive metabolic panel   TSH   UA/M w/rflx Culture, Routine   Secondary hyperparathyroidism ([place])    Checking labs today. Await results. Call with any concerns.       Relevant Orders   CBC with Differential/Platelet   Comprehensive metabolic panel   UA/M w/rflx Culture, Routine   PTH, Intact and Calcium     Genitourinary   Benign hypertensive renal disease    Under good control on current regimen. Continue current regimen. Continue to monitor. Call with any concerns. Refills given. Labs drawn today.       Relevant Orders   CBC with Differential/Platelet   Comprehensive metabolic panel   Microalbumin, Urine Waived   UA/M w/rflx Culture, Routine   Chronic kidney disease, stage II (mild)    Under good control on current regimen. Continue current regimen. Continue to monitor. Call with any concerns. Refills given. Labs drawn today.      Relevant Orders   CBC with Differential/Platelet   Comprehensive metabolic  panel   UA/M w/rflx Culture, Routine     Other   Hyperlipemia    Under good control on current regimen. Continue current regimen. Continue to monitor. Call with any concerns. Refills given. Labs drawn today.      Relevant Medications   amLODipine (NORVASC) 10 MG tablet   fenofibrate 160 MG tablet   hydrochlorothiazide (HYDRODIURIL) 25 MG tablet   losartan (COZAAR) 50 MG tablet   rosuvastatin (CRESTOR) 10 MG tablet   Other Relevant Orders   CBC with Differential/Platelet   Comprehensive metabolic panel   Lipid Panel w/o Chol/HDL Ratio   UA/M w/rflx Culture, Routine    Other Visit Diagnoses    Routine general medical examination at a health care facility    -  Primary   Vaccines up to date. Screening labs checked today. Continue diet and exercise. Call with any concerns. Colonoscopy up to date. Continue to monitor.    Tinea corporis       Will treat with lotrisone. Call if not getting better or getting worse.     Relevant Medications   clotrimazole-betamethasone (LOTRISONE) cream       Discussed aspirin prophylaxis for myocardial infarction prevention and decision was made to continue ASA  LABORATORY TESTING:  Health maintenance labs ordered today as discussed above.   The natural history of prostate cancer and ongoing controversy regarding screening and potential treatment outcomes of prostate cancer has been discussed with the patient. The meaning of a false positive PSA and a false negative PSA has been discussed. He indicates understanding of the limitations of this screening test and wishes to proceed with screening PSA testing.   IMMUNIZATIONS:   - Tdap: Tetanus vaccination status reviewed: last tetanus booster within 10 years. - Influenza: Administered today - Pneumovax: Up to date - Prevnar: Not applicable - HPV: Not applicable - Zostavax vaccine: Given elsewhere  SCREENING: - Colonoscopy: Up to date  Discussed with patient purpose of the colonoscopy is to detect colon cancer at curable precancerous or early stages   PATIENT COUNSELING:    Sexuality: Discussed sexually transmitted diseases, partner selection, use of condoms, avoidance of unintended pregnancy  and contraceptive alternatives.   Advised to avoid cigarette smoking.  I discussed with the patient that most people either abstain from alcohol or drink within safe limits (<=14/week and <=4 drinks/occasion for males, <=7/weeks and <= 3 drinks/occasion for females) and that the risk for alcohol disorders and other health effects rises proportionally with the number of drinks per week and how often a drinker exceeds daily limits.  Discussed cessation/primary prevention of drug use and availability of treatment for abuse.   Diet: Encouraged to adjust caloric intake to maintain  or achieve ideal body weight, to reduce intake of dietary saturated fat and total fat, to limit sodium intake by avoiding high sodium foods and not  adding table salt, and to maintain adequate dietary potassium and calcium preferably from fresh fruits, vegetables, and low-fat dairy products.    stressed the importance of regular exercise  Injury prevention: Discussed safety belts, safety helmets, smoke detector, smoking near bedding or upholstery.   Dental health: Discussed importance of regular tooth brushing, flossing, and dental visits.   Follow up plan: NEXT PREVENTATIVE PHYSICAL DUE IN 1 YEAR. Return in about 3 months (around 11/08/2019).

## 2019-08-08 NOTE — Assessment & Plan Note (Signed)
Improved with A1c of 7.6 down from 8.1- continue diet and exercise. Call with any concerns. Continue to monitor. Refills given today.

## 2019-08-08 NOTE — Patient Instructions (Signed)

## 2019-08-08 NOTE — Assessment & Plan Note (Signed)
Under good control on current regimen. Continue current regimen. Continue to monitor. Call with any concerns. Refills given. Labs drawn today.   

## 2019-08-08 NOTE — Assessment & Plan Note (Signed)
Rechecking levels today. Await results. We will treat as needed. Call with any concerns.

## 2019-08-08 NOTE — Telephone Encounter (Signed)
Katlyn calling from the pharmacy call and stated that they would need an updated RX for test strips. She states they need to know how many times a day the pt needs to be tested.

## 2019-08-08 NOTE — Assessment & Plan Note (Signed)
Feeling a little low. Rechecking levels today. Treat as needed. Last took dose 1 week ago. Call with any concerns.

## 2019-08-10 LAB — LIPID PANEL W/O CHOL/HDL RATIO
Cholesterol, Total: 177 mg/dL (ref 100–199)
HDL: 38 mg/dL — ABNORMAL LOW (ref >39)
LDL Calculated: 74 mg/dL (ref 0–99)
Triglycerides: 324 mg/dL — ABNORMAL HIGH (ref 0–149)
VLDL Cholesterol Cal: 65 mg/dL — ABNORMAL HIGH (ref 5–40)

## 2019-08-10 LAB — CBC WITH DIFFERENTIAL/PLATELET
Basophils Absolute: 0.1 10*3/uL (ref 0.0–0.2)
Basos: 1 %
EOS (ABSOLUTE): 0.3 10*3/uL (ref 0.0–0.4)
Eos: 4 %
Hematocrit: 43.6 % (ref 37.5–51.0)
Hemoglobin: 14.8 g/dL (ref 13.0–17.7)
Immature Grans (Abs): 0 10*3/uL (ref 0.0–0.1)
Immature Granulocytes: 0 %
Lymphocytes Absolute: 1.4 10*3/uL (ref 0.7–3.1)
Lymphs: 16 %
MCH: 29 pg (ref 26.6–33.0)
MCHC: 33.9 g/dL (ref 31.5–35.7)
MCV: 85 fL (ref 79–97)
Monocytes Absolute: 0.8 10*3/uL (ref 0.1–0.9)
Monocytes: 9 %
Neutrophils Absolute: 6.1 10*3/uL (ref 1.4–7.0)
Neutrophils: 70 %
Platelets: 176 10*3/uL (ref 150–450)
RBC: 5.11 x10E6/uL (ref 4.14–5.80)
RDW: 12.7 % (ref 11.6–15.4)
WBC: 8.7 10*3/uL (ref 3.4–10.8)

## 2019-08-10 LAB — COMPREHENSIVE METABOLIC PANEL
ALT: 12 IU/L (ref 0–44)
AST: 16 IU/L (ref 0–40)
Albumin/Globulin Ratio: 1.8 (ref 1.2–2.2)
Albumin: 4.3 g/dL (ref 3.8–4.8)
Alkaline Phosphatase: 47 IU/L (ref 39–117)
BUN/Creatinine Ratio: 15 (ref 10–24)
BUN: 18 mg/dL (ref 8–27)
Bilirubin Total: 0.4 mg/dL (ref 0.0–1.2)
CO2: 26 mmol/L (ref 20–29)
Calcium: 9.2 mg/dL (ref 8.6–10.2)
Chloride: 100 mmol/L (ref 96–106)
Creatinine, Ser: 1.22 mg/dL (ref 0.76–1.27)
GFR calc Af Amer: 74 mL/min/{1.73_m2} (ref >59)
GFR calc non Af Amer: 64 mL/min/{1.73_m2} (ref >59)
Globulin, Total: 2.4 g/dL (ref 1.5–4.5)
Glucose: 176 mg/dL — ABNORMAL HIGH (ref 65–99)
Potassium: 4 mmol/L (ref 3.5–5.2)
Sodium: 140 mmol/L (ref 134–144)
Total Protein: 6.7 g/dL (ref 6.0–8.5)

## 2019-08-10 LAB — PSA: Prostate Specific Ag, Serum: 1.8 ng/mL (ref 0.0–4.0)

## 2019-08-10 LAB — TESTOSTERONE, FREE, TOTAL, SHBG
Sex Hormone Binding: 27.9 nmol/L (ref 19.3–76.4)
Testosterone, Free: 6.7 pg/mL (ref 6.6–18.1)
Testosterone: 388 ng/dL (ref 264–916)

## 2019-08-10 LAB — PTH, INTACT AND CALCIUM: PTH: 26 pg/mL (ref 15–65)

## 2019-08-10 LAB — TSH: TSH: 7.39 u[IU]/mL — ABNORMAL HIGH (ref 0.450–4.500)

## 2019-08-14 ENCOUNTER — Telehealth: Payer: Self-pay | Admitting: Family Medicine

## 2019-08-14 DIAGNOSIS — E039 Hypothyroidism, unspecified: Secondary | ICD-10-CM

## 2019-08-14 MED ORDER — TESTOSTERONE CYPIONATE 200 MG/ML IM SOLN: INTRAMUSCULAR | 5 refills | Status: DC

## 2019-08-14 MED ORDER — LEVOTHYROXINE SODIUM 25 MCG PO TABS: 25.0000 ug | ORAL_TABLET | Freq: Every day | ORAL | 3 refills | Status: DC

## 2019-08-14 NOTE — Telephone Encounter (Signed)
Ref Range & Units 6d ago 82mo ago 97yr ago  Testosterone 264 - 916 ng/dL 388  280 CM  186Low  CM   Comment: Adult male reference interval is based on a population of  healthy nonobese males (BMI <30) between 73 and 61 years old.  [place], [place] [phone]. PMID: [id number].   Testosterone, Free 6.6 - 18.1 pg/mL 6.7  5.7Low   3.7Low    Sex Hormone Binding 19.3 - 76.4 nmol/L 27.9

## 2019-08-14 NOTE — Telephone Encounter (Signed)
So does he continue injections or not

## 2019-08-14 NOTE — Telephone Encounter (Signed)
Yes, he can continue his medication at the same dose. Rx should be at the pharmacy already

## 2019-08-14 NOTE — Telephone Encounter (Signed)
Patient notified

## 2019-08-14 NOTE — Telephone Encounter (Signed)
Patient notified.  Patient would like his testosterone levels

## 2019-08-14 NOTE — Telephone Encounter (Signed)
Please let him know that his labs look good, but his thyroid is a little under treated. I'm going to increase his medicine to 262mcg and I want him to come back in 6 weeks for a recheck. Everything looks great. Thanks!

## 2019-08-14 NOTE — Telephone Encounter (Signed)
They were normal and should be on his mychart

## 2019-08-16 ENCOUNTER — Other Ambulatory Visit: Payer: Self-pay | Admitting: Family Medicine

## 2019-08-16 NOTE — Telephone Encounter (Signed)
Forwarding medication refill request to PCP for review. 

## 2019-08-24 ENCOUNTER — Other Ambulatory Visit: Payer: Self-pay | Admitting: Family Medicine

## 2019-11-05 ENCOUNTER — Other Ambulatory Visit: Payer: Self-pay | Admitting: Family Medicine

## 2019-11-05 NOTE — Telephone Encounter (Signed)
Requested Prescriptions  Pending Prescriptions Disp Refills  . TRULICITY 1.5 0000000 SOPN [Pharmacy Med Name: TRULICITY 1.5MG /0.5ML SDP 0.5ML] 6 mL     Sig: ADMINISTER 0.5 ML UNDER THE SKIN 1 TIME A WEEK     Endocrinology:  Diabetes - GLP-1 Receptor Agonists Passed - 11/05/2019  2:53 PM      Passed - HBA1C is between 0 and 7.9 and within 180 days    Hemoglobin A1C  Date Value Ref Range Status  07/09/2018 6.9  Final   HB A1C (BAYER DCA - WAIVED)  Date Value Ref Range Status  08/08/2019 7.6 (H) <7.0 % Final    Comment:                                          Diabetic Adult            <7.0                                       Healthy Adult        4.3 - 5.7                                                           (DCCT/NGSP) American Diabetes Association's Summary of Glycemic Recommendations for Adults with Diabetes: Hemoglobin A1c <7.0%. More stringent glycemic goals (A1c <6.0%) may further reduce complications at the cost of increased risk of hypoglycemia.          Passed - Valid encounter within last 6 months    Recent Outpatient Visits          2 months ago Routine general medical examination at a health care facility   [facility], [place] P, DO   6 months ago Diabetes mellitus due to underlying condition with diabetic neuropathy, without long-term current use of insulin ([place])   [facility], Megan P, DO   9 months ago Diabetes mellitus due to underlying condition with diabetic neuropathy, without long-term current use of insulin ([place])   [place], Megan P, DO   1 year ago Diabetes mellitus due to underlying condition with diabetic neuropathy, without long-term current use of insulin ([place])   [place], Megan P, DO   1 year ago Routine general medical examination at a health care facility   [place], Barb Merino, DO      Future Appointments            In 6 days  Wynetta Emery, Barb Merino, [place], [facility]

## 2019-11-11 ENCOUNTER — Ambulatory Visit: Payer: Managed Care, Other (non HMO) | Admitting: Family Medicine

## 2019-12-10 ENCOUNTER — Encounter: Payer: Self-pay | Admitting: Family Medicine

## 2019-12-10 ENCOUNTER — Ambulatory Visit (INDEPENDENT_AMBULATORY_CARE_PROVIDER_SITE_OTHER): Payer: Managed Care, Other (non HMO) | Admitting: Family Medicine

## 2019-12-10 ENCOUNTER — Other Ambulatory Visit: Payer: Managed Care, Other (non HMO)

## 2019-12-10 ENCOUNTER — Other Ambulatory Visit: Payer: Self-pay

## 2019-12-10 VITALS — BP 130/88 | HR 55

## 2019-12-10 DIAGNOSIS — E0840 Diabetes mellitus due to underlying condition with diabetic neuropathy, unspecified: Secondary | ICD-10-CM

## 2019-12-10 LAB — BAYER DCA HB A1C WAIVED: HB A1C (BAYER DCA - WAIVED): 8.4 % — ABNORMAL HIGH (ref <7.0)

## 2019-12-10 MED ORDER — TRULICITY 3 MG/0.5ML ~~LOC~~ SOAJ: 3.0000 mg | SUBCUTANEOUS | 3 refills | Status: DC

## 2019-12-10 NOTE — Addendum Note (Signed)
Addended by: Valerie Roys on: 12/10/2019 01:55 PM   Modules accepted: Orders

## 2019-12-10 NOTE — Progress Notes (Signed)
BP 130/88   Pulse (!) 55    Subjective:    Patient ID: Manuel Burnett, male    DOB: 04-09-1956, 63 y.o.   MRN: [id number]  HPI: Manuel Burnett is a 63 y.o. male  Chief Complaint  Patient presents with  . Diabetes   DIABETES- running a little high, has been cheating a bit Hypoglycemic episodes:no Polydipsia/polyuria: no Visual disturbance: no Chest pain: no Paresthesias: no Glucose Monitoring: no  Accucheck frequency: Not Checking Taking Insulin?: no Blood Pressure Monitoring: not checking Retinal Examination: Not up to Date Foot Exam: Up to Date Diabetic Education: Completed Pneumovax: Up to Date Influenza: Up to Date Aspirin: yes  Relevant past medical, surgical, family and social history reviewed and updated as indicated. Interim medical history since our last visit reviewed. Allergies and medications reviewed and updated.  Review of Systems  Constitutional: Negative.   Respiratory: Negative.   Cardiovascular: Negative.   Musculoskeletal: Negative.   Neurological: Negative.   Psychiatric/Behavioral: Negative.     Per HPI unless specifically indicated above     Objective:    BP 130/88   Pulse (!) 55   Wt Readings from Last 3 Encounters:  08/08/19 228 lb 8 oz (103.6 kg)  04/22/19 215 lb (97.5 kg)  01/21/19 229 lb 1.6 oz (103.9 kg)    Physical Exam Vitals and nursing note reviewed.  Constitutional:      General: He is not in acute distress.    Appearance: Normal appearance. He is not ill-appearing, toxic-appearing or diaphoretic.  HENT:     Head: Normocephalic and atraumatic.     Right Ear: External ear normal.     Left Ear: External ear normal.     Nose: Nose normal.     Mouth/Throat:     Mouth: Mucous membranes are moist.     Pharynx: Oropharynx is clear.  Eyes:     General: No scleral icterus.       Right eye: No discharge.        Left eye: No discharge.     Conjunctiva/sclera: Conjunctivae normal.     Pupils: Pupils are equal, round,  and reactive to light.  Pulmonary:     Effort: Pulmonary effort is normal. No respiratory distress.     Comments: Speaking in full sentences Musculoskeletal:        General: Normal range of motion.     Cervical back: Normal range of motion.  Skin:    Coloration: Skin is not jaundiced or pale.     Findings: No bruising, erythema, lesion or rash.  Neurological:     Mental Status: He is alert and oriented to person, place, and time. Mental status is at baseline.  Psychiatric:        Mood and Affect: Mood normal.        Behavior: Behavior normal.        Thought Content: Thought content normal.        Judgment: Judgment normal.     Results for orders placed or performed in visit on 08/08/19  Microscopic Examination   BLD  Result Value Ref Range   WBC, UA 0-5 0 - 5 /hpf   RBC None seen 0 - 2 /hpf   Epithelial Cells (non renal) 0-10 0 - 10 /hpf   Mucus, UA Present Not Estab.   Bacteria, UA None seen None seen/Few  Bayer DCA Hb A1c Waived  Result Value Ref Range   HB A1C (BAYER DCA - WAIVED) 7.6 (H) <  7.0 %  CBC with Differential/Platelet  Result Value Ref Range   WBC 8.7 3.4 - 10.8 x10E3/uL   RBC 5.11 4.14 - 5.80 x10E6/uL   Hemoglobin 14.8 13.0 - 17.7 g/dL   Hematocrit 43.6 37.5 - 51.0 %   MCV 85 79 - 97 fL   MCH 29.0 26.6 - 33.0 pg   MCHC 33.9 31.5 - 35.7 g/dL   RDW 12.7 11.6 - 15.4 %   Platelets 176 150 - 450 x10E3/uL   Neutrophils 70 Not Estab. %   Lymphs 16 Not Estab. %   Monocytes 9 Not Estab. %   Eos 4 Not Estab. %   Basos 1 Not Estab. %   Neutrophils Absolute 6.1 1.4 - 7.0 x10E3/uL   Lymphocytes Absolute 1.4 0.7 - 3.1 x10E3/uL   Monocytes Absolute 0.8 0.1 - 0.9 x10E3/uL   EOS (ABSOLUTE) 0.3 0.0 - 0.4 x10E3/uL   Basophils Absolute 0.1 0.0 - 0.2 x10E3/uL   Immature Granulocytes 0 Not Estab. %   Immature Grans (Abs) 0.0 0.0 - 0.1 x10E3/uL  Comprehensive metabolic panel  Result Value Ref Range   Glucose 176 (H) 65 - 99 mg/dL   BUN 18 8 - 27 mg/dL   Creatinine,  Ser 1.22 0.76 - 1.27 mg/dL   GFR calc non Af Amer 64 >59 mL/min/1.73   GFR calc Af Amer 74 >59 mL/min/1.73   BUN/Creatinine Ratio 15 10 - 24   Sodium 140 134 - 144 mmol/L   Potassium 4.0 3.5 - 5.2 mmol/L   Chloride 100 96 - 106 mmol/L   CO2 26 20 - 29 mmol/L   Calcium 9.2 8.6 - 10.2 mg/dL   Total Protein 6.7 6.0 - 8.5 g/dL   Albumin 4.3 3.8 - 4.8 g/dL   Globulin, Total 2.4 1.5 - 4.5 g/dL   Albumin/Globulin Ratio 1.8 1.2 - 2.2   Bilirubin Total 0.4 0.0 - 1.2 mg/dL   Alkaline Phosphatase 47 39 - 117 IU/L   AST 16 0 - 40 IU/L   ALT 12 0 - 44 IU/L  Lipid Panel w/o Chol/HDL Ratio  Result Value Ref Range   Cholesterol, Total 177 100 - 199 mg/dL   Triglycerides 324 (H) 0 - 149 mg/dL   HDL 38 (L) >39 mg/dL   VLDL Cholesterol Cal 65 (H) 5 - 40 mg/dL   LDL Calculated 74 0 - 99 mg/dL  Microalbumin, Urine Waived  Result Value Ref Range   Microalb, Ur Waived 150 (H) 0 - 19 mg/L   Creatinine, Urine Waived 200 10 - 300 mg/dL   Microalb/Creat Ratio 30-300 (H) <30 mg/g  PSA  Result Value Ref Range   Prostate Specific Ag, Serum 1.8 0.0 - 4.0 ng/mL  TSH  Result Value Ref Range   TSH 7.390 (H) 0.450 - 4.500 uIU/mL  UA/M w/rflx Culture, Routine   Specimen: Blood   BLD  Result Value Ref Range   Specific Gravity, UA 1.025 1.005 - 1.030   pH, UA 5.5 5.0 - 7.5   Color, UA Yellow Yellow   Appearance Ur Clear Clear   Leukocytes,UA Negative Negative   Protein,UA 2+ (A) Negative/Trace   Glucose, UA 3+ (A) Negative   Ketones, UA Trace (A) Negative   RBC, UA Negative Negative   Bilirubin, UA Negative Negative   Urobilinogen, Ur 0.2 0.2 - 1.0 mg/dL   Nitrite, UA Negative Negative   Microscopic Examination See below:   Testosterone, free, total([facility])  Result Value Ref Range   Testosterone 388 264 -  916 ng/dL   Testosterone, Free 6.7 6.6 - 18.1 pg/mL   Sex Hormone Binding 27.9 19.3 - 76.4 nmol/L  PTH, Intact and Calcium  Result Value Ref Range   PTH 26 15 - 65 pg/mL   PTH Interp  Comment       Assessment & Plan:   Problem List Items Addressed This Visit      Endocrine   Diabetes mellitus due to underlying condition with diabetic neuropathy ([place]) - Primary    Has been cheating a bit on his diet. Will recheck A1c this afternoon and adjust medicine as needed. Call with any concerns. Await results.       Relevant Orders   Bayer DCA Hb A1c Waived       Follow up plan: Return in about 3 months (around 03/09/2020).   . This visit was completed via [employer] due to the restrictions of the COVID-19 pandemic. All issues as above were discussed and addressed. Physical exam was done as above through visual confirmation on Doximity. If it was felt that the patient should be evaluated in the office, they were directed there. The patient verbally consented to this visit. . Location of the patient: home . Location of the provider: work . Those involved with this call:  . Provider: Park Liter, DO . CMA: Tiffany Reel, CMA . Front Desk/Registration: Don Perking  . Time spent on call: 15 minutes with patient face to face via video conference. More than 50% of this time was spent in counseling and coordination of care. 23 minutes total spent in review of patient's record and preparation of their chart.

## 2019-12-10 NOTE — Assessment & Plan Note (Signed)
Has been cheating a bit on his diet. Will recheck A1c this afternoon and adjust medicine as needed. Call with any concerns. Await results.

## 2019-12-22 ENCOUNTER — Other Ambulatory Visit: Payer: Self-pay | Admitting: Family Medicine

## 2019-12-25 ENCOUNTER — Telehealth: Payer: Self-pay

## 2019-12-25 NOTE — Telephone Encounter (Signed)
PA for Trulicity initiated and submitted via Cover My Meds. Key: [id number]

## 2019-12-25 NOTE — Telephone Encounter (Signed)
PA approved.

## 2019-12-26 ENCOUNTER — Other Ambulatory Visit: Payer: Self-pay

## 2019-12-26 NOTE — Telephone Encounter (Signed)
[facility] faxed a Rx refill request on trulicity Inj [id number]

## 2020-02-08 ENCOUNTER — Other Ambulatory Visit: Payer: Self-pay | Admitting: Family Medicine

## 2020-02-09 ENCOUNTER — Other Ambulatory Visit: Payer: Self-pay | Admitting: Family Medicine

## 2020-02-16 ENCOUNTER — Other Ambulatory Visit: Payer: Self-pay | Admitting: Family Medicine

## 2020-02-16 NOTE — Telephone Encounter (Signed)
Requested medication (s) are due for refill today: yes  Requested medication (s) are on the active medication list: yes  Last refill: 01/17/2020  Future visit scheduled: no  Notes to clinic:  medication not assigned to protocol, review manually   Requested Prescriptions  Pending Prescriptions Disp Refills   testosterone cypionate (DEPOTESTOSTERONE CYPIONATE) 200 MG/ML injection [Pharmacy Med Name: TESTOSTERONE CYP 200MG /ML SDV 1ML] 1 mL     Sig: ADMINISTER 0.5 ML IN THE MUSCLE EVERY 14 DAYS      Off-Protocol Failed - 02/16/2020  2:48 PM      Failed - Medication not assigned to a protocol, review manually.      Passed - Valid encounter within last 12 months    Recent Outpatient Visits           2 months ago Diabetes mellitus due to underlying condition with diabetic neuropathy, without long-term current use of insulin ([place])   [place], Megan P, DO   6 months ago Routine general medical examination at a health care facility   [facility], [place] P, DO   10 months ago Diabetes mellitus due to underlying condition with diabetic neuropathy, without long-term current use of insulin [facility])   [place], Megan P, DO   1 year ago Diabetes mellitus due to underlying condition with diabetic neuropathy, without long-term current use of insulin ([place])   [place], Megan P, DO   1 year ago Diabetes mellitus due to underlying condition with diabetic neuropathy, without long-term current use of insulin [facility])   [place], [place], DO       Future Appointments             In 3 weeks Wynetta Emery, Barb Merino, DO [employer]

## 2020-02-17 NOTE — Telephone Encounter (Signed)
Routing to provider  

## 2020-02-17 NOTE — Telephone Encounter (Signed)
Please find out if he has enough to get to his appointment at the end of the month.

## 2020-02-17 NOTE — Telephone Encounter (Signed)
Patient stated that he does not have enough to get to his appt. He got the last shot yesterday.

## 2020-02-21 ENCOUNTER — Telehealth: Payer: Self-pay

## 2020-02-21 NOTE — Telephone Encounter (Signed)
Prior Authorization initiated via CoverMyMeds for Testosterone Cypionate 200MG /ML intramuscular solution Key: [id number]

## 2020-02-21 NOTE — Telephone Encounter (Signed)
PA Approved

## 2020-03-09 ENCOUNTER — Ambulatory Visit (INDEPENDENT_AMBULATORY_CARE_PROVIDER_SITE_OTHER): Payer: Managed Care, Other (non HMO) | Admitting: Family Medicine

## 2020-03-09 ENCOUNTER — Other Ambulatory Visit: Payer: Self-pay

## 2020-03-09 ENCOUNTER — Encounter: Payer: Self-pay | Admitting: Family Medicine

## 2020-03-09 VITALS — BP 135/77 | HR 71 | Temp 98.3°F | Ht 73.9 in | Wt 215.0 lb

## 2020-03-09 DIAGNOSIS — Z1211 Encounter for screening for malignant neoplasm of colon: Secondary | ICD-10-CM

## 2020-03-09 DIAGNOSIS — E0840 Diabetes mellitus due to underlying condition with diabetic neuropathy, unspecified: Secondary | ICD-10-CM | POA: Diagnosis not present

## 2020-03-09 DIAGNOSIS — E039 Hypothyroidism, unspecified: Secondary | ICD-10-CM | POA: Diagnosis not present

## 2020-03-09 LAB — BAYER DCA HB A1C WAIVED: HB A1C (BAYER DCA - WAIVED): 7.1 % — ABNORMAL HIGH (ref <7.0)

## 2020-03-09 MED ORDER — TRIAMCINOLONE ACETONIDE 0.5 % EX OINT: 1.0000 "application " | TOPICAL_OINTMENT | Freq: Two times a day (BID) | CUTANEOUS | 3 refills | Status: DC

## 2020-03-09 NOTE — Progress Notes (Signed)
BP 135/77   Pulse 71   Temp 98.3 F (36.8 C) (Oral)   Ht 6' 1.9" (1.877 m)   Wt 215 lb (97.5 kg)   SpO2 96%   BMI 27.68 kg/m    Subjective:    Patient ID: Manuel Burnett, male    DOB: 04-02-1956, 64 y.o.   MRN: [id number]  HPI: Manuel Burnett is a 64 y.o. male  Chief Complaint  Patient presents with  . Diabetes   DIABETES Hypoglycemic episodes:no Polydipsia/polyuria: no Visual disturbance: no Chest pain: no Paresthesias: no Glucose Monitoring: yes  Accucheck frequency: Daily  Fasting glucose: 143 Taking Insulin?: no Blood Pressure Monitoring: not checking Retinal Examination: Not up to Date Foot Exam: Up to Date Diabetic Education: Not Completed Pneumovax: Up to Date Influenza: Up to Date Aspirin: yes  Relevant past medical, surgical, family and social history reviewed and updated as indicated. Interim medical history since our last visit reviewed. Allergies and medications reviewed and updated.  Review of Systems  Constitutional: Negative.   Respiratory: Negative.   Cardiovascular: Negative.   Gastrointestinal: Negative.   Musculoskeletal: Negative.   Psychiatric/Behavioral: Negative.     Per HPI unless specifically indicated above     Objective:    BP 135/77   Pulse 71   Temp 98.3 F (36.8 C) (Oral)   Ht 6' 1.9" (1.877 m)   Wt 215 lb (97.5 kg)   SpO2 96%   BMI 27.68 kg/m   Wt Readings from Last 3 Encounters:  03/09/20 215 lb (97.5 kg)  08/08/19 228 lb 8 oz (103.6 kg)  04/22/19 215 lb (97.5 kg)    Physical Exam Vitals and nursing note reviewed.  Constitutional:      General: He is not in acute distress.    Appearance: Normal appearance. He is not ill-appearing, toxic-appearing or diaphoretic.  HENT:     Head: Normocephalic and atraumatic.     Right Ear: External ear normal.     Left Ear: External ear normal.     Nose: Nose normal.     Mouth/Throat:     Mouth: Mucous membranes are moist.     Pharynx: Oropharynx is clear.  Eyes:       General: No scleral icterus.       Right eye: No discharge.        Left eye: No discharge.     Extraocular Movements: Extraocular movements intact.     Conjunctiva/sclera: Conjunctivae normal.     Pupils: Pupils are equal, round, and reactive to light.  Cardiovascular:     Rate and Rhythm: Normal rate and regular rhythm.     Pulses: Normal pulses.     Heart sounds: Normal heart sounds. No murmur. No friction rub. No gallop.   Pulmonary:     Effort: Pulmonary effort is normal. No respiratory distress.     Breath sounds: Normal breath sounds. No stridor. No wheezing, rhonchi or rales.  Chest:     Chest wall: No tenderness.  Musculoskeletal:        General: Normal range of motion.     Cervical back: Normal range of motion and neck supple.  Skin:    General: Skin is warm and dry.     Capillary Refill: Capillary refill takes less than 2 seconds.     Coloration: Skin is not jaundiced or pale.     Findings: No bruising, erythema, lesion or rash.  Neurological:     General: No focal deficit present.  Mental Status: He is alert and oriented to person, place, and time. Mental status is at baseline.  Psychiatric:        Mood and Affect: Mood normal.        Behavior: Behavior normal.        Thought Content: Thought content normal.        Judgment: Judgment normal.     Results for orders placed or performed in visit on 12/10/19  Bayer DCA Hb A1c Waived  Result Value Ref Range   HB A1C (BAYER DCA - WAIVED) 8.4 (H) <7.0 %      Assessment & Plan:   Problem List Items Addressed This Visit      Endocrine   Diabetes mellitus due to underlying condition with diabetic neuropathy ([place]) - Primary    Doing much better with A1c of 7.1 down from 8.4. Continue current regimen. Continue to monitor. Recheck 3 months. Call with any concerns.       Relevant Orders   Bayer DCA Hb A1c Waived   Hypothyroidism    Due for recheck. Labs drawn today.       Other Visit Diagnoses    Colon  cancer screening       Referral to GI made today   Relevant Orders   Ambulatory referral to Gastroenterology       Follow up plan: Return in about 3 months (around 06/09/2020).

## 2020-03-09 NOTE — Assessment & Plan Note (Signed)
Doing much better with A1c of 7.1 down from 8.4. Continue current regimen. Continue to monitor. Recheck 3 months. Call with any concerns.

## 2020-03-09 NOTE — Assessment & Plan Note (Signed)
Due for recheck. Labs drawn today.

## 2020-03-10 ENCOUNTER — Other Ambulatory Visit: Payer: Self-pay | Admitting: Family Medicine

## 2020-03-10 DIAGNOSIS — E039 Hypothyroidism, unspecified: Secondary | ICD-10-CM

## 2020-03-10 LAB — TSH: TSH: 0.078 u[IU]/mL — ABNORMAL LOW (ref 0.450–4.500)

## 2020-03-16 ENCOUNTER — Ambulatory Visit: Payer: Self-pay

## 2020-03-16 ENCOUNTER — Ambulatory Visit: Payer: Self-pay | Admitting: Gastroenterology

## 2020-03-16 ENCOUNTER — Other Ambulatory Visit: Payer: Self-pay

## 2020-03-16 VITALS — Ht 73.0 in | Wt 215.0 lb

## 2020-03-16 DIAGNOSIS — Z1211 Encounter for screening for malignant neoplasm of colon: Secondary | ICD-10-CM

## 2020-03-16 NOTE — Progress Notes (Signed)
Gastroenterology Pre-Procedure Review  Request Date: 04/06/20 Requesting Physician: Dr. Marius Ditch  PATIENT REVIEW QUESTIONS: The patient responded to the following health history questions as indicated:    1. Are you having any GI issues? no 2. Do you have a personal history of Polyps? yes (5 years ago) 3. Do you have a family history of Colon Cancer or Polyps? no 4. Diabetes Mellitus? yes (type 2) 5. Joint replacements in the past 12 months?no 6. Major health problems in the past 3 months?no 7. Any artificial heart valves, MVP, or defibrillator?no    MEDICATIONS & ALLERGIES:    Patient reports the following regarding taking any anticoagulation/antiplatelet therapy:   Plavix, Coumadin, Eliquis, Xarelto, Lovenox, Pradaxa, Brilinta, or Effient? no Aspirin? no  Patient confirms/reports the following medications:  Current Outpatient Medications  Medication Sig Dispense Refill  . albuterol (PROAIR HFA) 108 (90 Base) MCG/ACT inhaler Inhale 2 puffs into the lungs every 4 (four) hours as needed for wheezing or shortness of breath. 8.5 g 1  . amLODipine (NORVASC) 10 MG tablet TAKE 1 TABLET(10 MG) BY MOUTH DAILY 90 tablet 1  . B-D INTEGRA SYRINGE 22G X 1-1/2" 3 ML MISC Inject testosterone 1x every 2 weeks 100 each 1  . clotrimazole-betamethasone (LOTRISONE) cream Apply 1 application topically 2 (two) times daily. 30 g 0  . fenofibrate 160 MG tablet TAKE 1 TABLET BY MOUTH EVERY DAY WITH FOOD 90 tablet 1  . glucose blood test strip Use as instructed 100 each 12  . hydrochlorothiazide (HYDRODIURIL) 25 MG tablet TAKE 1 TABLET(25 MG) BY MOUTH DAILY 90 tablet 1  . JANUMET XR 50-1000 MG TB24 TAKE 2 TABLETS BY MOUTH DAILY 180 tablet 1  . levothyroxine (SYNTHROID, LEVOTHROID) 200 MCG tablet TAKE 1 TABLET(200 MCG) BY MOUTH DAILY 90 tablet 3  . losartan (COZAAR) 50 MG tablet TAKE 1 TABLET(50 MG) BY MOUTH DAILY 90 tablet 1  . LOW-DOSE ASPIRIN PO Take 81 mg by mouth daily.    . Multiple Vitamin (MULTIVITAMIN  WITH MINERALS) TABS tablet Take 1 tablet by mouth daily.    Marland Kitchen NEEDLE, DISP, 18 G (YALE DISP NEEDLES 18GX1") 18G X 1" MISC Inject testosterone every 2 weeks as directed 100 each 1  . rosuvastatin (CRESTOR) 10 MG tablet Take 1 tablet (10 mg total) by mouth daily. 90 tablet 1  . sildenafil (VIAGRA) 100 MG tablet Take 1 tablet (100 mg total) by mouth daily as needed for erectile dysfunction. 10 tablet 12  . testosterone cypionate (DEPOTESTOSTERONE CYPIONATE) 200 MG/ML injection ADMINISTER 0.5 ML IN THE MUSCLE EVERY 14 DAYS 1 mL 0  . triamcinolone ointment (KENALOG) 0.5 % Apply 1 application topically 2 (two) times daily. 90 g 3  . TRULICITY 3 0000000 SOPN INJECT 3 MG INTO SKIN ONCE A WEEK 6 mL 3  . vitamin C (ASCORBIC ACID) 250 MG tablet Take 250 mg by mouth daily.     No current facility-administered medications for this visit.    Patient confirms/reports the following allergies:  Allergies  Allergen Reactions  . Codeine Sulfate Rash    No orders of the defined types were placed in this encounter.   AUTHORIZATION INFORMATION Primary Insurance: 1D#: Group #:  Secondary Insurance: 1D#: Group #:  SCHEDULE INFORMATION: Date: 04/06/20 Time: Location:[facility]

## 2020-03-31 ENCOUNTER — Other Ambulatory Visit: Payer: Self-pay | Admitting: Family Medicine

## 2020-04-01 NOTE — Telephone Encounter (Signed)
LOV 03/09/20 °

## 2020-04-02 ENCOUNTER — Other Ambulatory Visit
Admission: RE | Admit: 2020-04-02 | Discharge: 2020-04-02 | Disposition: A | Discharge status: Home / Self Care | Payer: Managed Care, Other (non HMO) | Source: Ambulatory Visit | Attending: Gastroenterology | Admitting: Gastroenterology

## 2020-04-02 ENCOUNTER — Other Ambulatory Visit: Payer: Self-pay

## 2020-04-02 DIAGNOSIS — Z20822 Contact with and (suspected) exposure to covid-19: Secondary | ICD-10-CM | POA: Diagnosis not present

## 2020-04-02 DIAGNOSIS — Z01812 Encounter for preprocedural laboratory examination: Secondary | ICD-10-CM | POA: Diagnosis not present

## 2020-04-02 LAB — SARS CORONAVIRUS 2 (TAT 6-24 HRS): SARS Coronavirus 2: NEGATIVE

## 2020-04-03 ENCOUNTER — Encounter: Payer: Self-pay | Admitting: Gastroenterology

## 2020-04-06 ENCOUNTER — Ambulatory Visit: Payer: Managed Care, Other (non HMO) | Admitting: Anesthesiology

## 2020-04-06 ENCOUNTER — Other Ambulatory Visit: Payer: Self-pay

## 2020-04-06 ENCOUNTER — Encounter
Admission: RE | Disposition: A | Discharge status: Home / Self Care | Payer: Self-pay | Source: Home / Self Care | Attending: Gastroenterology

## 2020-04-06 ENCOUNTER — Ambulatory Visit
Admission: RE | Admit: 2020-04-06 | Discharge: 2020-04-06 | Disposition: A | Discharge status: Home / Self Care | Payer: Managed Care, Other (non HMO) | Attending: Gastroenterology | Admitting: Gastroenterology

## 2020-04-06 ENCOUNTER — Encounter: Payer: Self-pay | Admitting: Gastroenterology

## 2020-04-06 DIAGNOSIS — Z9842 Cataract extraction status, left eye: Secondary | ICD-10-CM | POA: Insufficient documentation

## 2020-04-06 DIAGNOSIS — G4730 Sleep apnea, unspecified: Secondary | ICD-10-CM | POA: Insufficient documentation

## 2020-04-06 DIAGNOSIS — Z79890 Hormone replacement therapy (postmenopausal): Secondary | ICD-10-CM | POA: Insufficient documentation

## 2020-04-06 DIAGNOSIS — Z9841 Cataract extraction status, right eye: Secondary | ICD-10-CM | POA: Insufficient documentation

## 2020-04-06 DIAGNOSIS — E1122 Type 2 diabetes mellitus with diabetic chronic kidney disease: Secondary | ICD-10-CM | POA: Diagnosis not present

## 2020-04-06 DIAGNOSIS — N189 Chronic kidney disease, unspecified: Secondary | ICD-10-CM | POA: Insufficient documentation

## 2020-04-06 DIAGNOSIS — Z7984 Long term (current) use of oral hypoglycemic drugs: Secondary | ICD-10-CM | POA: Diagnosis not present

## 2020-04-06 DIAGNOSIS — Z961 Presence of intraocular lens: Secondary | ICD-10-CM | POA: Insufficient documentation

## 2020-04-06 DIAGNOSIS — Z8249 Family history of ischemic heart disease and other diseases of the circulatory system: Secondary | ICD-10-CM | POA: Insufficient documentation

## 2020-04-06 DIAGNOSIS — I509 Heart failure, unspecified: Secondary | ICD-10-CM | POA: Insufficient documentation

## 2020-04-06 DIAGNOSIS — E785 Hyperlipidemia, unspecified: Secondary | ICD-10-CM | POA: Insufficient documentation

## 2020-04-06 DIAGNOSIS — K219 Gastro-esophageal reflux disease without esophagitis: Secondary | ICD-10-CM | POA: Diagnosis not present

## 2020-04-06 DIAGNOSIS — K5730 Diverticulosis of large intestine without perforation or abscess without bleeding: Secondary | ICD-10-CM | POA: Insufficient documentation

## 2020-04-06 DIAGNOSIS — Z79899 Other long term (current) drug therapy: Secondary | ICD-10-CM | POA: Diagnosis not present

## 2020-04-06 DIAGNOSIS — E039 Hypothyroidism, unspecified: Secondary | ICD-10-CM | POA: Insufficient documentation

## 2020-04-06 DIAGNOSIS — F17210 Nicotine dependence, cigarettes, uncomplicated: Secondary | ICD-10-CM | POA: Insufficient documentation

## 2020-04-06 DIAGNOSIS — I130 Hypertensive heart and chronic kidney disease with heart failure and stage 1 through stage 4 chronic kidney disease, or unspecified chronic kidney disease: Secondary | ICD-10-CM | POA: Diagnosis not present

## 2020-04-06 DIAGNOSIS — K644 Residual hemorrhoidal skin tags: Secondary | ICD-10-CM | POA: Diagnosis not present

## 2020-04-06 DIAGNOSIS — Z1211 Encounter for screening for malignant neoplasm of colon: Secondary | ICD-10-CM | POA: Diagnosis present

## 2020-04-06 HISTORY — PX: COLONOSCOPY WITH PROPOFOL: SHX5780

## 2020-04-06 SURGERY — COLONOSCOPY WITH PROPOFOL
Anesthesia: General

## 2020-04-06 MED ORDER — PROPOFOL 500 MG/50ML IV EMUL
INTRAVENOUS | Status: AC
  Filled 2020-04-06: qty 50

## 2020-04-06 MED ORDER — SODIUM CHLORIDE 0.9 % IV SOLN: INTRAVENOUS | Status: DC

## 2020-04-06 MED ORDER — MIDAZOLAM HCL 2 MG/2ML IJ SOLN
INTRAMUSCULAR | Status: AC
  Filled 2020-04-06: qty 2

## 2020-04-06 MED ORDER — FENTANYL CITRATE (PF) 100 MCG/2ML IJ SOLN
INTRAMUSCULAR | Status: DC | PRN
  Administered 2020-04-06: 100 ug via INTRAVENOUS

## 2020-04-06 MED ORDER — PROPOFOL 500 MG/50ML IV EMUL
INTRAVENOUS | Status: DC | PRN
  Administered 2020-04-06: 150 ug/kg/min via INTRAVENOUS

## 2020-04-06 MED ORDER — MIDAZOLAM HCL 2 MG/2ML IJ SOLN
INTRAMUSCULAR | Status: DC | PRN
  Administered 2020-04-06: 2 mg via INTRAVENOUS

## 2020-04-06 MED ORDER — FENTANYL CITRATE (PF) 100 MCG/2ML IJ SOLN
INTRAMUSCULAR | Status: AC
  Filled 2020-04-06: qty 2

## 2020-04-06 NOTE — Anesthesia Postprocedure Evaluation (Signed)
Anesthesia Post Note  Patient: [name]  Procedure(s) Performed: COLONOSCOPY WITH PROPOFOL (N/A )  Patient location during evaluation: Endoscopy Anesthesia Type: General Level of consciousness: awake and alert and oriented Pain management: pain level controlled Vital Signs Assessment: post-procedure vital signs reviewed and stable Respiratory status: spontaneous breathing Cardiovascular status: blood pressure returned to baseline Anesthetic complications: no     Last Vitals:  Vitals:   04/06/20 0843 04/06/20 0945  BP: (!) 162/97 107/68  Pulse: 70   Resp: 18   Temp: (!) 35.9 C (!) 36 C  SpO2: 96%     Last Pain:  Vitals:   04/06/20 0945  TempSrc: Temporal  PainSc:                  Humzah Harty

## 2020-04-06 NOTE — Anesthesia Preprocedure Evaluation (Signed)
Anesthesia Evaluation  Patient identified by MRN, date of birth, ID band Patient awake    Reviewed: Allergy & Precautions, NPO status , Patient's Chart, lab work & pertinent test results  History of Anesthesia Complications Negative for: history of anesthetic complications  Airway Mallampati: II       Dental   Pulmonary sleep apnea and Continuous Positive Airway Pressure Ventilation , neg COPD, Current Smoker and Patient abstained from smoking.,           Cardiovascular hypertension, Pt. on medications +CHF  (-) Past MI (-) dysrhythmias (-) Valvular Problems/Murmurs     Neuro/Psych neg Seizures    GI/Hepatic Neg liver ROS, GERD  Medicated and Controlled,  Endo/Other  diabetes, Type 2, Oral Hypoglycemic AgentsHypothyroidism   Renal/GU Renal InsufficiencyRenal disease     Musculoskeletal   Abdominal   Peds  Hematology   Anesthesia Other Findings   Reproductive/Obstetrics                             Anesthesia Physical  Anesthesia Plan  ASA: III  Anesthesia Plan: General   Post-op Pain Management:    Induction: Intravenous  PONV Risk Score and Plan: Propofol infusion  Airway Management Planned: Nasal Cannula  Additional Equipment:   Intra-op Plan:   Post-operative Plan:   Informed Consent: I have reviewed the patients History and Physical, chart, labs and discussed the procedure including the risks, benefits and alternatives for the proposed anesthesia with the patient or authorized representative who has indicated his/her understanding and acceptance.       Plan Discussed with: CRNA  Anesthesia Plan Comments:         Anesthesia Quick Evaluation

## 2020-04-06 NOTE — Transfer of Care (Signed)
Immediate Anesthesia Transfer of Care Note  Patient: Manuel Burnett  Procedure(s) Performed: COLONOSCOPY WITH PROPOFOL (N/A )  Patient Location: PACU  Anesthesia Type:General  Level of Consciousness: awake and sedated  Airway & Oxygen Therapy: Patient Spontanous Breathing and Patient connected to nasal cannula oxygen  Post-op Assessment: Report given to RN and Post -op Vital signs reviewed and stable  Post vital signs: Reviewed and stable  Last Vitals:  Vitals Value Taken Time  BP 107/68 04/06/20 0945  Temp 36 C 04/06/20 0945  Pulse 63 04/06/20 0946  Resp 22 04/06/20 0946  SpO2 94 % 04/06/20 0946  Vitals shown include unvalidated device data.  Last Pain:  Vitals:   04/06/20 0945  TempSrc: Temporal  PainSc:          Complications: No apparent anesthesia complications

## 2020-04-06 NOTE — H&P (Signed)
Cephas Darby, MD [address]  [place]  [place], [place] [address]  Main: [phone]  Fax: [phone] Pager: [phone]  Primary Care Physician:  Valerie Roys, DO Primary Gastroenterologist:  Dr. Cephas Darby  Pre-Procedure History & Physical: HPI:  Manuel Burnett is a 64 y.o. male is here for an colonoscopy.   Past Medical History:  Diagnosis Date  . Cancer ([place])    SKIN  . Chronic kidney disease   . Diabetes mellitus without complication ([place])   . Erectile dysfunction   . GERD (gastroesophageal reflux disease)   . Hyperlipidemia   . Hypertension   . Hypogonadism male   . Hypothyroidism   . Proteinuria   . Sleep apnea    CPAP  . Thyroid disease     Past Surgical History:  Procedure Laterality Date  . CATARACT EXTRACTION W/PHACO Right 12/26/2017   Procedure: CATARACT EXTRACTION PHACO AND INTRAOCULAR LENS PLACEMENT (IOC);  Surgeon: Birder Robson, MD;  Location: [facility] ORS;  Service: Ophthalmology;  Laterality: Right;  [place] 00:21.8 AP% 21.1 CDE 2.64 FLUID PACK LOT # [id number]  . CATARACT EXTRACTION W/PHACO Left 01/09/2018   Procedure: CATARACT EXTRACTION PHACO AND INTRAOCULAR LENS PLACEMENT (IOC);  Surgeon: Birder Robson, MD;  Location: [facility] ORS;  Service: Ophthalmology;  Laterality: Left;  [place] 00:28.9 AP% 10.0 CDE 2.90 Fluid pcak lot # [id number]  . CLAVICLE SURGERY     internal fixation  . EYE SURGERY Bilateral 12/2017   Cataract  . FRACTURE SURGERY    . HEMORRHOID SURGERY    . TONSILLECTOMY    . TONSILLECTOMY AND ADENOIDECTOMY      Prior to Admission medications   Medication Sig Start Date End Date Taking? Authorizing Provider  amLODipine (NORVASC) 10 MG tablet TAKE 1 TABLET(10 MG) BY MOUTH DAILY 08/08/19  Yes Johnson, Megan P, DO  B-D INTEGRA SYRINGE 22G X 1-1/2" 3 ML MISC Inject testosterone 1x every 2 weeks 08/08/19  Yes Johnson, Megan P, DO  clotrimazole-betamethasone (LOTRISONE) cream Apply 1 application topically 2 (two) times daily.  08/08/19  Yes Johnson, Megan P, DO  fenofibrate 160 MG tablet TAKE 1 TABLET BY MOUTH EVERY DAY WITH FOOD 02/08/20  Yes Wynetta Emery, Megan P, DO  glucose blood test strip Use as instructed 08/08/19  Yes Johnson, Megan P, DO  hydrochlorothiazide (HYDRODIURIL) 25 MG tablet TAKE 1 TABLET(25 MG) BY MOUTH DAILY 08/08/19  Yes Johnson, Megan P, DO  JANUMET XR 50-1000 MG TB24 TAKE 2 TABLETS BY MOUTH DAILY 08/24/19  Yes Johnson, Megan P, DO  levothyroxine (SYNTHROID, LEVOTHROID) 200 MCG tablet TAKE 1 TABLET(200 MCG) BY MOUTH DAILY 03/01/19  Yes Johnson, Megan P, DO  losartan (COZAAR) 50 MG tablet TAKE 1 TABLET(50 MG) BY MOUTH DAILY 02/08/20  Yes Johnson, Megan P, DO  LOW-DOSE ASPIRIN PO Take 81 mg by mouth daily.   Yes [provider]  Multiple Vitamin (MULTIVITAMIN WITH MINERALS) TABS tablet Take 1 tablet by mouth daily.   Yes [provider]  NEEDLE, DISP, 18 G (YALE DISP NEEDLES 18GX1") 18G X 1" MISC Inject testosterone every 2 weeks as directed 08/08/19  Yes Johnson, Megan P, DO  rosuvastatin (CRESTOR) 10 MG tablet Take 1 tablet (10 mg total) by mouth daily. 08/08/19  Yes Johnson, Megan P, DO  testosterone cypionate (DEPOTESTOSTERONE CYPIONATE) 200 MG/ML injection ADMINISTER 0.5 ML IN THE MUSCLE EVERY 14 DAYS 04/01/20  Yes Johnson, Megan P, DO  triamcinolone ointment (KENALOG) 0.5 % Apply 1 application topically 2 (two) times daily. 03/09/20  Yes Johnson, Megan P, DO  TRULICITY 3 0000000 SOPN INJECT 3 MG INTO SKIN ONCE A WEEK 02/09/20  Yes Johnson, Megan P, DO  vitamin C (ASCORBIC ACID) 250 MG tablet Take 250 mg by mouth daily.   Yes [provider]  albuterol (PROAIR HFA) 108 (90 Base) MCG/ACT inhaler Inhale 2 puffs into the lungs every 4 (four) hours as needed for wheezing or shortness of breath. 08/08/19   Wynetta Emery, Megan P, DO  sildenafil (VIAGRA) 100 MG tablet Take 1 tablet (100 mg total) by mouth daily as needed for erectile dysfunction. 01/21/19   Park Liter P, DO    Allergies as of  03/16/2020 - Review Complete 03/16/2020  Allergen Reaction Noted  . Codeine sulfate Rash 08/03/2015    Family History  Problem Relation Age of Onset  . Hypertension Mother   . Heart disease Father   . Heart attack Father   . CAD Maternal Grandmother   . Cancer Maternal Grandfather     Social History   Socioeconomic History  . Marital status: Married    Spouse name: Not on file  . Number of children: Not on file  . Years of education: Not on file  . Highest education level: Not on file  Occupational History  . Not on file  Tobacco Use  . Smoking status: Current Every Day Smoker    Packs/day: 0.50    Types: Cigarettes  . Smokeless tobacco: Never Used  Substance and Sexual Activity  . Alcohol use: Yes    Alcohol/week: 0.0 standard drinks  . Drug use: No  . Sexual activity: Not Currently  Other Topics Concern  . Not on file  Social History Narrative  . Not on file   Social Determinants of Health   Financial Resource Strain:   . Difficulty of Paying Living Expenses:   Food Insecurity:   . Worried About Charity fundraiser in the Last Year:   . Arboriculturist in the Last Year:   Transportation Needs:   . Film/video editor (Medical):   Marland Kitchen Lack of Transportation (Non-Medical):   Physical Activity:   . Days of Exercise per Week:   . Minutes of Exercise per Session:   Stress:   . Feeling of Stress :   Social Connections:   . Frequency of Communication with Friends and Family:   . Frequency of Social Gatherings with Friends and Family:   . Attends Religious Services:   . Active Member of Clubs or Organizations:   . Attends Archivist Meetings:   Marland Kitchen Marital Status:   Intimate Partner Violence:   . Fear of Current or Ex-Partner:   . Emotionally Abused:   Marland Kitchen Physically Abused:   . Sexually Abused:     Review of Systems: See HPI, otherwise negative ROS  Physical Exam: BP (!) 162/97   Pulse 70   Temp (!) 96.7 F (35.9 C) (Temporal)   Resp 18    Ht 6\' 1"  (1.854 m)   Wt 97.7 kg   SpO2 96%   BMI 28.42 kg/m  General:   Alert,  pleasant and cooperative in NAD Head:  Normocephalic and atraumatic. Neck:  Supple; no masses or thyromegaly. Lungs:  Clear throughout to auscultation.    Heart:  Regular rate and rhythm. Abdomen:  Soft, nontender and nondistended. Normal bowel sounds, without guarding, and without rebound.   Neurologic:  Alert and  oriented x4;  grossly normal neurologically.  Impression/Plan: DUSHAUN NISBET is here  for an colonoscopy to be performed for colon cancer screening  Risks, benefits, limitations, and alternatives regarding  colonoscopy have been reviewed with the patient.  Questions have been answered.  All parties agreeable.   Sherri Sear, MD  04/06/2020, 9:08 AM

## 2020-04-06 NOTE — Op Note (Signed)
[facility]astroenter[facility] Patient Name: Manuel Burnett Procedure Date: 04/06/2020 9:21 AM MRN: [id number] Account #: [id number] Date of Birth: 08/28/1956 Admit Type: Outpatient Age: 64 [facility] Gender: Male Note Status: Finalized Procedure:             Colonoscopy Indications:           Screening for colorectal malignant neoplasm, Last                         colonoscopy: February 2016 Providers:             Lin Landsman MD, MD Medicines:             Monitored Anesthesia Care Complications:         No immediate complications. Estimated blood loss: None. Procedure:             Pre-Anesthesia Assessment:                        - Prior to the procedure, a History and Physical was                         performed, and patient medications and allergies were                         reviewed. The patient is competent. The risks and                         benefits of the procedure and the sedation options and                         risks were discussed with the patient. All questions                         were answered and informed consent was obtained.                         Patient identification and proposed procedure were                         verified by the physician, the nurse, the                         anesthesiologist, the anesthetist and the technician                         in the pre-procedure area in the procedure room in the                         endoscopy suite. Mental Status Examination: alert and                         oriented. Airway Examination: normal oropharyngeal                         airway and neck mobility. Respiratory Examination:                         clear to auscultation. CV Examination: normal.  Prophylactic Antibiotics: The patient does not require                         prophylactic antibiotics. Prior Anticoagulants: The                         patient has taken no previous  anticoagulant or                         antiplatelet agents. ASA Grade Assessment: III - A                         patient with severe systemic disease. After reviewing                         the risks and benefits, the patient was deemed in                         satisfactory condition to undergo the procedure. The                         anesthesia plan was to use monitored anesthesia care                         (MAC). Immediately prior to administration of                         medications, the patient was re-assessed for adequacy                         to receive sedatives. The heart rate, respiratory                         rate, oxygen saturations, blood pressure, adequacy of                         pulmonary ventilation, and response to care were                         monitored throughout the procedure. The physical                         status of the patient was re-assessed after the                         procedure.                        After obtaining informed consent, the colonoscope was                         passed under direct vision. Throughout the procedure,                         the patient's blood pressure, pulse, and oxygen                         saturations were monitored continuously. The  Colonoscope was introduced through the anus and                         advanced to the the terminal ileum, with                         identification of the appendiceal orifice and IC                         valve. The colonoscopy was performed with moderate                         difficulty due to multiple diverticula in the colon                         and significant looping. Successful completion of the                         procedure was aided by applying abdominal pressure.                         The patient tolerated the procedure well. The quality                         of the bowel preparation was evaluated using the BBPS                          [facility] Bowel Preparation Scale) with scores of: Right                         Colon = 3, Transverse Colon = 3 and Left Colon = 3                         (entire mucosa seen well with no residual staining,                         small fragments of stool or opaque liquid). The total                         BBPS score equals 9. Findings:      The perianal and digital rectal examinations were normal. Pertinent       negatives include normal sphincter tone and no palpable rectal lesions.      The terminal ileum appeared normal.      Multiple diverticula were found in the entire colon.      Non-bleeding external hemorrhoids were found during retroflexion. The       hemorrhoids were large.      The exam was otherwise without abnormality. Impression:            - The examined portion of the ileum was normal.                        - Diverticulosis in the entire examined colon.                        - Non-bleeding external hemorrhoids.                        -  The examination was otherwise normal.                        - No specimens collected. Recommendation:        - Discharge patient to home (with escort).                        - Resume previous diet today.                        - Continue present medications.                        - Repeat colonoscopy in 10 years for surveillance. Procedure Code(s):     --- Professional ---                        K1840, Colorectal cancer screening; colonoscopy on                         individual not meeting criteria for high risk Diagnosis Code(s):     --- Professional ---                        Z12.11, Encounter for screening for malignant neoplasm                         of colon                        K64.4, Residual hemorrhoidal skin tags                        K57.30, Diverticulosis of large intestine without                         perforation or abscess without bleeding CPT copyright 2019 American Medical Association. All  rights reserved. The codes documented in this report are preliminary and upon coder review may  be revised to meet current compliance requirements. Dr. Ulyess Mort Lin Landsman MD, MD 04/06/2020 9:41:48 AM This report has been signed electronically. Number of Addenda: 0 Note Initiated On: 04/06/2020 9:21 AM Scope Withdrawal Time: 0 hours 8 minutes 46 seconds  Total Procedure Duration: 0 hours 12 minutes 28 seconds  Estimated Blood Loss:  Estimated blood loss: none.      [facility]

## 2020-04-08 ENCOUNTER — Encounter: Payer: Self-pay | Admitting: *Deleted

## 2020-04-13 ENCOUNTER — Other Ambulatory Visit: Payer: Self-pay | Admitting: Family Medicine

## 2020-04-22 ENCOUNTER — Other Ambulatory Visit: Payer: Self-pay

## 2020-04-22 ENCOUNTER — Encounter (HOSPITAL_COMMUNITY): Payer: Self-pay

## 2020-04-22 ENCOUNTER — Ambulatory Visit (HOSPITAL_COMMUNITY)
Admission: EM | Admit: 2020-04-22 | Discharge: 2020-04-22 | Disposition: A | Discharge status: Home / Self Care | Payer: Managed Care, Other (non HMO) | Attending: Family Medicine | Admitting: Family Medicine

## 2020-04-22 DIAGNOSIS — S61012A Laceration without foreign body of left thumb without damage to nail, initial encounter: Secondary | ICD-10-CM

## 2020-04-22 DIAGNOSIS — I10 Essential (primary) hypertension: Secondary | ICD-10-CM | POA: Diagnosis not present

## 2020-04-22 MED ORDER — TETANUS-DIPHTH-ACELL PERTUSSIS 5-2.5-18.5 LF-MCG/0.5 IM SUSP
INTRAMUSCULAR | Status: AC
  Filled 2020-04-22: qty 0.5

## 2020-04-22 MED ORDER — TETANUS-DIPHTH-ACELL PERTUSSIS 5-2.5-18.5 LF-MCG/0.5 IM SUSP
0.5000 mL | Freq: Once | INTRAMUSCULAR | Status: AC
  Administered 2020-04-22: 0.5 mL via INTRAMUSCULAR

## 2020-04-22 NOTE — Discharge Instructions (Signed)
Your blood pressure was noted to be elevated during your visit today. If you are currently taking medication for high blood pressure, please ensure you are taking this as directed. If you do not have a history of high blood pressure and your blood pressure remains persistently elevated, you may need to begin taking a medication at some point. You may return here within the next few days to recheck if unable to see your primary care provider or if do not have a one.  BP (!) 169/89 (BP Location: Right Arm)   Pulse 74   Temp 98.4 F (36.9 C) (Oral)   Resp 18   SpO2 98%

## 2020-04-22 NOTE — ED Triage Notes (Signed)
Pt c/o laceration to left thumb when the pocket knife he was using at work folded back onto thumb. approx 1.5 cm laceration to lateral aspect thumb noted; pressure dsg applied. Pt states last tetanus was less than 10 years ago, not certain of date.

## 2020-04-25 NOTE — ED Provider Notes (Signed)
[place]   [id number] 04/22/20 Arrival Time: [id number]  ASSESSMENT & PLAN:  1. Laceration of left thumb without foreign body without damage to nail, initial encounter   2. Elevated blood pressure reading in office with diagnosis of hypertension     Meds ordered this encounter  Medications  . Tdap (BOOSTRIX) injection 0.5 mL    Procedure: Verbal consent obtained. Patient provided with risks and alternatives to the procedure. Wound copiously irrigated with NS then cleansed with betadine. Local anesthesia: Lidocaine 2% without epinephrine. Wound carefully explored. No foreign body, tendon injury, or nonviable tissue were noted. Using sterile technique, 3 interrupted 5-0 Prolene sutures were placed to reapproximate the wound. Procedure tolerated well. No complications. Minimal bleeding. Advised to look for and return for any signs of infection such as redness, swelling, discharge, or worsening pain. Return for suture removal in 7 days.    Discharge Instructions     Your blood pressure was noted to be elevated during your visit today. If you are currently taking medication for high blood pressure, please ensure you are taking this as directed. If you do not have a history of high blood pressure and your blood pressure remains persistently elevated, you may need to begin taking a medication at some point. You may return here within the next few days to recheck if unable to see your primary care provider or if do not have a one.  BP (!) 169/89 (BP Location: Right Arm)   Pulse 74   Temp 98.4 F (36.9 C) (Oral)   Resp 18   SpO2 98%     Reviewed expectations re: course of current medical issues. Questions answered. Outlined signs and symptoms indicating need for more acute intervention. Patient verbalized understanding. After Visit Summary given.   SUBJECTIVE:  Manuel Burnett is a 64 y.o. male who presents with a laceration o fhis left thumb; distal; today on pocket knife.  Moderate bleeding; controlled. No extremity sensation changes or weakness. Describes FROM.  Td UTD: No.  Increased blood pressure noted today. Reports that he is treated for HTN.  He reports taking medications as instructed, no chest pain on exertion, no dyspnea on exertion, no swelling of ankles, no orthostatic dizziness or lightheadedness, no orthopnea or paroxysmal nocturnal dyspnea and no palpitations.   OBJECTIVE:  Vitals:   04/22/20 1726  BP: (!) 169/89  Pulse: 74  Resp: 18  Temp: 98.4 F (36.9 C)  TempSrc: Oral  SpO2: 98%     General appearance: alert; no distress Skin: linear laceration of distal side of thumb; size: approx 1 cm; clean wound edges, no foreign bodies; with mild bleeding; normal distal sensation and cap refill Psychological: alert and cooperative; normal mood and affect    Labs Reviewed - No data to display  No results found.  Allergies  Allergen Reactions  . Codeine Sulfate Rash    Past Medical History:  Diagnosis Date  . Cancer ([place])    SKIN  . Chronic kidney disease   . Diabetes mellitus without complication ([place])   . Erectile dysfunction   . GERD (gastroesophageal reflux disease)   . Hyperlipidemia   . Hypertension   . Hypogonadism male   . Hypothyroidism   . Proteinuria   . Sleep apnea    CPAP  . Thyroid disease    Social History   Socioeconomic History  . Marital status: Married    Spouse name: Not on file  . Number of children: Not on file  . Years  of education: Not on file  . Highest education level: Not on file  Occupational History  . Not on file  Tobacco Use  . Smoking status: Current Every Day Smoker    Packs/day: 0.50    Types: Cigarettes  . Smokeless tobacco: Never Used  Substance and Sexual Activity  . Alcohol use: Yes    Alcohol/week: 0.0 standard drinks  . Drug use: No  . Sexual activity: Not Currently  Other Topics Concern  . Not on file  Social History Narrative  . Not on file   Social  Determinants of Health   Financial Resource Strain:   . Difficulty of Paying Living Expenses:   Food Insecurity:   . Worried About Charity fundraiser in the Last Year:   . Arboriculturist in the Last Year:   Transportation Needs:   . Film/video editor (Medical):   Marland Kitchen Lack of Transportation (Non-Medical):   Physical Activity:   . Days of Exercise per Week:   . Minutes of Exercise per Session:   Stress:   . Feeling of Stress :   Social Connections:   . Frequency of Communication with Friends and Family:   . Frequency of Social Gatherings with Friends and Family:   . Attends Religious Services:   . Active Member of Clubs or Organizations:   . Attends Archivist Meetings:   Marland Kitchen Marital Status:          Vanessa Kick, MD 04/25/20 [phone]

## 2020-04-29 ENCOUNTER — Encounter (HOSPITAL_COMMUNITY): Payer: Self-pay

## 2020-04-29 ENCOUNTER — Ambulatory Visit (HOSPITAL_COMMUNITY)
Admission: EM | Admit: 2020-04-29 | Discharge: 2020-04-29 | Disposition: A | Discharge status: Home / Self Care | Payer: Managed Care, Other (non HMO)

## 2020-04-29 ENCOUNTER — Other Ambulatory Visit: Payer: Self-pay

## 2020-04-29 NOTE — ED Triage Notes (Signed)
Patient is here for suture removal. Had 3 sutures placed 7 days ago. Removed 3 prolene sutures from left thumb. Patient tolerated well. Applied a bacitracin and bandaid to finger.

## 2020-05-06 ENCOUNTER — Other Ambulatory Visit: Payer: Self-pay | Admitting: Family Medicine

## 2020-05-21 ENCOUNTER — Ambulatory Visit: Payer: Managed Care, Other (non HMO) | Admitting: Family Medicine

## 2020-05-21 ENCOUNTER — Other Ambulatory Visit: Payer: Self-pay

## 2020-05-21 VITALS — BP 163/89 | HR 56 | Temp 97.1°F | Ht 72.0 in | Wt 216.2 lb

## 2020-05-21 DIAGNOSIS — Z024 Encounter for examination for driving license: Secondary | ICD-10-CM

## 2020-05-21 LAB — POCT URINALYSIS DIPSTICK
Bilirubin, UA: NEGATIVE
Blood, UA: NEGATIVE
Glucose, UA: POSITIVE — AB
Ketones, UA: NEGATIVE
Leukocytes, UA: NEGATIVE
Nitrite, UA: NEGATIVE
Protein, UA: NEGATIVE
Spec Grav, UA: <=1.005 — AB (ref 1.010–1.025)
Urobilinogen, UA: 0.2 E.U./dL
pH, UA: 7 (ref 5.0–8.0)

## 2020-05-21 NOTE — Patient Instructions (Addendum)
DOT PE certificate provided x 1 year 

## 2020-05-21 NOTE — Assessment & Plan Note (Addendum)
DOT PE certificate provided x 1 year 

## 2020-05-21 NOTE — Progress Notes (Signed)
Subjective:    Patient ID: Manuel Burnett, male    DOB: [birth year], 64 y.o.   MRN: [id number]  Manuel Burnett is a 64 y.o. 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  Depression screen [facility] 2/9 08/08/2019 07/09/2018 04/03/2017  Decreased Interest 1 0 0  Down, Depressed, Hopeless 0 0 0  PHQ - 2 Score 1 0 0  Altered sleeping - 0 0  Tired, decreased energy - 0 0  Change in appetite - 0 0  Feeling bad or failure about yourself  - 0 0  Trouble concentrating - 0 0  Moving slowly or fidgety/restless - 0 0  Suicidal thoughts - 0 0  PHQ-9 Score - 0 0  Difficult doing work/chores - Not difficult at all -    Social History   Tobacco Use  . Smoking status: Current Every Day Smoker    Packs/day: 0.50    Types: Cigarettes  . Smokeless tobacco: Never Used  Substance Use Topics  . Alcohol use: Yes    Alcohol/week: 0.0 standard drinks  . Drug use: No    Review of Systems Per HPI unless specifically indicated above     Objective:    BP (!) 163/89 (BP Location: Left Arm, Patient Position: Sitting, Cuff Size: Normal)   Pulse (!) 56   Temp (!) 97.1 F (36.2 C) (Temporal)   Ht 6' (1.829 m)   Wt 216 lb 3.2 oz (98.1 kg)   BMI 29.32 kg/m   Wt Readings from Last 3 Encounters:  05/21/20 216 lb 3.2 oz (98.1 kg)  04/29/20 215 lb (97.5 kg)  04/06/20 215 lb 6.9 oz (97.7 kg)    Physical Exam Vitals reviewed.  Constitutional:      General: He is not in acute distress.    Appearance: Normal appearance. He is well-developed, well-groomed and overweight. He is not ill-appearing or toxic-appearing.  HENT:     Head: Normocephalic.     Right Ear: Tympanic membrane, ear canal and external ear normal. There is no impacted cerumen.     Left Ear: Tympanic membrane, ear canal and external ear normal. There is no impacted cerumen.     Nose: Nose normal. No congestion or rhinorrhea.     Mouth/Throat:     Mouth: Mucous  membranes are moist.     Pharynx: Oropharynx is clear. No oropharyngeal exudate or posterior oropharyngeal erythema.  Eyes:     General: Lids are normal. Vision grossly intact. No scleral icterus.       Right eye: No discharge.        Left eye: No discharge.     Extraocular Movements: Extraocular movements intact.     Conjunctiva/sclera: Conjunctivae normal.     Pupils: Pupils are equal, round, and reactive to light.  Cardiovascular:     Rate and Rhythm: Normal rate and regular rhythm.     Pulses: Normal pulses.          Dorsalis pedis pulses are 2+ on the right side and 2+ on the left side.     Heart sounds: Normal heart sounds. No murmur. No friction rub. No gallop.   Pulmonary:     Effort: Pulmonary effort is normal. No respiratory distress.     Breath sounds: Normal breath sounds. No wheezing, rhonchi or rales.  Abdominal:     General: Abdomen is flat. Bowel sounds are normal. There is no distension.  Palpations: Abdomen is soft. There is no hepatomegaly, splenomegaly or mass.     Tenderness: There is no abdominal tenderness. There is no right CVA tenderness, left CVA tenderness, guarding or rebound.     Hernia: No hernia is present.  Musculoskeletal:        General: Normal range of motion.     Cervical back: Normal range of motion and neck supple. No rigidity or tenderness.     Right lower leg: No edema.     Left lower leg: No edema.     Comments: Strength 5/5 BUE & BLE  Feet:     Right foot:     Skin integrity: Skin integrity normal.     Left foot:     Skin integrity: Skin integrity normal.  Lymphadenopathy:     Cervical: No cervical adenopathy.  Skin:    General: Skin is warm and dry.     Capillary Refill: Capillary refill takes less than 2 seconds.  Neurological:     General: No focal deficit present.     Mental Status: He is alert and oriented to person, place, and time.     Cranial Nerves: Cranial nerves are intact. No cranial nerve deficit.     Sensory:  Sensation is intact. No sensory deficit.     Motor: Motor function is intact. No weakness.     Coordination: Coordination is intact. Coordination normal.     Gait: Gait is intact. Gait normal.     Deep Tendon Reflexes: Reflexes are normal and symmetric. Reflexes normal.  Psychiatric:        Attention and Perception: Attention and perception normal.        Mood and Affect: Mood and affect normal.        Speech: Speech normal.        Behavior: Behavior normal. Behavior is cooperative.        Thought Content: Thought content normal.        Cognition and Memory: Cognition and memory normal.        Judgment: Judgment normal.     Results for orders placed or performed in visit on 05/21/20  POCT Urinalysis Dipstick  Result Value Ref Range   Color, UA light yellow    Clarity, UA clear    Glucose, UA Positive (A) Negative   Bilirubin, UA negative    Ketones, UA negative    Spec Grav, UA <=1.005 (A) 1.010 - 1.025   Blood, UA negative    pH, UA 7.0 5.0 - 8.0   Protein, UA Negative Negative   Urobilinogen, UA 0.2 0.2 or 1.0 E.U./dL   Nitrite, UA negative    Leukocytes, UA Negative Negative   Appearance     Odor        Assessment & Plan:   Problem List Items Addressed This Visit      Other   Encounter for commercial driving license (CDL) exam    DOT PE certificate provided 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     Follow up plan: Return in about 1 year (around 05/21/2021) for DOT PE.   Harlin Rain, [place] [facility] Practitioner [facility] 05/21/2020, 4:30 PM

## 2020-05-28 ENCOUNTER — Encounter: Payer: Self-pay | Admitting: Family Medicine

## 2020-05-28 NOTE — Telephone Encounter (Signed)
Called pt to schedule virtual, no answer, left vm  Copied from [place] [phone]. Topic: General - Inquiry >> May 28, 2020 10:48 AM Mathis Bud wrote: Reason for CRM: Patient left PCP mychart message this morning.  Patient states he believes he has Bronchitis.  Patient is requesting a zpac or something else to help him get better before his appt on the 28th so he can come into office. Call back [place], [place]  Phone:  [phone] Fax:  [phone]

## 2020-05-29 ENCOUNTER — Other Ambulatory Visit: Payer: Self-pay

## 2020-05-29 ENCOUNTER — Ambulatory Visit (HOSPITAL_COMMUNITY)
Admission: EM | Admit: 2020-05-29 | Discharge: 2020-05-29 | Disposition: A | Discharge status: Home / Self Care | Payer: Managed Care, Other (non HMO) | Attending: Family Medicine | Admitting: Family Medicine

## 2020-05-29 DIAGNOSIS — I129 Hypertensive chronic kidney disease with stage 1 through stage 4 chronic kidney disease, or unspecified chronic kidney disease: Secondary | ICD-10-CM | POA: Diagnosis not present

## 2020-05-29 DIAGNOSIS — E785 Hyperlipidemia, unspecified: Secondary | ICD-10-CM | POA: Diagnosis not present

## 2020-05-29 DIAGNOSIS — J22 Unspecified acute lower respiratory infection: Secondary | ICD-10-CM

## 2020-05-29 DIAGNOSIS — Z885 Allergy status to narcotic agent status: Secondary | ICD-10-CM | POA: Diagnosis not present

## 2020-05-29 DIAGNOSIS — E039 Hypothyroidism, unspecified: Secondary | ICD-10-CM | POA: Diagnosis not present

## 2020-05-29 DIAGNOSIS — N2581 Secondary hyperparathyroidism of renal origin: Secondary | ICD-10-CM | POA: Diagnosis not present

## 2020-05-29 DIAGNOSIS — R05 Cough: Secondary | ICD-10-CM | POA: Diagnosis present

## 2020-05-29 DIAGNOSIS — Z79890 Hormone replacement therapy (postmenopausal): Secondary | ICD-10-CM | POA: Diagnosis not present

## 2020-05-29 DIAGNOSIS — Z20822 Contact with and (suspected) exposure to covid-19: Secondary | ICD-10-CM | POA: Diagnosis not present

## 2020-05-29 DIAGNOSIS — Z79899 Other long term (current) drug therapy: Secondary | ICD-10-CM | POA: Insufficient documentation

## 2020-05-29 DIAGNOSIS — Z9989 Dependence on other enabling machines and devices: Secondary | ICD-10-CM | POA: Insufficient documentation

## 2020-05-29 DIAGNOSIS — R0602 Shortness of breath: Secondary | ICD-10-CM | POA: Diagnosis not present

## 2020-05-29 DIAGNOSIS — F17210 Nicotine dependence, cigarettes, uncomplicated: Secondary | ICD-10-CM | POA: Insufficient documentation

## 2020-05-29 DIAGNOSIS — Z7982 Long term (current) use of aspirin: Secondary | ICD-10-CM | POA: Diagnosis not present

## 2020-05-29 DIAGNOSIS — N182 Chronic kidney disease, stage 2 (mild): Secondary | ICD-10-CM | POA: Insufficient documentation

## 2020-05-29 DIAGNOSIS — G4733 Obstructive sleep apnea (adult) (pediatric): Secondary | ICD-10-CM | POA: Insufficient documentation

## 2020-05-29 DIAGNOSIS — J439 Emphysema, unspecified: Secondary | ICD-10-CM | POA: Insufficient documentation

## 2020-05-29 DIAGNOSIS — E1136 Type 2 diabetes mellitus with diabetic cataract: Secondary | ICD-10-CM | POA: Diagnosis not present

## 2020-05-29 MED ORDER — AZITHROMYCIN 250 MG PO TABS: ORAL_TABLET | ORAL | 0 refills | Status: DC

## 2020-05-29 NOTE — Discharge Instructions (Addendum)
Drink plenty of fluids Continue Mucinex/guaifenesin for cough Take Z-Pak as directed Use albuterol inhaler as needed Return as needed

## 2020-05-29 NOTE — ED Triage Notes (Signed)
Pt c/o productive cough with green sputum, SOB, wheezing for approx 3 weeks. Pt has been using albuterol and Mucinex with only mild improvement in symptoms temporarily.  Denies fever, n/v/d, or chills. Mildly coarse lung sounds to anterior. Able to speak full sentences w/o difficulty.

## 2020-05-29 NOTE — ED Provider Notes (Signed)
Friona    CSN: [id number] Arrival date & time: 05/29/20  1041      History   Chief Complaint Chief Complaint  Patient presents with  . Cough  . Shortness of Breath    HPI Manuel Burnett is a 64 y.o. male.   HPI  64 year old smoker.  States he does not have any underlying lung disease or emphysema.  There is emphysema noted on his chest CT, however.  States has had an increase in his cough with productive sputum approximately 2 to 3 weeks.  Increased wheezing.  Increase use of albuterol.  No fever or chills.  No fatigue.  He has chest "tiredness" in both lower anterior rib area.  No nausea or vomiting. States that he is compliant with his medical care takes blood pressure, cholesterol, diabetes medications. He called his primary care office and they were unable to see him today. He states that he gets frequent bronchitis that requires antibiotics.  Past Medical History:  Diagnosis Date  . Cancer ([place])    SKIN  . Chronic kidney disease   . Diabetes mellitus without complication ([place])   . Erectile dysfunction   . GERD (gastroesophageal reflux disease)   . Hyperlipidemia   . Hypertension   . Hypogonadism male   . Hypothyroidism   . Proteinuria   . Sleep apnea    CPAP  . Thyroid disease     Patient Active Problem List   Diagnosis Date Noted  . Encounter for commercial driving license (CDL) exam 20/25/4270  . Encounter for screening colonoscopy   . Benign hypertensive renal disease 08/03/2015  . Hyperlipemia 08/03/2015  . Diabetes mellitus due to underlying condition with diabetic neuropathy ([place]) 08/03/2015  . Chronic kidney disease, stage II (mild) 08/03/2015  . Male hypogonadism 08/03/2015  . Hypothyroidism 08/03/2015  . Proteinuria 08/03/2015  . ED (erectile dysfunction) 08/03/2015  . OSA on CPAP 08/03/2015  . Secondary hyperparathyroidism ([place]) 08/03/2015  . Diverticulosis 08/03/2015  . Closed fracture of left scapula 11/09/2014  . Closed  fracture of shaft of left clavicle 11/09/2014  . Multiple fractures of ribs of left side 11/09/2014    Past Surgical History:  Procedure Laterality Date  . CATARACT EXTRACTION W/PHACO Right 12/26/2017   Procedure: CATARACT EXTRACTION PHACO AND INTRAOCULAR LENS PLACEMENT (IOC);  Surgeon: Birder Robson, MD;  Location: [facility] ORS;  Service: Ophthalmology;  Laterality: Right;  [place] 00:21.8 AP% 21.1 CDE 2.64 FLUID PACK LOT # [id number]  . CATARACT EXTRACTION W/PHACO Left 01/09/2018   Procedure: CATARACT EXTRACTION PHACO AND INTRAOCULAR LENS PLACEMENT (IOC);  Surgeon: Birder Robson, MD;  Location: [facility] ORS;  Service: Ophthalmology;  Laterality: Left;  [place] 00:28.9 AP% 10.0 CDE 2.90 Fluid pcak lot # [id number]  . CLAVICLE SURGERY     internal fixation  . COLONOSCOPY WITH PROPOFOL N/A 04/06/2020   Procedure: COLONOSCOPY WITH PROPOFOL;  Surgeon: Lin Landsman, MD;  Location: [facility] END[facility]COPY;  Service: Gastroenterology;  Laterality: N/A;  priority 4  . EYE SURGERY Bilateral 12/2017   Cataract  . FRACTURE SURGERY    . HEMORRHOID SURGERY    . TONSILLECTOMY    . TONSILLECTOMY AND ADENOIDECTOMY         Home Medications    Prior to Admission medications   Medication Sig Start Date End Date Taking? Authorizing Provider  albuterol (PROAIR HFA) 108 (90 Base) MCG/ACT inhaler Inhale 2 puffs into the lungs every 4 (four) hours as needed for wheezing or shortness of breath. 08/08/19  Yes Wynetta Emery,  Megan P, DO  amLODipine (NORVASC) 10 MG tablet TAKE 1 TABLET(10 MG) BY MOUTH DAILY 04/13/20  Yes Johnson, Megan P, DO  fenofibrate 160 MG tablet TAKE 1 TABLET BY MOUTH EVERY DAY WITH FOOD 02/08/20  Yes Johnson, Megan P, DO  hydrochlorothiazide (HYDRODIURIL) 25 MG tablet TAKE 1 TABLET(25 MG) BY MOUTH DAILY 04/13/20  Yes Johnson, Megan P, DO  JANUMET XR 50-1000 MG TB24 TAKE 2 TABLETS BY MOUTH DAILY 08/24/19  Yes Johnson, Megan P, DO  levothyroxine (SYNTHROID) 200 MCG tablet TAKE 1 TABLET(200 MCG) BY MOUTH DAILY  05/06/20  Yes Johnson, Megan P, DO  losartan (COZAAR) 50 MG tablet TAKE 1 TABLET(50 MG) BY MOUTH DAILY 02/08/20  Yes Johnson, Megan P, DO  rosuvastatin (CRESTOR) 10 MG tablet Take 1 tablet (10 mg total) by mouth daily. 08/08/19  Yes Johnson, Megan P, DO  testosterone cypionate (DEPOTESTOSTERONE CYPIONATE) 200 MG/ML injection ADMINISTER 0.5 ML IN THE MUSCLE EVERY 14 DAYS 04/01/20  Yes Johnson, Megan P, DO  azithromycin (ZITHROMAX Z-PAK) 250 MG tablet Take two pills today followed by one a day until gone 05/29/20   Raylene Everts, MD  B-D INTEGRA SYRINGE 22G X 1-1/2" 3 ML MISC Inject testosterone 1x every 2 weeks 08/08/19   Park Liter P, DO  glucose blood test strip Use as instructed 08/08/19   Wynetta Emery, Megan P, DO  LOW-DOSE ASPIRIN PO Take 81 mg by mouth daily.    [provider]  Multiple Vitamin (MULTIVITAMIN WITH MINERALS) TABS tablet Take 1 tablet by mouth daily.    [provider]  NEEDLE, DISP, 18 G (YALE DISP NEEDLES 18GX1") 18G X 1" MISC Inject testosterone every 2 weeks as directed 08/08/19   Park Liter P, DO  sildenafil (VIAGRA) 100 MG tablet Take 1 tablet (100 mg total) by mouth daily as needed for erectile dysfunction. 01/21/19   Johnson, Megan P, DO  triamcinolone ointment (KENALOG) 0.5 % Apply 1 application topically 2 (two) times daily. 03/09/20   Johnson, Megan P, DO  TRULICITY 3 IF/0.2DX SOPN INJECT 3 MG INTO SKIN ONCE A WEEK Patient not taking: Reported on 05/21/2020 02/09/20   Park Liter P, DO  vitamin C (ASCORBIC ACID) 250 MG tablet Take 250 mg by mouth daily.    [provider]    Family History Family History  Problem Relation Age of Onset  . Hypertension Mother   . Heart disease Father   . Heart attack Father   . CAD Maternal Grandmother   . Cancer Maternal Grandfather     Social History Social History   Tobacco Use  . Smoking status: Current Every Day Smoker    Packs/day: 0.50    Types: Cigarettes  . Smokeless tobacco: Never Used    Vaping Use  . Vaping Use: Never used  Substance Use Topics  . Alcohol use: Yes    Alcohol/week: 0.0 standard drinks  . Drug use: No     Allergies   Codeine sulfate   Review of Systems Review of Systems  Constitutional: Negative for chills, fatigue and fever.  HENT: Positive for congestion.   Respiratory: Positive for cough, shortness of breath and wheezing.   Cardiovascular: Negative for chest pain.     Physical Exam Triage Vital Signs ED Triage Vitals  Enc Vitals Group     BP 05/29/20 1148 (!) 152/92     Pulse Rate 05/29/20 1148 72     Resp 05/29/20 1148 20     Temp 05/29/20 1148 98.7 F (37.1 C)  Temp Source 05/29/20 1148 Oral     SpO2 05/29/20 1148 98 %     Weight --      Height --      Head Circumference --      Peak Flow --      Pain Score 05/29/20 1145 0     Pain Loc --      Pain Edu? --      Excl. in [place]? --    No data found.  Updated Vital Signs BP (!) 152/92 (BP Location: Left Arm)   Pulse 72   Temp 98.7 F (37.1 C) (Oral)   Resp 20   SpO2 98%     Physical Exam Constitutional:      General: He is not in acute distress.    Appearance: He is well-developed and normal weight.  HENT:     Head: Normocephalic and atraumatic.     Mouth/Throat:     Mouth: Mucous membranes are moist.  Eyes:     Conjunctiva/sclera: Conjunctivae normal.     Pupils: Pupils are equal, round, and reactive to light.  Cardiovascular:     Rate and Rhythm: Normal rate and regular rhythm.     Heart sounds: Normal heart sounds.  Pulmonary:     Effort: Pulmonary effort is normal. No respiratory distress.     Breath sounds: Normal breath sounds. No decreased breath sounds, wheezing, rhonchi or rales.  Chest:     Chest wall: No tenderness.  Abdominal:     General: Bowel sounds are normal. There is no distension.     Palpations: Abdomen is soft.  Musculoskeletal:        General: Normal range of motion.     Cervical back: Normal range of motion.     Right lower leg: No  edema.     Left lower leg: No edema.  Lymphadenopathy:     Cervical: No cervical adenopathy.  Skin:    General: Skin is warm and dry.  Neurological:     Mental Status: He is alert.  Psychiatric:        Mood and Affect: Mood normal.        Behavior: Behavior normal.      UC Treatments / Results  Labs (all labs ordered are listed, but only abnormal results are displayed) Labs Reviewed  SARS CORONAVIRUS 2 (TAT 6-24 HRS)    EKG   Radiology No results found.  Procedures Procedures (including critical care time)  Medications Ordered in UC Medications - No data to display  Initial Impression / Assessment and Plan / UC Course  I have reviewed the triage vital signs and the nursing notes.  Pertinent labs & imaging results that were available during my care of the patient were reviewed by me and considered in my medical decision making (see chart for details).     Patient has chosen against coronavirus vaccinations Coronavirus testing is done This appears to be recurring bronchitis given emphysema/COPD  Final Clinical Impressions(s) / UC Diagnoses   Final diagnoses:  LRTI (lower respiratory tract infection)     Discharge Instructions     Drink plenty of fluids Continue Mucinex/guaifenesin for cough Take Z-Pak as directed Use albuterol inhaler as needed Return as needed   ED Prescriptions    Medication Sig Dispense Auth. Provider   azithromycin (ZITHROMAX Z-PAK) 250 MG tablet Take two pills today followed by one a day until gone 6 tablet Meda Coffee Jennette Banker, MD     PDMP not reviewed this  encounter.   Raylene Everts, MD 05/29/20 1330

## 2020-05-30 LAB — SARS CORONAVIRUS 2 (TAT 6-24 HRS): SARS Coronavirus 2: NEGATIVE

## 2020-06-15 ENCOUNTER — Encounter: Payer: Self-pay | Admitting: Family Medicine

## 2020-06-15 ENCOUNTER — Other Ambulatory Visit: Payer: Self-pay

## 2020-06-15 ENCOUNTER — Ambulatory Visit (INDEPENDENT_AMBULATORY_CARE_PROVIDER_SITE_OTHER): Payer: Managed Care, Other (non HMO) | Admitting: Family Medicine

## 2020-06-15 ENCOUNTER — Telehealth: Payer: Self-pay

## 2020-06-15 VITALS — BP 140/83 | HR 57 | Temp 97.3°F | Ht 72.84 in | Wt 220.6 lb

## 2020-06-15 DIAGNOSIS — N182 Chronic kidney disease, stage 2 (mild): Secondary | ICD-10-CM

## 2020-06-15 DIAGNOSIS — E039 Hypothyroidism, unspecified: Secondary | ICD-10-CM

## 2020-06-15 DIAGNOSIS — E291 Testicular hypofunction: Secondary | ICD-10-CM

## 2020-06-15 DIAGNOSIS — I129 Hypertensive chronic kidney disease with stage 1 through stage 4 chronic kidney disease, or unspecified chronic kidney disease: Secondary | ICD-10-CM | POA: Diagnosis not present

## 2020-06-15 DIAGNOSIS — E0840 Diabetes mellitus due to underlying condition with diabetic neuropathy, unspecified: Secondary | ICD-10-CM

## 2020-06-15 DIAGNOSIS — N2581 Secondary hyperparathyroidism of renal origin: Secondary | ICD-10-CM

## 2020-06-15 DIAGNOSIS — E782 Mixed hyperlipidemia: Secondary | ICD-10-CM

## 2020-06-15 LAB — URINALYSIS, ROUTINE W REFLEX MICROSCOPIC
Bilirubin, UA: NEGATIVE
Ketones, UA: NEGATIVE
Leukocytes,UA: NEGATIVE
Nitrite, UA: NEGATIVE
Specific Gravity, UA: 1.02 (ref 1.005–1.030)
Urobilinogen, Ur: 0.2 mg/dL (ref 0.2–1.0)
pH, UA: 5.5 (ref 5.0–7.5)

## 2020-06-15 LAB — MICROSCOPIC EXAMINATION
Bacteria, UA: NONE SEEN
WBC, UA: NONE SEEN /hpf (ref 0–5)

## 2020-06-15 LAB — BAYER DCA HB A1C WAIVED: HB A1C (BAYER DCA - WAIVED): 8.1 % — ABNORMAL HIGH (ref <7.0)

## 2020-06-15 LAB — MICROALBUMIN, URINE WAIVED
Creatinine, Urine Waived: 200 mg/dL (ref 10–300)
Microalb, Ur Waived: 150 mg/L — ABNORMAL HIGH (ref 0–19)

## 2020-06-15 MED ORDER — LOSARTAN POTASSIUM 50 MG PO TABS: ORAL_TABLET | ORAL | 1 refills | Status: DC

## 2020-06-15 MED ORDER — OZEMPIC (0.25 OR 0.5 MG/DOSE) 2 MG/1.5ML ~~LOC~~ SOPN: 0.5000 mg | PEN_INJECTOR | SUBCUTANEOUS | 3 refills | Status: DC

## 2020-06-15 MED ORDER — HYDROCHLOROTHIAZIDE 25 MG PO TABS: 25.0000 mg | ORAL_TABLET | Freq: Every day | ORAL | 1 refills | Status: DC

## 2020-06-15 MED ORDER — AMLODIPINE BESYLATE 10 MG PO TABS: 10.0000 mg | ORAL_TABLET | Freq: Every day | ORAL | 1 refills | Status: DC

## 2020-06-15 MED ORDER — ALBUTEROL SULFATE HFA 108 (90 BASE) MCG/ACT IN AERS: 2.0000 | INHALATION_SPRAY | RESPIRATORY_TRACT | 1 refills | Status: DC | PRN

## 2020-06-15 MED ORDER — ROSUVASTATIN CALCIUM 10 MG PO TABS: 10.0000 mg | ORAL_TABLET | Freq: Every day | ORAL | 1 refills | Status: DC

## 2020-06-15 MED ORDER — FENOFIBRATE 160 MG PO TABS: ORAL_TABLET | ORAL | 1 refills | Status: DC

## 2020-06-15 MED ORDER — JANUMET XR 50-1000 MG PO TB24: 2.0000 | ORAL_TABLET | Freq: Every day | ORAL | 1 refills | Status: DC

## 2020-06-15 NOTE — Assessment & Plan Note (Signed)
Rechecking labs today. Await results. Treat as needed.  °

## 2020-06-15 NOTE — Assessment & Plan Note (Signed)
Rechecking levels today. Await results. Treat as needed.  

## 2020-06-15 NOTE — Progress Notes (Signed)
BP 140/83 (BP Location: Left Arm, Patient Position: Sitting, Cuff Size: Normal)   Pulse (!) 57   Temp (!) 97.3 F (36.3 C) (Oral)   Ht 6' 0.84" (1.85 m)   Wt 220 lb 9.6 oz (100.1 kg)   SpO2 95%   BMI 29.24 kg/m    Subjective:    Patient ID: Manuel Burnett, male    DOB: 12-11-1956, 64 y.o.   MRN: [id number]  HPI: Manuel Burnett is a 64 y.o. male  Chief Complaint  Patient presents with  . Diabetes   Had some bronchitis. Saw the urgent care and they got him all taken care of. Feeling better.   DIABETES- had a rash with the trulicity, so he took himself off of it Hypoglycemic episodes:no Polydipsia/polyuria: no Visual disturbance: no Chest pain: no Paresthesias: no Glucose Monitoring: yes  Accucheck frequency: occasionally  Fasting glucose: 140 Taking Insulin?: no Blood Pressure Monitoring: not checking Retinal Examination: Up to Date Foot Exam: Up to Date Diabetic Education: Completed Pneumovax: Up to Date Influenza: Up to Date Aspirin: yes  HYPERTENSION / HYPERLIPIDEMIA Satisfied with current treatment? yes Duration of hypertension: chronic BP monitoring frequency: not checking BP medication side effects: no Past BP meds: amlodipine, losartan, HCTZ Duration of hyperlipidemia: chronic Cholesterol medication side effects: no Cholesterol supplements: none Past cholesterol medications: crestor and fenofibrate Medication compliance: excellent compliance Aspirin: yes Recent stressors: no Recurrent headaches: no Visual changes: no Palpitations: no Dyspnea: no Chest pain: no Lower extremity edema: no Dizzy/lightheaded: no  LOW TESTOSTERONE Duration: chronic Status: controlled  Satisfied with current treatment:  yes Medication side effects:  no Medication compliance: excellent compliance Decreased libido: no Fatigue: no Depressed mood: no Muscle weakness: no Erectile dysfunction: no  HYPOTHYROIDISM Thyroid control status:controlled Satisfied with  current treatment? yes Medication side effects: yes Medication compliance: excellent compliance Etiology of hypothyroidism:  Recent dose adjustment:no Fatigue: no Cold intolerance: no Heat intolerance: no Weight gain: no Weight loss: no Constipation: no Diarrhea/loose stools: no Palpitations: no Lower extremity edema: no Anxiety/depressed mood: no   Relevant past medical, surgical, family and social history reviewed and updated as indicated. Interim medical history since our last visit reviewed. Allergies and medications reviewed and updated.  Review of Systems  Constitutional: Negative.   Respiratory: Negative.   Cardiovascular: Negative.   Gastrointestinal: Negative.   Musculoskeletal: Negative.   Skin: Positive for rash. Negative for color change, pallor and wound.  Neurological: Negative.   Psychiatric/Behavioral: Negative.     Per HPI unless specifically indicated above     Objective:    BP 140/83 (BP Location: Left Arm, Patient Position: Sitting, Cuff Size: Normal)   Pulse (!) 57   Temp (!) 97.3 F (36.3 C) (Oral)   Ht 6' 0.84" (1.85 m)   Wt 220 lb 9.6 oz (100.1 kg)   SpO2 95%   BMI 29.24 kg/m   Wt Readings from Last 3 Encounters:  06/15/20 220 lb 9.6 oz (100.1 kg)  05/21/20 216 lb 3.2 oz (98.1 kg)  04/29/20 215 lb (97.5 kg)    Physical Exam Vitals and nursing note reviewed.  Constitutional:      General: He is not in acute distress.    Appearance: Normal appearance. He is not ill-appearing, toxic-appearing or diaphoretic.  HENT:     Head: Normocephalic and atraumatic.     Right Ear: External ear normal.     Left Ear: External ear normal.     Nose: Nose normal.     Mouth/Throat:  Mouth: Mucous membranes are moist.     Pharynx: Oropharynx is clear.  Eyes:     General: No scleral icterus.       Right eye: No discharge.        Left eye: No discharge.     Extraocular Movements: Extraocular movements intact.     Conjunctiva/sclera: Conjunctivae  normal.     Pupils: Pupils are equal, round, and reactive to light.  Cardiovascular:     Rate and Rhythm: Normal rate and regular rhythm.     Pulses: Normal pulses.     Heart sounds: Normal heart sounds. No murmur heard.  No friction rub. No gallop.   Pulmonary:     Effort: Pulmonary effort is normal. No respiratory distress.     Breath sounds: Normal breath sounds. No stridor. No wheezing, rhonchi or rales.  Chest:     Chest wall: No tenderness.  Musculoskeletal:        General: Normal range of motion.     Cervical back: Normal range of motion and neck supple.  Skin:    General: Skin is warm and dry.     Capillary Refill: Capillary refill takes less than 2 seconds.     Coloration: Skin is not jaundiced or pale.     Findings: No bruising, erythema, lesion or rash.  Neurological:     General: No focal deficit present.     Mental Status: He is alert and oriented to person, place, and time. Mental status is at baseline.  Psychiatric:        Mood and Affect: Mood normal.        Behavior: Behavior normal.        Thought Content: Thought content normal.        Judgment: Judgment normal.     Results for orders placed or performed during the hospital encounter of 05/29/20  SARS CORONAVIRUS 2 (TAT 6-24 HRS) Nasopharyngeal Nasopharyngeal Swab   Specimen: Nasopharyngeal Swab  Result Value Ref Range   SARS Coronavirus 2 NEGATIVE NEGATIVE      Assessment & Plan:   Problem List Items Addressed This Visit      Endocrine   Diabetes mellitus due to underlying condition with diabetic neuropathy ([place])    Not under good control with A1c of 8.1. Cannot tolerate his trulicity due to rash. We will try changing to ozempic to see if that resolves rash. Call with any concerns. Continue to monitor.       Relevant Medications   rosuvastatin (CRESTOR) 10 MG tablet   losartan (COZAAR) 50 MG tablet   SitaGLIPtin-MetFORMIN HCl (JANUMET XR) 50-1000 MG TB24   Semaglutide,0.25 or 0.5MG /DOS, (OZEMPIC,  0.25 OR 0.5 MG/DOSE,) 2 MG/1.5ML SOPN   Other Relevant Orders   Bayer DCA Hb A1c Waived   Comprehensive metabolic panel   Microalbumin, Urine Waived   CBC with Differential/Platelet   Urinalysis, Routine w reflex microscopic   Male hypogonadism - Primary    Tolerating medicine well. Rechecking levels today. Await results. Continue current regimen.      Relevant Orders   Comprehensive metabolic panel   PSA   Testosterone, free, total([facility])   CBC with Differential/Platelet   Hypothyroidism    Rechecking levels today. Await results. Treat as needed.       Relevant Orders   Comprehensive metabolic panel   TSH   CBC with Differential/Platelet   Secondary hyperparathyroidism ([place])    Rechecking labs today. Await results. Treat as needed.  Relevant Orders   Comprehensive metabolic panel   PTH, Intact and Calcium   CBC with Differential/Platelet     Genitourinary   Benign hypertensive renal disease    Under good control on current regimen. Continue current regimen. Continue to monitor. Call with any concerns. Refills given. Labs drawn today.       Relevant Orders   Comprehensive metabolic panel   Microalbumin, Urine Waived   CBC with Differential/Platelet   Chronic kidney disease, stage II (mild)    Rechecking levels today. Await results. Treat as needed.       Relevant Orders   Comprehensive metabolic panel   CBC with Differential/Platelet   Urinalysis, Routine w reflex microscopic     Other   Hyperlipemia    Under good control on current regimen. Continue current regimen. Continue to monitor. Call with any concerns. Refills given. Labs drawn today.       Relevant Medications   rosuvastatin (CRESTOR) 10 MG tablet   losartan (COZAAR) 50 MG tablet   hydrochlorothiazide (HYDRODIURIL) 25 MG tablet   fenofibrate 160 MG tablet   amLODipine (NORVASC) 10 MG tablet   Other Relevant Orders   Comprehensive metabolic panel   Lipid Panel w/o Chol/HDL  Ratio   CBC with Differential/Platelet       Follow up plan: Return in about 3 months (around 09/15/2020), or Physical.

## 2020-06-15 NOTE — Assessment & Plan Note (Signed)
Under good control on current regimen. Continue current regimen. Continue to monitor. Call with any concerns. Refills given. Labs drawn today.   

## 2020-06-15 NOTE — Assessment & Plan Note (Signed)
Not under good control with A1c of 8.1. Cannot tolerate his trulicity due to rash. We will try changing to ozempic to see if that resolves rash. Call with any concerns. Continue to monitor.

## 2020-06-15 NOTE — Assessment & Plan Note (Signed)
Tolerating medicine well. Rechecking levels today. Await results. Continue current regimen.

## 2020-06-15 NOTE — Telephone Encounter (Signed)
PA for Ozempic initiated and submitted via Cover My Meds. Key:  [id number]

## 2020-06-16 NOTE — Telephone Encounter (Signed)
PA approved.

## 2020-06-20 LAB — PTH, INTACT AND CALCIUM: PTH: 21 pg/mL (ref 15–65)

## 2020-06-20 LAB — CBC WITH DIFFERENTIAL/PLATELET
Basophils Absolute: 0.1 10*3/uL (ref 0.0–0.2)
Basos: 1 %
EOS (ABSOLUTE): 0.2 10*3/uL (ref 0.0–0.4)
Eos: 3 %
Hematocrit: 44.7 % (ref 37.5–51.0)
Hemoglobin: 14.8 g/dL (ref 13.0–17.7)
Immature Grans (Abs): 0 10*3/uL (ref 0.0–0.1)
Immature Granulocytes: 0 %
Lymphocytes Absolute: 1.4 10*3/uL (ref 0.7–3.1)
Lymphs: 19 %
MCH: 29.6 pg (ref 26.6–33.0)
MCHC: 33.1 g/dL (ref 31.5–35.7)
MCV: 89 fL (ref 79–97)
Monocytes Absolute: 0.8 10*3/uL (ref 0.1–0.9)
Monocytes: 11 %
Neutrophils Absolute: 5 10*3/uL (ref 1.4–7.0)
Neutrophils: 66 %
Platelets: 160 10*3/uL (ref 150–450)
RBC: 5 x10E6/uL (ref 4.14–5.80)
RDW: 13 % (ref 11.6–15.4)
WBC: 7.6 10*3/uL (ref 3.4–10.8)

## 2020-06-20 LAB — COMPREHENSIVE METABOLIC PANEL
ALT: 12 IU/L (ref 0–44)
AST: 15 IU/L (ref 0–40)
Albumin/Globulin Ratio: 1.8 (ref 1.2–2.2)
Albumin: 4.3 g/dL (ref 3.8–4.8)
Alkaline Phosphatase: 68 IU/L (ref 48–121)
BUN/Creatinine Ratio: 14 (ref 10–24)
BUN: 16 mg/dL (ref 8–27)
Bilirubin Total: 0.5 mg/dL (ref 0.0–1.2)
CO2: 26 mmol/L (ref 20–29)
Calcium: 9.3 mg/dL (ref 8.6–10.2)
Chloride: 100 mmol/L (ref 96–106)
Creatinine, Ser: 1.14 mg/dL (ref 0.76–1.27)
GFR calc Af Amer: 79 mL/min/{1.73_m2} (ref >59)
GFR calc non Af Amer: 69 mL/min/{1.73_m2} (ref >59)
Globulin, Total: 2.4 g/dL (ref 1.5–4.5)
Glucose: 204 mg/dL — ABNORMAL HIGH (ref 65–99)
Potassium: 3.9 mmol/L (ref 3.5–5.2)
Sodium: 139 mmol/L (ref 134–144)
Total Protein: 6.7 g/dL (ref 6.0–8.5)

## 2020-06-20 LAB — LIPID PANEL W/O CHOL/HDL RATIO
Cholesterol, Total: 204 mg/dL — ABNORMAL HIGH (ref 100–199)
HDL: 40 mg/dL (ref >39)
LDL Chol Calc (NIH): 96 mg/dL (ref 0–99)
Triglycerides: 406 mg/dL — ABNORMAL HIGH (ref 0–149)
VLDL Cholesterol Cal: 68 mg/dL — ABNORMAL HIGH (ref 5–40)

## 2020-06-20 LAB — TESTOSTERONE, FREE, TOTAL, SHBG
Sex Hormone Binding: 25.9 nmol/L (ref 19.3–76.4)
Testosterone, Free: 8 pg/mL (ref 6.6–18.1)
Testosterone: 313 ng/dL (ref 264–916)

## 2020-06-20 LAB — TSH: TSH: 1.59 u[IU]/mL (ref 0.450–4.500)

## 2020-06-20 LAB — PSA: Prostate Specific Ag, Serum: 1.6 ng/mL (ref 0.0–4.0)

## 2020-06-29 ENCOUNTER — Telehealth: Payer: Self-pay

## 2020-06-29 NOTE — Telephone Encounter (Signed)
Called patient to let him know his paperwork is ready for pick up or fax. LVM for pt to return my call and let us know what he would prefer.

## 2020-06-30 ENCOUNTER — Telehealth: Payer: Self-pay

## 2020-06-30 NOTE — Telephone Encounter (Signed)
Health screening paperwork faxed over to [employer] employer service

## 2020-09-14 ENCOUNTER — Encounter: Payer: Self-pay | Admitting: Family Medicine

## 2020-09-14 ENCOUNTER — Other Ambulatory Visit: Payer: Self-pay

## 2020-09-14 ENCOUNTER — Telehealth: Payer: Self-pay

## 2020-09-14 ENCOUNTER — Ambulatory Visit (INDEPENDENT_AMBULATORY_CARE_PROVIDER_SITE_OTHER): Payer: Managed Care, Other (non HMO) | Admitting: Family Medicine

## 2020-09-14 VITALS — BP 136/72 | HR 59 | Temp 98.1°F | Ht 73.0 in | Wt 222.0 lb

## 2020-09-14 DIAGNOSIS — Z23 Encounter for immunization: Secondary | ICD-10-CM

## 2020-09-14 DIAGNOSIS — Z0000 Encounter for general adult medical examination without abnormal findings: Secondary | ICD-10-CM

## 2020-09-14 DIAGNOSIS — E0840 Diabetes mellitus due to underlying condition with diabetic neuropathy, unspecified: Secondary | ICD-10-CM

## 2020-09-14 LAB — BAYER DCA HB A1C WAIVED: HB A1C (BAYER DCA - WAIVED): 8.3 % — ABNORMAL HIGH (ref <7.0)

## 2020-09-14 MED ORDER — EMPAGLIFLOZIN 25 MG PO TABS: 25.0000 mg | ORAL_TABLET | Freq: Every day | ORAL | 3 refills | Status: DC

## 2020-09-14 NOTE — Progress Notes (Signed)
BP 136/72   Pulse (!) 59   Temp 98.1 F (36.7 C) (Oral)   Ht 6\' 1"  (1.854 m)   Wt 222 lb (100.7 kg)   SpO2 97%   BMI 29.29 kg/m    Subjective:    Patient ID: Manuel Burnett, male    DOB: [birth year], 64 y.o.   MRN: [id number]  HPI: Manuel Burnett is a 63 y.o. male presenting on 09/14/2020 for comprehensive medical examination. Current medical complaints include:  DIABETES- ended up with a rash from the ozempic Hypoglycemic episodes:no Polydipsia/polyuria: yes Visual disturbance: no Chest pain: no Paresthesias: no Glucose Monitoring: yes  Accucheck frequency: Daily  Fasting glucose: 140 Taking Insulin?: no Blood Pressure Monitoring: not checking Retinal Examination: Not up to Date Foot Exam: done today Diabetic Education: Completed Pneumovax: Up to Date Influenza: Up to Date Aspirin: yes  Interim Problems from his last visit: no  Depression Screen done today and results listed below:  Depression screen [facility] 2/9 08/08/2019 07/09/2018 04/03/2017 04/03/2017 02/02/2016  Decreased Interest 1 0 0 0 1  Down, Depressed, Hopeless 0 0 0 0 1  PHQ - 2 Score 1 0 0 0 2  Altered sleeping - 0 0 - 0  Tired, decreased energy - 0 0 - 1  Change in appetite - 0 0 - 1  Feeling bad or failure about yourself  - 0 0 - 0  Trouble concentrating - 0 0 - 0  Moving slowly or fidgety/restless - 0 0 - 0  Suicidal thoughts - 0 0 - 0  PHQ-9 Score - 0 0 - 4  Difficult doing work/chores - Not difficult at all - - Somewhat difficult   Past Medical History:  Past Medical History:  Diagnosis Date  . Cancer ([place])    SKIN  . Chronic kidney disease   . Diabetes mellitus without complication ([place])   . Erectile dysfunction   . GERD (gastroesophageal reflux disease)   . Hyperlipidemia   . Hypertension   . Hypogonadism male   . Hypothyroidism   . Proteinuria   . Sleep apnea    CPAP  . Thyroid disease     Surgical History:  Past Surgical History:  Procedure Laterality Date  . CATARACT  EXTRACTION W/PHACO Right 12/26/2017   Procedure: CATARACT EXTRACTION PHACO AND INTRAOCULAR LENS PLACEMENT (IOC);  Surgeon: Birder Robson, MD;  Location: [facility] ORS;  Service: Ophthalmology;  Laterality: Right;  [place] 00:21.8 AP% 21.1 CDE 2.64 FLUID PACK LOT # [id number]  . CATARACT EXTRACTION W/PHACO Left 01/09/2018   Procedure: CATARACT EXTRACTION PHACO AND INTRAOCULAR LENS PLACEMENT (IOC);  Surgeon: Birder Robson, MD;  Location: [facility] ORS;  Service: Ophthalmology;  Laterality: Left;  [place] 00:28.9 AP% 10.0 CDE 2.90 Fluid pcak lot # [id number]  . CLAVICLE SURGERY     internal fixation  . COLONOSCOPY WITH PROPOFOL N/A 04/06/2020   Procedure: COLONOSCOPY WITH PROPOFOL;  Surgeon: Lin Landsman, MD;  Location: [facility]COPY;  Service: Gastroenterology;  Laterality: N/A;  priority 4  . EYE SURGERY Bilateral 12/2017   Cataract  . FRACTURE SURGERY    . HEMORRHOID SURGERY    . TONSILLECTOMY    . TONSILLECTOMY AND ADENOIDECTOMY      Medications:  Current Outpatient Medications on File Prior to Visit  Medication Sig  . albuterol (PROAIR HFA) 108 (90 Base) MCG/ACT inhaler Inhale 2 puffs into the lungs every 4 (four) hours as needed for wheezing or shortness of breath.  Marland Kitchen amLODipine (NORVASC) 10 MG tablet Take 1  tablet (10 mg total) by mouth daily.  . B-D INTEGRA SYRINGE 22G X 1-1/2" 3 ML MISC Inject testosterone 1x every 2 weeks  . fenofibrate 160 MG tablet TAKE 1 TABLET BY MOUTH EVERY DAY WITH FOOD  . glucose blood test strip Use as instructed  . hydrochlorothiazide (HYDRODIURIL) 25 MG tablet Take 1 tablet (25 mg total) by mouth daily.  Marland Kitchen levothyroxine (SYNTHROID) 200 MCG tablet TAKE 1 TABLET(200 MCG) BY MOUTH DAILY  . losartan (COZAAR) 50 MG tablet TAKE 1 TABLET(50 MG) BY MOUTH DAILY  . Multiple Vitamin (MULTIVITAMIN WITH MINERALS) TABS tablet Take 1 tablet by mouth daily.  Marland Kitchen NEEDLE, DISP, 18 G (YALE DISP NEEDLES 18GX1") 18G X 1" MISC Inject testosterone every 2 weeks as directed  .  rosuvastatin (CRESTOR) 10 MG tablet Take 1 tablet (10 mg total) by mouth daily.  . sildenafil (VIAGRA) 100 MG tablet Take 1 tablet (100 mg total) by mouth daily as needed for erectile dysfunction.  . SitaGLIPtin-MetFORMIN HCl (JANUMET XR) 50-1000 MG TB24 Take 2 tablets by mouth daily.  Marland Kitchen testosterone cypionate (DEPOTESTOSTERONE CYPIONATE) 200 MG/ML injection ADMINISTER 0.5 ML IN THE MUSCLE EVERY 14 DAYS  . triamcinolone ointment (KENALOG) 0.5 % Apply 1 application topically 2 (two) times daily.   No current facility-administered medications on file prior to visit.    Allergies:  Allergies  Allergen Reactions  . Codeine Sulfate Rash  . Ozempic (0.25 Or 0.5 Mg-Dose) [Semaglutide(0.25 Or 0.5mg -Dos)] Rash  . Trulicity [Dulaglutide] Rash    Social History:  Social History   Socioeconomic History  . Marital status: Married    Spouse name: Not on file  . Number of children: Not on file  . Years of education: Not on file  . Highest education level: Not on file  Occupational History  . Not on file  Tobacco Use  . Smoking status: Current Every Day Smoker    Packs/day: 0.50    Types: Cigarettes  . Smokeless tobacco: Never Used  Vaping Use  . Vaping Use: Never used  Substance and Sexual Activity  . Alcohol use: Yes    Alcohol/week: 0.0 standard drinks  . Drug use: No  . Sexual activity: Not Currently  Other Topics Concern  . Not on file  Social History Narrative  . Not on file   Social Determinants of Health   Financial Resource Strain:   . Difficulty of Paying Living Expenses: Not on file  Food Insecurity:   . Worried About Charity fundraiser in the Last Year: Not on file  . Ran Out of Food in the Last Year: Not on file  Transportation Needs:   . Lack of Transportation (Medical): Not on file  . Lack of Transportation (Non-Medical): Not on file  Physical Activity:   . Days of Exercise per Week: Not on file  . Minutes of Exercise per Session: Not on file  Stress:   .  Feeling of Stress : Not on file  Social Connections:   . Frequency of Communication with Friends and Family: Not on file  . Frequency of Social Gatherings with Friends and Family: Not on file  . Attends Religious Services: Not on file  . Active Member of Clubs or Organizations: Not on file  . Attends Archivist Meetings: Not on file  . Marital Status: Not on file  Intimate Partner Violence:   . Fear of Current or Ex-Partner: Not on file  . Emotionally Abused: Not on file  . Physically Abused: Not on  file  . Sexually Abused: Not on file   Social History   Tobacco Use  Smoking Status Current Every Day Smoker  . Packs/day: 0.50  . Types: Cigarettes  Smokeless Tobacco Never Used   Social History   Substance and Sexual Activity  Alcohol Use Yes  . Alcohol/week: 0.0 standard drinks    Family History:  Family History  Problem Relation Age of Onset  . Hypertension Mother   . Heart disease Father   . Heart attack Father   . CAD Maternal Grandmother   . Cancer Maternal Grandfather     Past medical history, surgical history, medications, allergies, family history and social history reviewed with patient today and changes made to appropriate areas of the chart.   Review of Systems  Constitutional: Negative.   HENT: Negative.   Eyes: Negative.   Respiratory: Negative.   Cardiovascular: Negative.   Gastrointestinal: Negative.   Genitourinary: Negative.   Musculoskeletal: Positive for myalgias. Negative for back pain, falls, joint pain and neck pain.  Skin: Negative.   Neurological: Negative.   Endo/Heme/Allergies: Negative.   Psychiatric/Behavioral: Negative.     All other ROS negative except what is listed above and in the HPI.      Objective:    BP 136/72   Pulse (!) 59   Temp 98.1 F (36.7 C) (Oral)   Ht 6\' 1"  (1.854 m)   Wt 222 lb (100.7 kg)   SpO2 97%   BMI 29.29 kg/m   Wt Readings from Last 3 Encounters:  09/14/20 222 lb (100.7 kg)  06/15/20  220 lb 9.6 oz (100.1 kg)  05/21/20 216 lb 3.2 oz (98.1 kg)    Physical Exam Vitals and nursing note reviewed.  Constitutional:      General: He is not in acute distress.    Appearance: Normal appearance. He is normal weight. He is not ill-appearing, toxic-appearing or diaphoretic.  HENT:     Head: Normocephalic and atraumatic.     Right Ear: Tympanic membrane, ear canal and external ear normal. There is no impacted cerumen.     Left Ear: Tympanic membrane, ear canal and external ear normal. There is no impacted cerumen.     Nose: Nose normal. No congestion or rhinorrhea.     Mouth/Throat:     Mouth: Mucous membranes are moist.     Pharynx: Oropharynx is clear. No oropharyngeal exudate or posterior oropharyngeal erythema.  Eyes:     General: No scleral icterus.       Right eye: No discharge.        Left eye: No discharge.     Extraocular Movements: Extraocular movements intact.     Conjunctiva/sclera: Conjunctivae normal.     Pupils: Pupils are equal, round, and reactive to light.  Neck:     Vascular: No carotid bruit.  Cardiovascular:     Rate and Rhythm: Normal rate and regular rhythm.     Pulses: Normal pulses.     Heart sounds: No murmur heard.  No friction rub. No gallop.   Pulmonary:     Effort: Pulmonary effort is normal. No respiratory distress.     Breath sounds: Normal breath sounds. No stridor. No wheezing, rhonchi or rales.  Chest:     Chest wall: No tenderness.  Abdominal:     General: Abdomen is flat. Bowel sounds are normal. There is no distension.     Palpations: Abdomen is soft. There is no mass.     Tenderness: There is no abdominal  tenderness. There is no right CVA tenderness, left CVA tenderness, guarding or rebound.     Hernia: No hernia is present.  Genitourinary:    Comments: Genital exam deferred with shared decision making Musculoskeletal:        General: No swelling, tenderness, deformity or signs of injury.     Cervical back: Normal range of  motion and neck supple. No rigidity. No muscular tenderness.     Right lower leg: No edema.     Left lower leg: No edema.  Lymphadenopathy:     Cervical: No cervical adenopathy.  Skin:    General: Skin is warm and dry.     Capillary Refill: Capillary refill takes less than 2 seconds.     Coloration: Skin is not jaundiced or pale.     Findings: No bruising, erythema, lesion or rash.  Neurological:     General: No focal deficit present.     Mental Status: He is alert and oriented to person, place, and time.     Cranial Nerves: No cranial nerve deficit.     Sensory: No sensory deficit.     Motor: No weakness.     Coordination: Coordination normal.     Gait: Gait normal.     Deep Tendon Reflexes: Reflexes normal.  Psychiatric:        Mood and Affect: Mood normal.        Behavior: Behavior normal.        Thought Content: Thought content normal.        Judgment: Judgment normal.     Results for orders placed or performed in visit on 06/15/20  Microscopic Examination   Urine  Result Value Ref Range   WBC, UA None seen 0 - 5 /hpf   RBC 0-2 0 - 2 /hpf   Epithelial Cells (non renal) 0-10 0 - 10 /hpf   Bacteria, UA None seen None seen/Few  Bayer DCA Hb A1c Waived  Result Value Ref Range   HB A1C (BAYER DCA - WAIVED) 8.1 (H) <7.0 %  Comprehensive metabolic panel  Result Value Ref Range   Glucose 204 (H) 65 - 99 mg/dL   BUN 16 8 - 27 mg/dL   Creatinine, Ser 1.14 0.76 - 1.27 mg/dL   GFR calc non Af Amer 69 >59 mL/min/1.73   GFR calc Af Amer 79 >59 mL/min/1.73   BUN/Creatinine Ratio 14 10 - 24   Sodium 139 134 - 144 mmol/L   Potassium 3.9 3.5 - 5.2 mmol/L   Chloride 100 96 - 106 mmol/L   CO2 26 20 - 29 mmol/L   Calcium 9.3 8.6 - 10.2 mg/dL   Total Protein 6.7 6.0 - 8.5 g/dL   Albumin 4.3 3.8 - 4.8 g/dL   Globulin, Total 2.4 1.5 - 4.5 g/dL   Albumin/Globulin Ratio 1.8 1.2 - 2.2   Bilirubin Total 0.5 0.0 - 1.2 mg/dL   Alkaline Phosphatase 68 48 - 121 IU/L   AST 15 0 - 40 IU/L     ALT 12 0 - 44 IU/L  Lipid Panel w/o Chol/HDL Ratio  Result Value Ref Range   Cholesterol, Total 204 (H) 100 - 199 mg/dL   Triglycerides 406 (H) 0 - 149 mg/dL   HDL 40 >39 mg/dL   VLDL Cholesterol Cal 68 (H) 5 - 40 mg/dL   LDL Chol Calc (NIH) 96 0 - 99 mg/dL  Microalbumin, Urine Waived  Result Value Ref Range   Microalb, Ur Waived 150 (H) 0 - 19  mg/L   Creatinine, Urine Waived 200 10 - 300 mg/dL   Microalb/Creat Ratio 30-300 (H) <30 mg/g  PSA  Result Value Ref Range   Prostate Specific Ag, Serum 1.6 0.0 - 4.0 ng/mL  Testosterone, free, total([facility])  Result Value Ref Range   Testosterone 313 264 - 916 ng/dL   Testosterone, Free 8.0 6.6 - 18.1 pg/mL   Sex Hormone Binding 25.9 19.3 - 76.4 nmol/L  TSH  Result Value Ref Range   TSH 1.590 0.450 - 4.500 uIU/mL  PTH, Intact and Calcium  Result Value Ref Range   PTH 21 15 - 65 pg/mL   PTH Interp Comment   CBC with Differential/Platelet  Result Value Ref Range   WBC 7.6 3.4 - 10.8 x10E3/uL   RBC 5.00 4.14 - 5.80 x10E6/uL   Hemoglobin 14.8 13.0 - 17.7 g/dL   Hematocrit 44.7 37.5 - 51.0 %   MCV 89 79 - 97 fL   MCH 29.6 26.6 - 33.0 pg   MCHC 33.1 31 - 35 g/dL   RDW 13.0 11.6 - 15.4 %   Platelets 160 150 - 450 x10E3/uL   Neutrophils 66 Not Estab. %   Lymphs 19 Not Estab. %   Monocytes 11 Not Estab. %   Eos 3 Not Estab. %   Basos 1 Not Estab. %   Neutrophils Absolute 5.0 1 - 7 x10E3/uL   Lymphocytes Absolute 1.4 0 - 3 x10E3/uL   Monocytes Absolute 0.8 0 - 0 x10E3/uL   EOS (ABSOLUTE) 0.2 0.0 - 0.4 x10E3/uL   Basophils Absolute 0.1 0 - 0 x10E3/uL   Immature Granulocytes 0 Not Estab. %   Immature Grans (Abs) 0.0 0.0 - 0.1 x10E3/uL  Urinalysis, Routine w reflex microscopic  Result Value Ref Range   Specific Gravity, UA 1.020 1.005 - 1.030   pH, UA 5.5 5.0 - 7.5   Color, UA Yellow Yellow   Appearance Ur Clear Clear   Leukocytes,UA Negative Negative   Protein,UA 1+ (A) Negative/Trace   Glucose, UA 3+ (A) Negative    Ketones, UA Negative Negative   RBC, UA Trace (A) Negative   Bilirubin, UA Negative Negative   Urobilinogen, Ur 0.2 0.2 - 1.0 mg/dL   Nitrite, UA Negative Negative   Microscopic Examination See below:       Assessment & Plan:   Problem List Items Addressed This Visit      Endocrine   Diabetes mellitus due to underlying condition with diabetic neuropathy ([place])    Not doing great with A1c of 8.3 up from 8.1- will add jardiance and recheck 3 months. Call with any concerns.       Relevant Medications   empagliflozin (JARDIANCE) 25 MG TABS tablet   Other Relevant Orders   Bayer DCA Hb A1c Waived    Other Visit Diagnoses    Routine general medical examination at a health care facility    -  Primary   Vaccines up to date. Screening labs checked last visit. Colonoscopy up to date. Continue diet and exercise. Call with any concerns.    Flu vaccine need       Flu shot given today.    Relevant Orders   Flu Vaccine QUAD 36+ mos IM (Completed)       Discussed aspirin prophylaxis for myocardial infarction prevention and decision was made to continue ASA  LABORATORY TESTING:  Health maintenance labs ordered last visit as discussed above.   IMMUNIZATIONS:   - Tdap: Tetanus vaccination status reviewed: last tetanus  booster within 10 years. - Influenza: Administered today - Pneumovax: Administered today - Prevnar: Not applicable -COVID: Refused  SCREENING: - Colonoscopy: Up to date  Discussed with patient purpose of the colonoscopy is to detect colon cancer at curable precancerous or early stages   PATIENT COUNSELING:    Sexuality: Discussed sexually transmitted diseases, partner selection, use of condoms, avoidance of unintended pregnancy  and contraceptive alternatives.   Advised to avoid cigarette smoking.  I discussed with the patient that most people either abstain from alcohol or drink within safe limits (<=14/week and <=4 drinks/occasion for males, <=7/weeks and <= 3  drinks/occasion for females) and that the risk for alcohol disorders and other health effects rises proportionally with the number of drinks per week and how often a drinker exceeds daily limits.  Discussed cessation/primary prevention of drug use and availability of treatment for abuse.   Diet: Encouraged to adjust caloric intake to maintain  or achieve ideal body weight, to reduce intake of dietary saturated fat and total fat, to limit sodium intake by avoiding high sodium foods and not adding table salt, and to maintain adequate dietary potassium and calcium preferably from fresh fruits, vegetables, and low-fat dairy products.    stressed the importance of regular exercise  Injury prevention: Discussed safety belts, safety helmets, smoke detector, smoking near bedding or upholstery.   Dental health: Discussed importance of regular tooth brushing, flossing, and dental visits.   Follow up plan: NEXT PREVENTATIVE PHYSICAL DUE IN 1 YEAR. Return in about 3 months (around 12/14/2020).

## 2020-09-14 NOTE — Telephone Encounter (Signed)
PA for Jardiance initiated and submitted via Cover My Meds. Key: [id number]  Preferred alternatives are glipizide, metformin, pioglitazone, glimepride, and Lantus.

## 2020-09-14 NOTE — Patient Instructions (Signed)

## 2020-09-14 NOTE — Assessment & Plan Note (Signed)
Not doing great with A1c of 8.3 up from 8.1- will add jardiance and recheck 3 months. Call with any concerns.

## 2020-10-01 ENCOUNTER — Other Ambulatory Visit: Payer: Self-pay | Admitting: Family Medicine

## 2020-11-02 ENCOUNTER — Other Ambulatory Visit: Payer: Self-pay | Admitting: Family Medicine

## 2020-11-02 NOTE — Telephone Encounter (Signed)
Requested medication (s) are due for refill today: yes  Requested medication (s) are on the active medication list: yes  Last refill:  04/01/20  Future visit scheduled: yes  Notes to clinic:  med not assigned to a protocol   Requested Prescriptions  Pending Prescriptions Disp Refills   testosterone cypionate (DEPOTESTOSTERONE CYPIONATE) 200 MG/ML injection [Pharmacy Med Name: TESTOSTERONE CYP 200MG /ML SDV 1ML] 1 mL     Sig: ADMINISTER 0.5 ML IN THE MUSCLE EVERY 14 DAYS      Off-Protocol Failed - 11/02/2020  4:29 PM      Failed - Medication not assigned to a protocol, review manually.      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

## 2020-11-03 ENCOUNTER — Telehealth: Payer: Self-pay

## 2020-11-03 NOTE — Telephone Encounter (Signed)
PA started for Jardiance 25 mg tablets through Cover My meds, Key [id number], waiting on decision

## 2020-11-18 ENCOUNTER — Other Ambulatory Visit: Payer: Self-pay

## 2020-11-18 ENCOUNTER — Ambulatory Visit: Payer: Managed Care, Other (non HMO) | Admitting: Dermatology

## 2020-11-18 DIAGNOSIS — L300 Nummular dermatitis: Secondary | ICD-10-CM | POA: Diagnosis not present

## 2020-11-18 DIAGNOSIS — B353 Tinea pedis: Secondary | ICD-10-CM

## 2020-11-18 MED ORDER — CICLOPIROX OLAMINE 0.77 % EX CREA: TOPICAL_CREAM | Freq: Two times a day (BID) | CUTANEOUS | 3 refills | Status: DC

## 2020-11-18 MED ORDER — CLOBETASOL PROPIONATE 0.05 % EX OINT: 1.0000 "application " | TOPICAL_OINTMENT | Freq: Two times a day (BID) | CUTANEOUS | 0 refills | Status: DC

## 2020-11-18 NOTE — Patient Instructions (Addendum)
Start Ciclopirox 0.77% cream. Apply BID for 4 weeks between toes, on feet, and up to mid calves.    Start Clobetasol 0.05% ointment. Apply BID daily for up to 3 weeks, then use on weekends only.

## 2020-11-18 NOTE — Progress Notes (Signed)
   New Patient Visit  Subjective  Manuel Burnett is a 64 y.o. male who presents for the following: Skin Problem (Itching and scaling that comes and goes in pathes all over body on legs, feet, arms, right shoulder, ears. Pt states that he has been treating with Cortisone 10 eczema cream. Pt states that when he itches the areas they start to burn. Pt states that this has been going on for approx 4 years. ).  Objective  Well appearing patient in no apparent distress; mood and affect are within normal limits.  A focused examination was performed including arms, legs, and feet. Relevant physical exam findings are noted in the Assessment and Plan.  Objective  Left Upper Arm - Anterior, Right Foot - Anterior: Scaly pink nummular plaques  Objective  Left Foot - Anterior: Scaling and maceration web spaces and over distal and lateral soles.   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 Currently flared. Pt has had for 4 years.  Nummular dermatis VS bullous pemphigoid VS allergic contact dermatitis.  Consider biopsy if not responding well to topical steroid.  KOH negative.   Start Clobetasol ointment. Apply BID daily for up to 3 weeks, then use on weekends only.   Topical steroids (such as triamcinolone, fluocinolone, fluocinonide, mometasone, clobetasol, halobetasol, betamethasone, hydrocortisone) can cause thinning and lightening of the skin if they are used for too long in the same area. Your physician has selected the right strength medicine for your problem and area affected on the body. Please use your medication only as directed by your physician to prevent side effects.    Ordered Medications: ciclopirox (LOPROX) 0.77 % cream clobetasol ointment (TEMOVATE) 0.05 %  Tinea pedis of left foot Left Foot - Anterior  Start Ciclopirox 0.77% cream. Apply BID for 4 weeks between  toes, on feet, and up to mid calves.    Return in about 1 month (around 12/19/2020) for Recheck dermatitis.   I, Harriett Sine, CMA, am acting as scribe for Forest Gleason, MD.  Documentation: I have reviewed the above documentation for accuracy and completeness, and I agree with the above.  Forest Gleason, MD

## 2020-11-30 ENCOUNTER — Encounter: Payer: Self-pay | Admitting: Dermatology

## 2020-12-10 ENCOUNTER — Other Ambulatory Visit: Payer: Self-pay | Admitting: Family Medicine

## 2020-12-10 ENCOUNTER — Telehealth: Payer: Self-pay

## 2020-12-10 MED ORDER — EMPAGLIFLOZIN 25 MG PO TABS
25.0000 mg | ORAL_TABLET | Freq: Every day | ORAL | 3 refills | Status: DC
Start: 2020-12-10 — End: 2021-01-19

## 2020-12-10 NOTE — Telephone Encounter (Signed)
Called and spoke with patient, he states that he has only been on the Jardiance for 3 weeks, it took insurance 2 months to approve it. Patient is still going to come to his appt but he dont think it is a good idea to check a1c due to only being on the new medication for 3 weeks.   Verbal from [place] to change appt to February

## 2020-12-10 NOTE — Telephone Encounter (Signed)
Pt been on medication empagliflozin (JARDIANCE) 25 MG TABS tablet for 3 weeks only due to having issues with the insurance  has apt on 12/14/2020 pt stated if he needed to come to apt due to just starting medication

## 2020-12-10 NOTE — Addendum Note (Signed)
Addended by: Valerie Roys on: 12/10/2020 10:57 AM   Modules accepted: Orders

## 2020-12-14 ENCOUNTER — Ambulatory Visit: Payer: Managed Care, Other (non HMO) | Admitting: Family Medicine

## 2020-12-23 ENCOUNTER — Other Ambulatory Visit: Payer: Self-pay | Admitting: Family Medicine

## 2020-12-31 ENCOUNTER — Other Ambulatory Visit: Payer: Self-pay | Admitting: Dermatology

## 2020-12-31 DIAGNOSIS — L300 Nummular dermatitis: Secondary | ICD-10-CM

## 2021-01-07 ENCOUNTER — Other Ambulatory Visit: Payer: Self-pay

## 2021-01-07 ENCOUNTER — Ambulatory Visit (INDEPENDENT_AMBULATORY_CARE_PROVIDER_SITE_OTHER): Payer: Managed Care, Other (non HMO) | Admitting: Dermatology

## 2021-01-07 DIAGNOSIS — D489 Neoplasm of uncertain behavior, unspecified: Secondary | ICD-10-CM

## 2021-01-07 DIAGNOSIS — R21 Rash and other nonspecific skin eruption: Secondary | ICD-10-CM | POA: Diagnosis not present

## 2021-01-07 DIAGNOSIS — L309 Dermatitis, unspecified: Secondary | ICD-10-CM

## 2021-01-07 DIAGNOSIS — D485 Neoplasm of uncertain behavior of skin: Secondary | ICD-10-CM

## 2021-01-07 MED ORDER — TRIAMCINOLONE ACETONIDE 0.1 % EX CREA: 1.0000 "application " | TOPICAL_CREAM | Freq: Two times a day (BID) | CUTANEOUS | 11 refills | Status: DC | PRN

## 2021-01-07 MED ORDER — HYDROCORTISONE 2.5 % EX CREA: TOPICAL_CREAM | Freq: Two times a day (BID) | CUTANEOUS | 11 refills | Status: DC | PRN

## 2021-01-07 NOTE — Patient Instructions (Addendum)
Topical steroids (such as triamcinolone, fluocinolone, fluocinonide, mometasone, clobetasol, halobetasol, betamethasone, hydrocortisone) can cause thinning and lightening of the skin if they are used for too long in the same area. Your physician has selected the right strength medicine for your problem and area affected on the body. Please use your medication only as directed by your physician to prevent side effects.    Biopsy Wound Care Instructions  1. Leave the original bandage on for 24 hours if possible.  If the bandage becomes soaked or soiled before that time, it is OK to remove it and examine the wound.  A small amount of post-operative bleeding is normal.  If excessive bleeding occurs, remove the bandage, place gauze over the site and apply continuous pressure (no peeking) over the area for 30 minutes. If this does not work, please call our clinic as soon as possible or page your doctor if it is after hours.   2. Once a day, cleanse the wound with soap and water. It is fine to shower. If a thick crust develops you may use a Q-tip dipped into dilute hydrogen peroxide (mix 1:1 with water) to dissolve it.  Hydrogen peroxide can slow the healing process, so use it only as needed.    3. After washing, apply petroleum jelly (Vaseline) or an antibiotic ointment if your doctor prescribed one for you, followed by a bandage.    4. For best healing, the wound should be covered with a layer of ointment at all times. If you are not able to keep the area covered with a bandage to hold the ointment in place, this may mean re-applying the ointment several times a day.  Continue this wound care until the wound has healed and is no longer open.   Itching and mild discomfort is normal during the healing process. However, if you develop pain or severe itching, please call our office.   If you have any discomfort, you can take Tylenol (acetaminophen) or ibuprofen as directed on the bottle. (Please do not take  these if you have an allergy to them or cannot take them for another reason).  Some redness, tenderness and white or yellow material in the wound is normal healing.  If the area becomes very sore and red, or develops a thick yellow-green material (pus), it may be infected; please notify us.    If you have stitches, return to clinic as directed to have the stitches removed. You will continue wound care for 2-3 days after the stitches are removed.   Wound healing continues for up to one year following surgery. It is not unusual to experience pain in the scar from time to time during the interval.  If the pain becomes severe or the scar thickens, you should notify the office.    A slight amount of redness in a scar is expected for the first six months.  After six months, the redness will fade and the scar will soften and fade.  The color difference becomes less noticeable with time.  If there are any problems, return for a post-op surgery check at your earliest convenience.  To improve the appearance of the scar, you can use silicone scar gel, cream, or sheets (such as Mederma or Serica) every night for up to one year. These are available over the counter (without a prescription).  Please call our office at [phone] for any questions or concerns.   Recommend OTC [place] Rapid Relief Anti-Itch cream (pramoxine + menthol) up to 3  times per day to areas that are itchy.   Gentle Skin Care Guide  1. Bathe no more than once a day.  2. Avoid bathing in hot water  3. Use a mild soap like Dove, Vanicream, Cetaphil, CeraVe. Can use Lever 2000 or Cetaphil antibacterial soap  4. Use soap only where you need it. On most days, use it under your arms, between your legs, and on your feet. Let the water rinse other areas unless visibly dirty.  5. When you get out of the bath/shower, use a towel to gently blot your skin dry, don't rub it.  6. While your skin is still a little damp, apply a  moisturizing cream such as Vanicream, CeraVe, Cetaphil, Eucerin, Sarna lotion or plain Vaseline Jelly. For hands apply Neutrogena [place] Hand Cream or Excipial Hand Cream.  7. Reapply moisturizer any time you start to itch or feel dry.  8. Sometimes using free and clear laundry detergents can be helpful. Fabric softener sheets should be avoided. Downy Free & Gentle liquid, or any liquid fabric softener that is free of dyes and perfumes, it acceptable to use  9. If your doctor has given you prescription creams you may apply moisturizers over them

## 2021-01-07 NOTE — Progress Notes (Signed)
Follow-Up Visit   Subjective  Manuel Burnett is a 65 y.o. male who presents for the following: Follow-up (Patient here today for 1 month follow up on dermatitis. Patient was prescribed ciclopirox 0.77 % cream for tinea pedis and clobetasol ointment for dermatitis. He states cream and ointment have helped. He reports still having break outs under left axilla and under left arm, right axilla and under right arm. He believes it may be coming from dryer sheets his wife was using. He reports telling her to stop and break outs seem to be better.  The following portions of the chart were reviewed this encounter and updated as appropriate:  Tobacco  Allergies  Meds  Problems  Med Hx  Surg Hx  Fam Hx       Objective  Well appearing patient in no apparent distress; mood and affect are within normal limits.  A focused examination was performed including feet, legs, back, bilateral arm, bilateral axilla, forehead. Relevant physical exam findings are noted in the Assessment and Plan.  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 Condition is bothersome to patient. Improved with medication but still flaring up and moving around. Not currently at goal.  Allergic contact dermatitis vs nummular dermatitis vs other   Possibly secondary to dryer sheets as he noticed it seems worse when his wife has been using them vs when not. She stopped using them last week.  Start Hydrocortisone 2.5 % cream twice a day for up to 2 weeks on forehead  Start Triamcinolone 0.1 % cream twice daily to affected areas on body (pt prefers cream to ointment). Avoid applying to face, groin, and axilla. Use as directed. Risk of skin atrophy with long-term use  reviewed.   Use clobetasol ointment only to stubborn areas on body not responding to triamcinolone due to higher risk of side effects with superpotent topical steroid.   Recommend complete avoidance of dryer sheets. If still breaking out at follow-up recommend patch testing- he defers today  Topical steroids (such as triamcinolone, fluocinolone, fluocinonide, mometasone, clobetasol, halobetasol, betamethasone, hydrocortisone) can cause thinning and lightening of the skin if they are used for too long in the same area. Your physician has selected the right strength medicine for your problem and area affected on the body. Please use your medication only as directed by your physician to prevent side effects.     Ordered Medications: triamcinolone (KENALOG) 0.1 %  Rash and other nonspecific skin eruption  Ordered Medications: hydrocortisone 2.5 % cream  Neoplasm of uncertain behavior Right Upper Back  Epidermal / dermal shaving  Lesion diameter (cm):  0.5 Informed consent: discussed and consent obtained   Timeout: patient name, date of birth, surgical site, and procedure verified   Patient was prepped and draped in usual sterile fashion: area prepped with isopropyl alcohol. Anesthesia: the lesion was anesthetized in a standard fashion   Anesthetic:  1% lidocaine w/ epinephrine 1-100,000 buffered w/ 8.4% NaHCO3 Instrument used: DermaBlade   Hemostasis achieved with: aluminum chloride   Outcome: patient tolerated procedure well   Post-procedure details: wound care instructions given   Additional details:  Mupirocin and a bandage applied  R/o atypia   Pathology sent to [facility]  Other Related  Procedures Pathology ([facility])  Return in about 6 weeks (around 02/18/2021) for follow up on dermatitis and rash .  I, Ruthell Rummage, CMA, am acting as scribe for Forest Gleason, MD.  Documentation: I have reviewed the above documentation for accuracy and completeness, and I agree with the  above.  Forest Gleason, MD

## 2021-01-08 ENCOUNTER — Other Ambulatory Visit: Payer: Self-pay | Admitting: Dermatology

## 2021-01-08 ENCOUNTER — Encounter: Payer: Self-pay | Admitting: Dermatology

## 2021-01-08 DIAGNOSIS — D239 Other benign neoplasm of skin, unspecified: Secondary | ICD-10-CM

## 2021-01-08 HISTORY — DX: Other benign neoplasm of skin, unspecified: D23.9

## 2021-01-13 LAB — ANATOMIC PATHOLOGY REPORT

## 2021-01-19 ENCOUNTER — Other Ambulatory Visit: Payer: Self-pay

## 2021-01-19 ENCOUNTER — Telehealth: Payer: Self-pay

## 2021-01-19 MED ORDER — EMPAGLIFLOZIN 25 MG PO TABS: 25.0000 mg | ORAL_TABLET | Freq: Every day | ORAL | 1 refills | Status: DC

## 2021-01-19 NOTE — Telephone Encounter (Signed)
Called and discussed biopsy results and other recommendations with patient. He verbalized understanding and denied further questions at this time.

## 2021-01-19 NOTE — Telephone Encounter (Signed)
Pharmacy sent a fax regarding patient's Jardiance. States that insurance requests a 28 day RX. Can we send this in please?

## 2021-01-19 NOTE — Telephone Encounter (Signed)
-----   Message from Alfonso Patten, MD sent at 01/19/2021  3:05 PM EST ----- Specimen A-Skin Excision, right upper back: COMPOUND  DYSPLASTIC NEVUS WITH MILD ATYPIA, MARGINS UNINVOLVED  This is a MILDLY ATYPICAL MOLE. On the spectrum from normal mole to melanoma skin cancer, this is in between but it is much closer to a normal mole.  - These typically do not progress to melanoma or cause any trouble.  - People who have a history of atypical moles do have a slightly increased risk of developing melanoma somewhere on the body, so a yearly full body skin exam by a dermatologist is recommended.  - Monthly self skin checks and daily sun protection are also recommended.  - Please call if you notice a dark spot coming back where this biopsy was taken.  - Please also call if you notice any new or changing spots anywhere else on the body before your follow-up visit.  - Please call our office or send [place] a message if you have any questions or concerns about this biopsy result.   MAs please call. Thank you!

## 2021-02-12 ENCOUNTER — Ambulatory Visit (INDEPENDENT_AMBULATORY_CARE_PROVIDER_SITE_OTHER): Payer: Managed Care, Other (non HMO) | Admitting: Family Medicine

## 2021-02-12 ENCOUNTER — Encounter: Payer: Self-pay | Admitting: Family Medicine

## 2021-02-12 ENCOUNTER — Other Ambulatory Visit: Payer: Self-pay

## 2021-02-12 VITALS — BP 133/73 | HR 62 | Temp 98.5°F | Wt 213.4 lb

## 2021-02-12 DIAGNOSIS — I129 Hypertensive chronic kidney disease with stage 1 through stage 4 chronic kidney disease, or unspecified chronic kidney disease: Secondary | ICD-10-CM

## 2021-02-12 DIAGNOSIS — E0840 Diabetes mellitus due to underlying condition with diabetic neuropathy, unspecified: Secondary | ICD-10-CM | POA: Diagnosis not present

## 2021-02-12 DIAGNOSIS — E782 Mixed hyperlipidemia: Secondary | ICD-10-CM | POA: Diagnosis not present

## 2021-02-12 LAB — BAYER DCA HB A1C WAIVED: HB A1C (BAYER DCA - WAIVED): 8.6 % — ABNORMAL HIGH (ref <7.0)

## 2021-02-12 MED ORDER — AMLODIPINE BESYLATE 10 MG PO TABS: 10.0000 mg | ORAL_TABLET | Freq: Every day | ORAL | 1 refills | Status: DC

## 2021-02-12 MED ORDER — METFORMIN HCL ER (MOD) 500 MG PO TB24: 1000.0000 mg | ORAL_TABLET | Freq: Two times a day (BID) | ORAL | 1 refills | Status: DC

## 2021-02-12 MED ORDER — SILDENAFIL CITRATE 100 MG PO TABS: 100.0000 mg | ORAL_TABLET | Freq: Every day | ORAL | 12 refills | Status: DC | PRN

## 2021-02-12 MED ORDER — FENOFIBRATE 160 MG PO TABS: 160.0000 mg | ORAL_TABLET | Freq: Every day | ORAL | 1 refills | Status: DC

## 2021-02-12 MED ORDER — ALBUTEROL SULFATE HFA 108 (90 BASE) MCG/ACT IN AERS: 2.0000 | INHALATION_SPRAY | RESPIRATORY_TRACT | 1 refills | Status: DC | PRN

## 2021-02-12 MED ORDER — RYBELSUS 3 MG PO TABS: 3.0000 mg | ORAL_TABLET | Freq: Every day | ORAL | 2 refills | Status: DC

## 2021-02-12 MED ORDER — LOSARTAN POTASSIUM 50 MG PO TABS: 50.0000 mg | ORAL_TABLET | Freq: Every day | ORAL | 0 refills | Status: DC

## 2021-02-12 MED ORDER — ROSUVASTATIN CALCIUM 10 MG PO TABS: ORAL_TABLET | ORAL | 1 refills | Status: DC

## 2021-02-12 MED ORDER — HYDROCHLOROTHIAZIDE 25 MG PO TABS: 25.0000 mg | ORAL_TABLET | Freq: Every day | ORAL | 1 refills | Status: DC

## 2021-02-12 NOTE — Assessment & Plan Note (Signed)
Under good control on current regimen. Continue current regimen. Continue to monitor. Call with any concerns. Refills given. Labs drawn today.   

## 2021-02-12 NOTE — Assessment & Plan Note (Signed)
Not doing well with A1c of 8.6- will change from janumet to metformin and rybelsus. May have allergic reaction as has had issues with trulicity and ozempic. Continue jardiance. May need to start insulin if has allergic reaction. Call with any concerns. Recheck 3 months.

## 2021-02-12 NOTE — Progress Notes (Signed)
BP 133/73   Pulse 62   Temp 98.5 F (36.9 C)   Wt 213 lb 6.4 oz (96.8 kg)   SpO2 97%   BMI 28.15 kg/m    Subjective:    Patient ID: Manuel Burnett, male    DOB: 1956-01-20, 65 y.o.   MRN: [id number]  HPI: Manuel Burnett is a 65 y.o. male  Chief Complaint  Patient presents with  . Diabetes   HYPERTENSION / HYPERLIPIDEMIA Satisfied with current treatment? yes Duration of hypertension: chronic BP monitoring frequency: not checking BP medication side effects: no Past BP meds: losartan, HCTZ, amlodipine Duration of hyperlipidemia: chronic Cholesterol medication side effects: no Cholesterol supplements: none Past cholesterol medications: crestor, fenofibrate Medication compliance: excellent compliance Aspirin: no Recent stressors: no Recurrent headaches: no Visual changes: no Palpitations: no Dyspnea: no Chest pain: no Lower extremity edema: no Dizzy/lightheaded: no  DIABETES Hypoglycemic episodes:no Polydipsia/polyuria: no Visual disturbance: no Chest pain: no Paresthesias: no Glucose Monitoring: yes  Accucheck frequency: occasionally Taking Insulin?: no Blood Pressure Monitoring: not checking Retinal Examination: Not up to Date Foot Exam: Up to Date Diabetic Education: Completed Pneumovax: Up to Date Influenza: Up to Date Aspirin: yes  Relevant past medical, surgical, family and social history reviewed and updated as indicated. Interim medical history since our last visit reviewed. Allergies and medications reviewed and updated.  Review of Systems  Constitutional: Negative.   Respiratory: Negative.   Cardiovascular: Negative.   Gastrointestinal: Negative.   Musculoskeletal: Negative.   Neurological: Negative.   Psychiatric/Behavioral: Negative.     Per HPI unless specifically indicated above     Objective:    BP 133/73   Pulse 62   Temp 98.5 F (36.9 C)   Wt 213 lb 6.4 oz (96.8 kg)   SpO2 97%   BMI 28.15 kg/m   Wt Readings from  Last 3 Encounters:  02/12/21 213 lb 6.4 oz (96.8 kg)  09/14/20 222 lb (100.7 kg)  06/15/20 220 lb 9.6 oz (100.1 kg)    Physical Exam Vitals and nursing note reviewed.  Constitutional:      General: He is not in acute distress.    Appearance: Normal appearance. He is not ill-appearing, toxic-appearing or diaphoretic.  HENT:     Head: Normocephalic and atraumatic.     Right Ear: External ear normal.     Left Ear: External ear normal.     Nose: Nose normal.     Mouth/Throat:     Mouth: Mucous membranes are moist.     Pharynx: Oropharynx is clear.  Eyes:     General: No scleral icterus.       Right eye: No discharge.        Left eye: No discharge.     Extraocular Movements: Extraocular movements intact.     Conjunctiva/sclera: Conjunctivae normal.     Pupils: Pupils are equal, round, and reactive to light.  Cardiovascular:     Rate and Rhythm: Normal rate and regular rhythm.     Pulses: Normal pulses.     Heart sounds: Normal heart sounds. No murmur heard. No friction rub. No gallop.   Pulmonary:     Effort: Pulmonary effort is normal. No respiratory distress.     Breath sounds: Normal breath sounds. No stridor. No wheezing, rhonchi or rales.  Chest:     Chest wall: No tenderness.  Musculoskeletal:        General: Normal range of motion.     Cervical back: Normal range of motion  and neck supple.  Skin:    General: Skin is warm and dry.     Capillary Refill: Capillary refill takes less than 2 seconds.     Coloration: Skin is not jaundiced or pale.     Findings: No bruising, erythema, lesion or rash.  Neurological:     General: No focal deficit present.     Mental Status: He is alert and oriented to person, place, and time. Mental status is at baseline.  Psychiatric:        Mood and Affect: Mood normal.        Behavior: Behavior normal.        Thought Content: Thought content normal.        Judgment: Judgment normal.     Results for orders placed or performed in visit  on 01/08/21  Anatomic Pathology Report  Result Value Ref Range   Diagnosis synopsis: Comment    Specimen: Comment    Clinical history: Comment    Clinical diagnosis: Comment    Diagnosis: Comment    Comment: Comment    Gross description: Comment    Electronically signed by: Comment    CPT code(s): Comment    CPT Disclaimer: Comment    Clinician provided ICD: D48.9    Pathologist provided ICD: D48.5       Assessment & Plan:   Problem List Items Addressed This Visit      Endocrine   Diabetes mellitus due to underlying condition with diabetic neuropathy ([place]) - Primary    Not doing well with A1c of 8.6- will change from janumet to metformin and rybelsus. May have allergic reaction as has had issues with trulicity and ozempic. Continue jardiance. May need to start insulin if has allergic reaction. Call with any concerns. Recheck 3 months.       Relevant Medications   metFORMIN (GLUMETZA) 500 MG (MOD) 24 hr tablet   rosuvastatin (CRESTOR) 10 MG tablet   losartan (COZAAR) 50 MG tablet   Semaglutide (RYBELSUS) 3 MG TABS   Other Relevant Orders   Bayer DCA Hb A1c Waived   CBC with Differential/Platelet   Comprehensive metabolic panel     Genitourinary   Benign hypertensive renal disease    Under good control on current regimen. Continue current regimen. Continue to monitor. Call with any concerns. Refills given. Labs drawn today.        Relevant Orders   CBC with Differential/Platelet   Comprehensive metabolic panel     Other   Hyperlipemia    Under good control on current regimen. Continue current regimen. Continue to monitor. Call with any concerns. Refills given. Labs drawn today.       Relevant Medications   sildenafil (VIAGRA) 100 MG tablet   rosuvastatin (CRESTOR) 10 MG tablet   losartan (COZAAR) 50 MG tablet   hydrochlorothiazide (HYDRODIURIL) 25 MG tablet   fenofibrate 160 MG tablet   amLODipine (NORVASC) 10 MG tablet   Other Relevant Orders   CBC with  Differential/Platelet   Comprehensive metabolic panel   Lipid Panel w/o Chol/HDL Ratio       Follow up plan: Return in about 3 months (around 05/12/2021).

## 2021-02-13 LAB — COMPREHENSIVE METABOLIC PANEL
ALT: 12 IU/L (ref 0–44)
AST: 11 IU/L (ref 0–40)
Albumin/Globulin Ratio: 1.7 (ref 1.2–2.2)
Albumin: 4.5 g/dL (ref 3.8–4.8)
Alkaline Phosphatase: 68 IU/L (ref 44–121)
BUN/Creatinine Ratio: 11 (ref 10–24)
BUN: 14 mg/dL (ref 8–27)
Bilirubin Total: 0.6 mg/dL (ref 0.0–1.2)
CO2: 25 mmol/L (ref 20–29)
Calcium: 9.9 mg/dL (ref 8.6–10.2)
Chloride: 101 mmol/L (ref 96–106)
Creatinine, Ser: 1.3 mg/dL — ABNORMAL HIGH (ref 0.76–1.27)
GFR calc Af Amer: 67 mL/min/{1.73_m2} (ref >59)
GFR calc non Af Amer: 58 mL/min/{1.73_m2} — ABNORMAL LOW (ref >59)
Globulin, Total: 2.6 g/dL (ref 1.5–4.5)
Glucose: 206 mg/dL — ABNORMAL HIGH (ref 65–99)
Potassium: 3.7 mmol/L (ref 3.5–5.2)
Sodium: 141 mmol/L (ref 134–144)
Total Protein: 7.1 g/dL (ref 6.0–8.5)

## 2021-02-13 LAB — CBC WITH DIFFERENTIAL/PLATELET
Basophils Absolute: 0.1 10*3/uL (ref 0.0–0.2)
Basos: 1 %
EOS (ABSOLUTE): 0.3 10*3/uL (ref 0.0–0.4)
Eos: 4 %
Hematocrit: 48 % (ref 37.5–51.0)
Hemoglobin: 16.5 g/dL (ref 13.0–17.7)
Immature Grans (Abs): 0 10*3/uL (ref 0.0–0.1)
Immature Granulocytes: 0 %
Lymphocytes Absolute: 1.6 10*3/uL (ref 0.7–3.1)
Lymphs: 23 %
MCH: 29.7 pg (ref 26.6–33.0)
MCHC: 34.4 g/dL (ref 31.5–35.7)
MCV: 86 fL (ref 79–97)
Monocytes Absolute: 0.7 10*3/uL (ref 0.1–0.9)
Monocytes: 10 %
Neutrophils Absolute: 4.4 10*3/uL (ref 1.4–7.0)
Neutrophils: 62 %
Platelets: 169 10*3/uL (ref 150–450)
RBC: 5.56 x10E6/uL (ref 4.14–5.80)
RDW: 12.9 % (ref 11.6–15.4)
WBC: 7 10*3/uL (ref 3.4–10.8)

## 2021-02-13 LAB — LIPID PANEL W/O CHOL/HDL RATIO
Cholesterol, Total: 210 mg/dL — ABNORMAL HIGH (ref 100–199)
HDL: 38 mg/dL — ABNORMAL LOW (ref >39)
LDL Chol Calc (NIH): 105 mg/dL — ABNORMAL HIGH (ref 0–99)
Triglycerides: 390 mg/dL — ABNORMAL HIGH (ref 0–149)
VLDL Cholesterol Cal: 67 mg/dL — ABNORMAL HIGH (ref 5–40)

## 2021-02-16 ENCOUNTER — Telehealth: Payer: Self-pay

## 2021-02-16 MED ORDER — METFORMIN HCL ER 500 MG PO TB24: 1000.0000 mg | ORAL_TABLET | Freq: Two times a day (BID) | ORAL | 1 refills | Status: DC

## 2021-02-16 NOTE — Telephone Encounter (Signed)
PA for Rybelsus initiated and submitted via [place]. Key: [id number]

## 2021-02-16 NOTE — Telephone Encounter (Signed)
Received PA for the Metformin (MOD). Insurance will not cover this. Can we changer to a different metformin ER?

## 2021-02-16 NOTE — Telephone Encounter (Signed)
Sent to wrong provider, routing to correct one now.

## 2021-02-24 NOTE — Telephone Encounter (Signed)
Patient notified of approval. States he has already picked it up and has started the medication.

## 2021-02-25 ENCOUNTER — Ambulatory Visit: Payer: Managed Care, Other (non HMO) | Admitting: Dermatology

## 2021-03-07 ENCOUNTER — Other Ambulatory Visit: Payer: Self-pay | Admitting: Family Medicine

## 2021-03-07 NOTE — Telephone Encounter (Signed)
Requested Prescriptions  Pending Prescriptions Disp Refills  . losartan (COZAAR) 50 MG tablet [Pharmacy Med Name: LOSARTAN 50MG  TABLETS] 90 tablet     Sig: TAKE 1 TABLET(50 MG) BY MOUTH DAILY     Cardiovascular:  Angiotensin Receptor Blockers Failed - 03/07/2021 12:51 PM      Failed - Cr in normal range and within 180 days    Creatinine  Date Value Ref Range Status  11/08/2014 1.31 (H) 0.60 - 1.30 mg/dL Final   Creatinine, Ser  Date Value Ref Range Status  02/12/2021 1.30 (H) 0.76 - 1.27 mg/dL Final    Comment:                   **Effective February 15, 2021 [facility] will begin**                  reporting the 2021 CKD-EPI creatinine equation that                  estimates kidney function without a race variable.          Passed - K in normal range and within 180 days    Potassium  Date Value Ref Range Status  02/12/2021 3.7 3.5 - 5.2 mmol/L Final  11/08/2014 3.5 3.5 - 5.1 mmol/L Final         Passed - Patient is not pregnant      Passed - Last BP in normal range    BP Readings from Last 1 Encounters:  02/12/21 133/73         Passed - Valid encounter within last 6 months    Recent Outpatient Visits          3 weeks ago Diabetes mellitus due to underlying condition with diabetic neuropathy, without long-term current use of insulin ([place])   [place], Megan P, DO   5 months ago Routine general medical examination at a health care facility   [facility] [address], [place], DO   8 months ago Male hypogonadism   [place], Megan P, DO   9 months ago 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 hydrochlorothiazide (HYDRODIURIL) 25 MG tablet [Pharmacy Med Name: HYDROCHLOROTHIAZIDE 25MG  TABLETS] 90 tablet     Sig: TAKE 1 TABLET(25 MG) BY MOUTH DAILY     Cardiovascular: Diuretics - Thiazide Failed - 03/07/2021 12:51 PM      Failed - Cr in normal range and within 360 days    Creatinine  Date Value Ref Range Status  11/08/2014 1.31 (H) 0.60 - 1.30 mg/dL Final   Creatinine, Ser  Date Value Ref Range Status  02/12/2021 1.30 (H) 0.76 - 1.27 mg/dL Final    Comment:                   **Effective February 15, 2021 [facility] will begin**                  reporting the 2021 CKD-EPI creatinine equation that  estimates kidney function without a race variable.          Passed - Ca in normal range and within 360 days    Calcium  Date Value Ref Range Status  02/12/2021 9.9 8.6 - 10.2 mg/dL Final   Calcium, Total  Date Value Ref Range Status  11/08/2014 9.0 8.5 - 10.1 mg/dL Final         Passed - K in normal range and within 360 days    Potassium  Date Value Ref Range Status  02/12/2021 3.7 3.5 - 5.2 mmol/L Final  11/08/2014 3.5 3.5 - 5.1 mmol/L Final         Passed - Na in normal range and within 360 days    Sodium  Date Value Ref Range Status  02/12/2021 141 134 - 144 mmol/L Final  11/08/2014 138 136 - 145 mmol/L Final         Passed - Last BP in normal range    BP Readings from Last 1 Encounters:  02/12/21 133/73         Passed - Valid encounter within last 6 months    Recent Outpatient Visits          3 weeks ago Diabetes mellitus due to underlying condition with diabetic neuropathy, without long-term current use of insulin ([place])   [place], Megan P, DO   5 months ago Routine general medical examination at a health care facility   [facility], [place], DO   8 months ago Male hypogonadism   [place], Megan P, DO   9 months ago Encounter for [employer]  (commercial driving license) exam   [facility], Lupita Raider, FNP   12 months ago Diabetes mellitus due to underlying condition with diabetic neuropathy, without long-term current use of insulin [facility])   [place], [place], DO      Future Appointments            In 2 months Wynetta Emery, Barb Merino, DO [employer], [facility]

## 2021-04-16 ENCOUNTER — Other Ambulatory Visit: Payer: Self-pay

## 2021-04-16 ENCOUNTER — Ambulatory Visit
Admission: EM | Admit: 2021-04-16 | Discharge: 2021-04-16 | Disposition: A | Discharge status: Home / Self Care | Payer: Managed Care, Other (non HMO) | Attending: Sports Medicine | Admitting: Sports Medicine

## 2021-04-16 DIAGNOSIS — Z20822 Contact with and (suspected) exposure to covid-19: Secondary | ICD-10-CM | POA: Diagnosis not present

## 2021-04-16 DIAGNOSIS — N182 Chronic kidney disease, stage 2 (mild): Secondary | ICD-10-CM | POA: Diagnosis not present

## 2021-04-16 DIAGNOSIS — E1122 Type 2 diabetes mellitus with diabetic chronic kidney disease: Secondary | ICD-10-CM | POA: Insufficient documentation

## 2021-04-16 DIAGNOSIS — F17210 Nicotine dependence, cigarettes, uncomplicated: Secondary | ICD-10-CM | POA: Insufficient documentation

## 2021-04-16 DIAGNOSIS — Z79899 Other long term (current) drug therapy: Secondary | ICD-10-CM | POA: Diagnosis not present

## 2021-04-16 DIAGNOSIS — Z7901 Long term (current) use of anticoagulants: Secondary | ICD-10-CM | POA: Insufficient documentation

## 2021-04-16 DIAGNOSIS — Z28310 Unvaccinated for covid-19: Secondary | ICD-10-CM | POA: Diagnosis not present

## 2021-04-16 DIAGNOSIS — J302 Other seasonal allergic rhinitis: Secondary | ICD-10-CM

## 2021-04-16 DIAGNOSIS — I10 Essential (primary) hypertension: Secondary | ICD-10-CM

## 2021-04-16 DIAGNOSIS — Z8249 Family history of ischemic heart disease and other diseases of the circulatory system: Secondary | ICD-10-CM | POA: Diagnosis not present

## 2021-04-16 DIAGNOSIS — J069 Acute upper respiratory infection, unspecified: Secondary | ICD-10-CM

## 2021-04-16 DIAGNOSIS — I129 Hypertensive chronic kidney disease with stage 1 through stage 4 chronic kidney disease, or unspecified chronic kidney disease: Secondary | ICD-10-CM | POA: Diagnosis not present

## 2021-04-16 DIAGNOSIS — R0981 Nasal congestion: Secondary | ICD-10-CM | POA: Diagnosis not present

## 2021-04-16 DIAGNOSIS — Z7984 Long term (current) use of oral hypoglycemic drugs: Secondary | ICD-10-CM | POA: Insufficient documentation

## 2021-04-16 DIAGNOSIS — R059 Cough, unspecified: Secondary | ICD-10-CM | POA: Diagnosis not present

## 2021-04-16 DIAGNOSIS — R6883 Chills (without fever): Secondary | ICD-10-CM

## 2021-04-16 MED ORDER — BENZONATATE 100 MG PO CAPS: 200.0000 mg | ORAL_CAPSULE | Freq: Three times a day (TID) | ORAL | 0 refills | Status: DC | PRN

## 2021-04-16 MED ORDER — CETIRIZINE-PSEUDOEPHEDRINE ER 5-120 MG PO TB12: 1.0000 | ORAL_TABLET | Freq: Every day | ORAL | 0 refills | Status: DC

## 2021-04-16 MED ORDER — FLUTICASONE PROPIONATE 50 MCG/ACT NA SUSP: 1.0000 | Freq: Every day | NASAL | 0 refills | Status: DC

## 2021-04-16 NOTE — Discharge Instructions (Addendum)
Your COVID test was pending.  Someone will contact you with a positive result.  Please follow along in the app called MyChart if you want to look at your results.  You can download this onto your phone. As we discussed I do not believe that you have a bacterial process at the present time.  So no antibiotic is indicated at this time. I did prescribe 3 medications.  Please go to your pharmacy and pick them up.  Take them as directed. Educational handouts provided. Plenty of rest, plenty of fluids, Tylenol or Motrin for any fever or discomfort. If your symptoms persist then please see your primary care provider. If your symptoms worsen then please go to the emergency room.

## 2021-04-16 NOTE — ED Provider Notes (Signed)
[facility]    CSN: [id number] Arrival date & time: 04/16/21  1416      History   Chief Complaint Chief Complaint  Patient presents with  . Nasal Congestion  . Cough    HPI Manuel Burnett is a 65 y.o. male.   Patient is a pleasant 65 year old male who presents for evaluation of the above issues.  Normally sees Dr. Park Liter at [facility] for his ongoing medical care.  She was unavailable to see him today  He reports that he was out of town for several days and he was outside when the weather changed and feels as though that may have precipitated his symptoms.  He reports 3 days of cough, chills, nasal congestion.  He thought that he may have seasonal allergies but he has not taken any medications for this.  He does have a history of hypertension but has not taken his meds in 5 days.  He says that when he was away he left his medications at home.  He denies any fever shakes chills, nausea vomiting or diarrhea.  No abdominal or urinary symptoms.  He has not been vaccinated against COVID-19.  He is requesting to be tested.  He has had his flu shot.  He has been a smoker for a number of years and has a 50-pack-year history.  No diagnosis of COPD or asthma.  He denies chest pain or shortness of breath.  No red flag signs or symptoms elicited on history.     Past Medical History:  Diagnosis Date  . Cancer ([place])    SKIN  . Chronic kidney disease   . Diabetes mellitus without complication ([place])   . 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 Erectile dysfunction   . GERD (gastroesophageal reflux disease)   . Hyperlipidemia   . Hypertension   . Hypogonadism male   . Hypothyroidism   . Proteinuria   . Sleep apnea    CPAP  . Thyroid disease     Patient Active Problem List   Diagnosis Date Noted  . Encounter for commercial driving license (CDL) exam [id number]  .  Benign hypertensive renal disease 08/03/2015  . Hyperlipemia 08/03/2015  . Diabetes mellitus due to underlying condition with diabetic neuropathy ([place]) 08/03/2015  . Chronic kidney disease, stage II (mild) 08/03/2015  . Male hypogonadism 08/03/2015  . Hypothyroidism 08/03/2015  . Proteinuria 08/03/2015  . ED (erectile dysfunction) 08/03/2015  . OSA on CPAP 08/03/2015  . Secondary hyperparathyroidism ([place]) 08/03/2015  . Diverticulosis 08/03/2015  . Closed fracture of left scapula 11/09/2014  . Closed fracture of shaft of left clavicle 11/09/2014  . Multiple fractures of ribs of left side 11/09/2014    Past Surgical History:  Procedure Laterality Date  . CATARACT EXTRACTION W/PHACO Right 12/26/2017   Procedure: CATARACT EXTRACTION PHACO AND INTRAOCULAR LENS PLACEMENT (IOC);  Surgeon: Birder Robson, MD;  Location: [facility] ORS;  Service: Ophthalmology;  Laterality: Right;  [place] 00:21.8 AP% 21.1 CDE 2.64 FLUID PACK LOT # [id number]  . CATARACT EXTRACTION W/PHACO Left 01/09/2018   Procedure: CATARACT EXTRACTION PHACO AND INTRAOCULAR LENS PLACEMENT (IOC);  Surgeon: Birder Robson, MD;  Location: [facility] ORS;  Service: Ophthalmology;  Laterality: Left;  [place] 00:28.9 AP% 10.0 CDE 2.90 Fluid pcak lot # [id number]  . CLAVICLE SURGERY     internal fixation  . COLONOSCOPY WITH PROPOFOL N/A 04/06/2020   Procedure: COLONOSCOPY WITH PROPOFOL;  Surgeon: Lin Landsman, MD;  Location: [facility] ENDOSCOPY;  Service: Gastroenterology;  Laterality: N/A;  priority 4  . EYE SURGERY Bilateral 12/2017   Cataract  . FRACTURE SURGERY    . HEMORRHOID SURGERY    . TONSILLECTOMY    . TONSILLECTOMY AND ADENOIDECTOMY         Home Medications    Prior to Admission medications   Medication Sig Start Date End Date Taking? Authorizing Provider  albuterol (PROAIR HFA) 108 (90 Base) MCG/ACT inhaler Inhale 2 puffs into the lungs every 4 (four) hours as needed for wheezing or shortness of breath. 02/12/21  Yes Johnson,  Megan P, DO  amLODipine (NORVASC) 10 MG tablet Take 1 tablet (10 mg total) by mouth daily. 02/12/21  Yes Johnson, Megan P, DO  benzonatate (TESSALON) 100 MG capsule Take 2 capsules (200 mg total) by mouth 3 (three) times daily as needed for cough. 04/16/21  Yes Verda Cumins, MD  cetirizine-pseudoephedrine (ZYRTEC-D) 5-120 MG tablet Take 1 tablet by mouth daily. 04/16/21  Yes Verda Cumins, MD  ciclopirox (LOPROX) 0.77 % cream Apply topically 2 (two) times daily. 11/18/20  Yes Moye, [place], MD  clobetasol ointment (TEMOVATE) 0.05 % APPLY TO AFFECTED AREA(S) TWO TIMES A DAY 12/31/20  Yes Moye, [place], MD  empagliflozin (JARDIANCE) 25 MG TABS tablet Take 1 tablet (25 mg total) by mouth daily before breakfast. 01/19/21  Yes Johnson, Megan P, DO  fenofibrate 160 MG tablet Take 1 tablet (160 mg total) by mouth daily. with food 02/12/21  Yes Johnson, Megan P, DO  fluticasone (FLONASE) 50 MCG/ACT nasal spray Place 1 spray into both nostrils daily. 04/16/21  Yes Verda Cumins, MD  hydrochlorothiazide (HYDRODIURIL) 25 MG tablet Take 1 tablet (25 mg total) by mouth daily. 02/12/21  Yes Johnson, Megan P, DO  hydrocortisone 2.5 % cream Apply topically 2 (two) times daily as needed (Rash). Apply to face for up to 2 weeks 01/07/21  Yes Moye, [place], MD  levothyroxine (SYNTHROID) 200 MCG tablet TAKE 1 TABLET(200 MCG) BY MOUTH DAILY 10/01/20  Yes Johnson, Megan P, DO  losartan (COZAAR) 50 MG tablet Take 1 tablet (50 mg total) by mouth daily. 02/12/21  Yes Johnson, Megan P, DO  metFORMIN (GLUCOPHAGE-XR) 500 MG 24 hr tablet Take 2 tablets (1,000 mg total) by mouth in the morning and at bedtime. 02/16/21  Yes Johnson, Megan P, DO  Multiple Vitamin (MULTIVITAMIN WITH MINERALS) TABS tablet Take 1 tablet by mouth daily.   Yes [provider]  rosuvastatin (CRESTOR) 10 MG tablet TAKE 1 TABLET(10 MG) BY MOUTH DAILY 02/12/21  Yes Johnson, Megan P, DO  Semaglutide (RYBELSUS) 3 MG TABS Take 3 mg by mouth daily. For 30  days, then increase to 2 tabs daily for 30 days 02/12/21  Yes Johnson, Megan P, DO  sildenafil (VIAGRA) 100 MG tablet Take 1 tablet (100 mg total) by mouth daily as needed for erectile dysfunction. 02/12/21  Yes Johnson, Megan P, DO  testosterone cypionate (DEPOTESTOSTERONE CYPIONATE) 200 MG/ML injection ADMINISTER 0.5 ML IN THE MUSCLE EVERY 14 DAYS 11/03/20  Yes Johnson, Megan P, DO  triamcinolone (KENALOG) 0.1 % Apply 1 application topically 2 (two) times daily as needed (Rash). Apply to affected areas 01/07/21  Yes Moye, [place], MD  B-D INTEGRA SYRINGE 22G X 1-1/2" 3 ML MISC Inject testosterone 1x every 2 weeks 08/08/19   Johnson, Megan P, DO  BD HYPODERMIC NEEDLE 18G X 1" MISC INJECT TESTOSTERONE EVERY 2 WEEKS AS DIRECTED 12/24/20   Valerie Roys,  DO  glucose blood test strip Use as instructed 08/08/19   Park Liter P, DO  triamcinolone ointment (KENALOG) 0.5 % Apply 1 application topically 2 (two) times daily. 03/09/20   Valerie Roys, DO    Family History Family History  Problem Relation Age of Onset  . Hypertension Mother   . Heart disease Father   . Heart attack Father   . CAD Maternal Grandmother   . Cancer Maternal Grandfather     Social History Social History   Tobacco Use  . Smoking status: Current Every Day Smoker    Packs/day: 0.50    Types: Cigarettes  . Smokeless tobacco: Never Used  Vaping Use  . Vaping Use: Never used  Substance Use Topics  . Alcohol use: Not Currently    Alcohol/week: 0.0 standard drinks  . Drug use: No     Allergies   Codeine sulfate, Ozempic (0.25 or 0.5 mg-dose) [semaglutide(0.25 or 0.5mg -dos)], and Trulicity [dulaglutide]   Review of Systems Review of Systems  Constitutional: Positive for chills. Negative for activity change, appetite change, diaphoresis, fatigue and fever.  HENT: Positive for congestion and postnasal drip. Negative for ear pain, rhinorrhea, sinus pressure, sinus pain and sneezing.   Eyes: Negative.  Negative for  pain.  Respiratory: Positive for cough. Negative for chest tightness, shortness of breath, wheezing and stridor.   Cardiovascular: Negative.  Negative for chest pain, palpitations and leg swelling.  Gastrointestinal: Negative.  Negative for abdominal pain, constipation, diarrhea, nausea and vomiting.  Genitourinary: Negative.  Negative for dysuria.  Musculoskeletal: Negative.  Negative for arthralgias, back pain and myalgias.  Skin: Negative.  Negative for color change, pallor, rash and wound.  Allergic/Immunologic: Positive for environmental allergies.  Neurological: Negative for dizziness, weakness and light-headedness.     Physical Exam Triage Vital Signs ED Triage Vitals  Enc Vitals Group     BP 04/16/21 1449 (!) 183/88     Pulse Rate 04/16/21 1449 65     Resp 04/16/21 1449 18     Temp 04/16/21 1449 99.2 F (37.3 C)     Temp Source 04/16/21 1449 Oral     SpO2 04/16/21 1449 99 %     Weight 04/16/21 1446 215 lb (97.5 kg)     Height 04/16/21 1446 6\' 1"  (1.854 m)     Head Circumference --      Peak Flow --      Pain Score 04/16/21 1445 0     Pain Loc --      Pain Edu? --      Excl. in [place]? --    No data found.  Updated Vital Signs BP (!) 183/88 (BP Location: Left Arm)   Pulse 65   Temp 99.2 F (37.3 C) (Oral)   Resp 18   Ht 6\' 1"  (1.854 m)   Wt 97.5 kg   SpO2 99%   BMI 28.37 kg/m   Visual Acuity Right Eye Distance:   Left Eye Distance:   Bilateral Distance:    Right Eye Near:   Left Eye Near:    Bilateral Near:     Physical Exam Vitals and nursing note reviewed.  Constitutional:      General: He is not in acute distress.    Appearance: Normal appearance. He is not ill-appearing, toxic-appearing or diaphoretic.  HENT:     Head: Normocephalic and atraumatic.     Right Ear: Tympanic membrane normal.     Left Ear: Tympanic membrane normal.     Nose:  Congestion present. No rhinorrhea.     Right Turbinates: Swollen.     Left Turbinates: Swollen.     Right  Sinus: No maxillary sinus tenderness or frontal sinus tenderness.     Left Sinus: No maxillary sinus tenderness or frontal sinus tenderness.     Mouth/Throat:     Mouth: Mucous membranes are moist.     Pharynx: No oropharyngeal exudate or posterior oropharyngeal erythema.  Eyes:     General: No scleral icterus.       Right eye: No discharge.        Left eye: No discharge.     Extraocular Movements: Extraocular movements intact.     Conjunctiva/sclera: Conjunctivae normal.     Pupils: Pupils are equal, round, and reactive to light.  Cardiovascular:     Rate and Rhythm: Normal rate and regular rhythm.     Pulses: Normal pulses.     Heart sounds: Normal heart sounds. No murmur heard. No friction rub. No gallop.   Pulmonary:     Effort: Pulmonary effort is normal. No respiratory distress.     Breath sounds: Normal breath sounds. No stridor. No wheezing, rhonchi or rales.  Musculoskeletal:     Cervical back: Normal range of motion and neck supple. No rigidity or tenderness.  Lymphadenopathy:     Cervical: Cervical adenopathy present.  Skin:    General: Skin is warm and dry.     Capillary Refill: Capillary refill takes less than 2 seconds.     Findings: No bruising, erythema, lesion or rash.  Neurological:     General: No focal deficit present.     Mental Status: He is alert and oriented to person, place, and time.      UC Treatments / Results  Labs (all labs ordered are listed, but only abnormal results are displayed) Labs Reviewed  SARS CORONAVIRUS 2 (TAT 6-24 HRS)    EKG   Radiology No results found.  Procedures Procedures (including critical care time)  Medications Ordered in UC Medications - No data to display  Initial Impression / Assessment and Plan / UC Course  I have reviewed the triage vital signs and the nursing notes.  Pertinent labs & imaging results that were available during my care of the patient were reviewed by me and considered in my medical  decision making (see chart for details).   Clinical impression: 1.  3 days of cough, chills, and nasal congestion.  Examination is reassuring.  Consistent with a viral URI. 2.  Seasonal allergies not on any medications. 3.  History of hypertension not taking any meds for 5 days with elevation in his blood pressure here in the office.  It is currently uncontrolled.  Treatment plan: 1.  The findings and treatment plan were discussed in detail with the patient.  Patient was in agreement. 2.  We will go ahead and test him for COVID.  It was pending on discharge.  If it is positive someone will contact him.  I asked him to follow along in MyChart so that he can monitor for results.  He voiced verbal understanding. 3.  He did ask for an antibiotic but I do not believe this is a bacterial process at the present time.  He is only been symptomatic for 3 days.  No antibiotic was dispensed. 4.  I did prescribe Flonase, Tessalon Perles, and Zyrtec.  I believe the Zyrtec will help with his seasonal allergies at least until the pollen count goes down. 5.  Educational handouts were provided. 6.  Regarding his hypertension, I have encouraged him to go home and start his medications again keep his blood pressure down.  If he is having any problems with ideation to see his primary care provider.  I did review his chart in detail, he is on 3 antihypertensive medications.  In addition he is a diabetic and is on several medications for that.  I reemphasized the importance of medication management and compliance.  He voiced verbal understanding. 7.  Plenty of rest, plenty of fluids, Tylenol or Motrin for any fever or discomfort. 8.  If symptoms persist advised him to see his primary care provider.  If they worsen he should go to the emergency room. 9.  He was discharged in stable condition.  He will follow-up with [place] as needed.  Greater than 45 minutes was spent with the patient, reviewing his medical record including  prior visits, his medication list, obtaining history, doing a thorough physical exam, outlining his review of systems, making decision making process on 3 separate problems, prescribing medications, coming up with a treatment plan, counseling the patient and encouraging all questions.  They were answered to his satisfaction.    Final Clinical Impressions(s) / UC Diagnoses   Final diagnoses:  Upper respiratory tract infection, unspecified type  Cough  Nasal congestion  Chills  Seasonal allergies  Essential hypertension     Discharge Instructions     Your COVID test was pending.  Someone will contact you with a positive result.  Please follow along in the app called MyChart if you want to look at your results.  You can download this onto your phone. As we discussed I do not believe that you have a bacterial process at the present time.  So no antibiotic is indicated at this time. I did prescribe 3 medications.  Please go to your pharmacy and pick them up.  Take them as directed. Educational handouts provided. Plenty of rest, plenty of fluids, Tylenol or Motrin for any fever or discomfort. If your symptoms persist then please see your primary care provider. If your symptoms worsen then please go to the emergency room.    ED Prescriptions    Medication Sig Dispense Auth. Provider   fluticasone (FLONASE) 50 MCG/ACT nasal spray Place 1 spray into both nostrils daily. 15.8 mL Verda Cumins, MD   benzonatate (TESSALON) 100 MG capsule Take 2 capsules (200 mg total) by mouth 3 (three) times daily as needed for cough. 30 capsule Verda Cumins, MD   cetirizine-pseudoephedrine (ZYRTEC-D) 5-120 MG tablet Take 1 tablet by mouth daily. 30 tablet Verda Cumins, MD     PDMP not reviewed this encounter.   Verda Cumins, MD 04/19/21 1159

## 2021-04-16 NOTE — ED Triage Notes (Addendum)
Pt c/o cough, nasal congestion and chills since Tuesday. Pt thinks it may be allergies. Pt reports he does get bronchitis several times a year. Pt denies f/n/v/d or other symptoms.

## 2021-04-17 LAB — SARS CORONAVIRUS 2 (TAT 6-24 HRS): SARS Coronavirus 2: NEGATIVE

## 2021-04-18 ENCOUNTER — Other Ambulatory Visit: Payer: Self-pay | Admitting: Family Medicine

## 2021-04-19 ENCOUNTER — Telehealth: Payer: Self-pay | Admitting: Family Medicine

## 2021-04-19 ENCOUNTER — Other Ambulatory Visit: Payer: Self-pay

## 2021-04-19 ENCOUNTER — Other Ambulatory Visit: Payer: Self-pay | Admitting: Nurse Practitioner

## 2021-04-19 MED ORDER — RYBELSUS 3 MG PO TABS: 6.0000 mg | ORAL_TABLET | Freq: Every day | ORAL | 2 refills | Status: DC

## 2021-04-19 NOTE — Telephone Encounter (Signed)
Joe calling [facility] in [place] is calling regarding clarity on script Semaglutide (RYBELSUS) 3 MG TABS [[id number]] sig states Take 6 mg by mouth daily. For 30 days, then increase to 2 tabs daily for 30 days [phone]

## 2021-04-19 NOTE — Telephone Encounter (Signed)
Please advise 

## 2021-04-19 NOTE — Telephone Encounter (Signed)
Pharmacy is requesting 90 day supply of medication.  Insurance only pays for 90 - please send new script for 90 days.

## 2021-04-19 NOTE — Telephone Encounter (Signed)
Fixed script to show his increase to 6 MG daily.

## 2021-04-20 ENCOUNTER — Other Ambulatory Visit: Payer: Self-pay | Admitting: Family Medicine

## 2021-04-20 NOTE — Telephone Encounter (Signed)
Requested medications are due for refill today.  yes  Requested medications are on the active medications list.  Yes  Last refill. 11/02/2020  Future visit scheduled.   yes  Notes to clinic.  No protocol.

## 2021-04-21 NOTE — Telephone Encounter (Signed)
Pt scheduled 5/25

## 2021-05-03 NOTE — Telephone Encounter (Signed)
Can we see if he has enough to get to his appointment?

## 2021-05-03 NOTE — Telephone Encounter (Signed)
Called and spoke to patient. He states he needs a refill, he is completely out.

## 2021-05-04 ENCOUNTER — Telehealth: Payer: Self-pay

## 2021-05-04 NOTE — Telephone Encounter (Signed)
PA for testosterone cypionate 200mg / ml IM solution began.  Key: [id number] Waiting on response.

## 2021-05-05 NOTE — Telephone Encounter (Signed)
PA for testosterone injection denied. Waiting on fax to determine alternative or appeal.

## 2021-05-12 ENCOUNTER — Encounter: Payer: Self-pay | Admitting: Family Medicine

## 2021-05-12 ENCOUNTER — Ambulatory Visit: Payer: Managed Care, Other (non HMO) | Admitting: Family Medicine

## 2021-05-12 ENCOUNTER — Other Ambulatory Visit: Payer: Self-pay

## 2021-05-12 VITALS — BP 146/80 | HR 60 | Temp 98.2°F | Wt 218.6 lb

## 2021-05-12 DIAGNOSIS — E291 Testicular hypofunction: Secondary | ICD-10-CM

## 2021-05-12 DIAGNOSIS — E0840 Diabetes mellitus due to underlying condition with diabetic neuropathy, unspecified: Secondary | ICD-10-CM | POA: Diagnosis not present

## 2021-05-12 LAB — BAYER DCA HB A1C WAIVED: HB A1C (BAYER DCA - WAIVED): 9.1 % — ABNORMAL HIGH (ref <7.0)

## 2021-05-12 MED ORDER — RYBELSUS 7 MG PO TABS: 7.0000 mg | ORAL_TABLET | Freq: Every day | ORAL | 3 refills | Status: DC

## 2021-05-12 NOTE — Assessment & Plan Note (Signed)
Not doing well with A1c of 9.1- we will increase his rybelsus to 7mg  and may need to increase it to 14. Tolerating it well. Continue to monitor. Call with any concerns.

## 2021-05-12 NOTE — Assessment & Plan Note (Signed)
Insurance is giving [place] trouble about continuing his testosterone shots. Rechecking labs today. Await results. Treat as needed.

## 2021-05-12 NOTE — Progress Notes (Signed)
BP (!) 146/80   Pulse 60   Temp 98.2 F (36.8 C)   Wt 218 lb 9.6 oz (99.2 kg)   SpO2 96%   BMI 28.84 kg/m    Subjective:    Patient ID: Manuel Burnett, male    DOB: February 23, 1956, 65 y.o.   MRN: [id number]  HPI: Manuel Burnett is a 65 y.o. male  Chief Complaint  Patient presents with  . Diabetes  . testosterone    Follow up    DIABETES Hypoglycemic episodes:no Polydipsia/polyuria: yes Visual disturbance: no Chest pain: no Paresthesias: no Glucose Monitoring: no  Accucheck frequency: occasionally Taking Insulin?: no Blood Pressure Monitoring: not checking Retinal Examination: Up to Date Foot Exam: Up to Date Diabetic Education: Completed Pneumovax: Up to Date Influenza: Up to Date Aspirin: yes  LOW TESTOSTERONE Duration: chronic Status: stable  Satisfied with current treatment:  yes Previous testosterone therapies: androgel, IM testosterone Medication side effects:  no Medication compliance: excellent compliance Decreased libido: no Fatigue: no Depressed mood: no Muscle weakness: no Erectile dysfunction: no  Relevant past medical, surgical, family and social history reviewed and updated as indicated. Interim medical history since our last visit reviewed. Allergies and medications reviewed and updated.  Review of Systems  Constitutional: Negative.   Respiratory: Negative.   Cardiovascular: Negative.   Gastrointestinal: Negative.   Musculoskeletal: Negative.   Neurological: Negative.   Psychiatric/Behavioral: Negative.     Per HPI unless specifically indicated above     Objective:    BP (!) 146/80   Pulse 60   Temp 98.2 F (36.8 C)   Wt 218 lb 9.6 oz (99.2 kg)   SpO2 96%   BMI 28.84 kg/m   Wt Readings from Last 3 Encounters:  05/12/21 218 lb 9.6 oz (99.2 kg)  04/16/21 215 lb (97.5 kg)  02/12/21 213 lb 6.4 oz (96.8 kg)    Physical Exam Vitals and nursing note reviewed.  Constitutional:      General: He is not in acute distress.     Appearance: Normal appearance. He is not ill-appearing, toxic-appearing or diaphoretic.  HENT:     Head: Normocephalic and atraumatic.     Right Ear: External ear normal.     Left Ear: External ear normal.     Nose: Nose normal.     Mouth/Throat:     Mouth: Mucous membranes are moist.     Pharynx: Oropharynx is clear.  Eyes:     General: No scleral icterus.       Right eye: No discharge.        Left eye: No discharge.     Extraocular Movements: Extraocular movements intact.     Conjunctiva/sclera: Conjunctivae normal.     Pupils: Pupils are equal, round, and reactive to light.  Cardiovascular:     Rate and Rhythm: Normal rate and regular rhythm.     Pulses: Normal pulses.     Heart sounds: Normal heart sounds. No murmur heard. No friction rub. No gallop.   Pulmonary:     Effort: Pulmonary effort is normal. No respiratory distress.     Breath sounds: Normal breath sounds. No stridor. No wheezing, rhonchi or rales.  Chest:     Chest wall: No tenderness.  Musculoskeletal:        General: Normal range of motion.     Cervical back: Normal range of motion and neck supple.  Skin:    General: Skin is warm and dry.     Capillary Refill: Capillary  refill takes less than 2 seconds.     Coloration: Skin is not jaundiced or pale.     Findings: No bruising, erythema, lesion or rash.  Neurological:     General: No focal deficit present.     Mental Status: He is alert and oriented to person, place, and time. Mental status is at baseline.  Psychiatric:        Mood and Affect: Mood normal.        Behavior: Behavior normal.        Thought Content: Thought content normal.        Judgment: Judgment normal.     Results for orders placed or performed during the hospital encounter of 04/16/21  SARS CORONAVIRUS 2 (TAT 6-24 HRS) Nasopharyngeal Nasopharyngeal Swab   Specimen: Nasopharyngeal Swab  Result Value Ref Range   SARS Coronavirus 2 NEGATIVE NEGATIVE      Assessment & Plan:    Problem List Items Addressed This Visit      Endocrine   Diabetes mellitus due to underlying condition with diabetic neuropathy ([place]) - Primary    Not doing well with A1c of 9.1- we will increase his rybelsus to 7mg  and may need to increase it to 14. Tolerating it well. Continue to monitor. Call with any concerns.       Relevant Medications   Semaglutide (RYBELSUS) 7 MG TABS   Other Relevant Orders   Bayer DCA Hb A1c Waived   Male hypogonadism    Insurance is giving [place] trouble about continuing his testosterone shots. Rechecking labs today. Await results. Treat as needed.       Relevant Orders   Testosterone, free, total([facility])       Follow up plan: Return in about 4 weeks (around 06/09/2021).

## 2021-05-14 LAB — TESTOSTERONE, FREE, TOTAL, SHBG
Sex Hormone Binding: 25.6 nmol/L (ref 19.3–76.4)
Testosterone, Free: 6.9 pg/mL (ref 6.6–18.1)
Testosterone: 272 ng/dL (ref 264–916)

## 2021-05-18 ENCOUNTER — Telehealth: Payer: Self-pay

## 2021-05-18 NOTE — Telephone Encounter (Signed)
PA submitted via cover my meds for testosterone cypionate 200/49ml. Key: [id number]

## 2021-05-24 ENCOUNTER — Ambulatory Visit: Payer: Managed Care, Other (non HMO) | Admitting: Family Medicine

## 2021-05-25 ENCOUNTER — Encounter: Payer: Self-pay | Admitting: *Deleted

## 2021-05-25 ENCOUNTER — Encounter: Payer: Self-pay | Admitting: Nurse Practitioner

## 2021-05-25 ENCOUNTER — Telehealth: Payer: Self-pay

## 2021-05-25 ENCOUNTER — Telehealth (INDEPENDENT_AMBULATORY_CARE_PROVIDER_SITE_OTHER): Payer: Managed Care, Other (non HMO) | Admitting: Nurse Practitioner

## 2021-05-25 ENCOUNTER — Other Ambulatory Visit: Payer: Self-pay

## 2021-05-25 ENCOUNTER — Ambulatory Visit: Payer: Self-pay

## 2021-05-25 VITALS — Temp 98.5°F | Wt 218.0 lb

## 2021-05-25 DIAGNOSIS — I129 Hypertensive chronic kidney disease with stage 1 through stage 4 chronic kidney disease, or unspecified chronic kidney disease: Secondary | ICD-10-CM | POA: Diagnosis not present

## 2021-05-25 DIAGNOSIS — M545 Low back pain, unspecified: Secondary | ICD-10-CM | POA: Diagnosis present

## 2021-05-25 DIAGNOSIS — U071 COVID-19: Secondary | ICD-10-CM | POA: Insufficient documentation

## 2021-05-25 DIAGNOSIS — Z8616 Personal history of COVID-19: Secondary | ICD-10-CM | POA: Insufficient documentation

## 2021-05-25 DIAGNOSIS — E1140 Type 2 diabetes mellitus with diabetic neuropathy, unspecified: Secondary | ICD-10-CM | POA: Insufficient documentation

## 2021-05-25 DIAGNOSIS — E039 Hypothyroidism, unspecified: Secondary | ICD-10-CM | POA: Insufficient documentation

## 2021-05-25 DIAGNOSIS — E1122 Type 2 diabetes mellitus with diabetic chronic kidney disease: Secondary | ICD-10-CM | POA: Diagnosis not present

## 2021-05-25 DIAGNOSIS — Z7984 Long term (current) use of oral hypoglycemic drugs: Secondary | ICD-10-CM | POA: Insufficient documentation

## 2021-05-25 DIAGNOSIS — M5441 Lumbago with sciatica, right side: Secondary | ICD-10-CM | POA: Diagnosis not present

## 2021-05-25 DIAGNOSIS — M5442 Lumbago with sciatica, left side: Secondary | ICD-10-CM | POA: Diagnosis not present

## 2021-05-25 DIAGNOSIS — Z85828 Personal history of other malignant neoplasm of skin: Secondary | ICD-10-CM | POA: Diagnosis not present

## 2021-05-25 DIAGNOSIS — N182 Chronic kidney disease, stage 2 (mild): Secondary | ICD-10-CM | POA: Diagnosis not present

## 2021-05-25 DIAGNOSIS — Z79899 Other long term (current) drug therapy: Secondary | ICD-10-CM | POA: Diagnosis not present

## 2021-05-25 DIAGNOSIS — F17210 Nicotine dependence, cigarettes, uncomplicated: Secondary | ICD-10-CM | POA: Diagnosis not present

## 2021-05-25 MED ORDER — MOLNUPIRAVIR EUA 200MG CAPSULE
4.0000 | ORAL_CAPSULE | Freq: Two times a day (BID) | ORAL | 0 refills | Status: AC
  Filled 2021-05-25: qty 40, 5d supply, fill #0

## 2021-05-25 NOTE — Progress Notes (Signed)
Temp 98.5 F (36.9 C) (Oral)   Wt 218 lb (98.9 kg)   BMI 28.76 kg/m    Subjective:    Patient ID: Manuel Burnett, male    DOB: 28-Oct-1956, 65 y.o.   MRN: [id number]  HPI: Manuel Burnett is a 65 y.o. male  Chief Complaint  Patient presents with  . Covid Positive    Pt states he tested positive yesterday afternoon, symptoms started as well. States he was feeling very fatigued, body aches, and a slight cough.    UPPER RESPIRATORY TRACT INFECTION Worst symptom: COVID + yesterday.  Symptoms started yesterday afternoon. Fever: 101 Cough: yes Shortness of breath: no Wheezing: no Chest pain: no Chest tightness: no Chest congestion: no Nasal congestion: no Runny nose: no Post nasal drip: no Sneezing: no Sore throat: no Swollen glands: yes Sinus pressure: yes Headache: yes (more lightheaded) Face pain: no Toothache: no Ear pain: no bilateral Ear pressure: no bilateral Eyes red/itching:no Eye drainage/crusting: no  Vomiting: no Rash: no Fatigue: yes Sick contacts: yes Strep contacts: no  Context: stable Recurrent sinusitis: no Relief with OTC cold/cough medications: no  Treatments attempted: none   Relevant past medical, surgical, family and social history reviewed and updated as indicated. Interim medical history since our last visit reviewed. Allergies and medications reviewed and updated.  Review of Systems  Constitutional: Positive for fatigue and fever.  HENT: Positive for sinus pressure. Negative for congestion, ear pain, postnasal drip, rhinorrhea, sinus pain, sneezing and sore throat.   Respiratory: Positive for cough. Negative for chest tightness, shortness of breath and wheezing.   Gastrointestinal: Negative for vomiting.  Skin: Negative for rash.  Neurological: Positive for headaches.    Per HPI unless specifically indicated above     Objective:    Temp 98.5 F (36.9 C) (Oral)   Wt 218 lb (98.9 kg)   BMI 28.76 kg/m   Wt Readings from Last  3 Encounters:  05/25/21 218 lb (98.9 kg)  05/12/21 218 lb 9.6 oz (99.2 kg)  04/16/21 215 lb (97.5 kg)    Physical Exam Vitals and nursing note reviewed.  Constitutional:      General: He is not in acute distress.    Appearance: He is not ill-appearing.  HENT:     Head: Normocephalic.     Right Ear: Hearing normal.     Left Ear: Hearing normal.     Nose: Nose normal.  Pulmonary:     Effort: Pulmonary effort is normal. No respiratory distress.  Neurological:     Mental Status: He is alert.  Psychiatric:        Mood and Affect: Mood normal.        Behavior: Behavior normal.        Thought Content: Thought content normal.        Judgment: Judgment normal.     Results for orders placed or performed in visit on 05/12/21  Bayer DCA Hb A1c Waived  Result Value Ref Range   HB A1C (BAYER DCA - WAIVED) 9.1 (H) <7.0 %  Testosterone, free, total([facility])  Result Value Ref Range   Testosterone 272 264 - 916 ng/dL   Testosterone, Free 6.9 6.6 - 18.1 pg/mL   Sex Hormone Binding 25.6 19.3 - 76.4 nmol/L      Assessment & Plan:   Problem List Items Addressed This Visit      Other   COVID-19 - Primary    Stable.  Molnupiravir sent to pharmacy. Discussed proper use, side  effects and benefits with patient. Reviewed signs and symptoms to seek higher level of care. Reviewed symptom managment.  Return to clinic or seek higher level of care if SOB or fever that can't be controlled with tylenol or motrin develop.  PCR test placed for patient to show his employer.      Relevant Medications   molnupiravir EUA 200 mg CAPS       Follow up plan: No follow-ups on file.    This visit was completed via MyChart due to the restrictions of the COVID-19 pandemic. All issues as above were discussed and addressed. Physical exam was done as above through visual confirmation on MyChart. If it was felt that the patient should be evaluated in the office, they were directed there. The patient  verbally consented to this visit. 1. Location of the patient: Home 2. Location of the provider: Provider's Home 3. Those involved with this call:  ? Provider: Jon Billings, NP ? CMA: Yvonna Alanis, CMA ? Front Desk/Registration: Jill Side 4. Time spent on call: 15 minutes with patient face to face via video conference. More than 50% of this time was spent in counseling and coordination of care. 20 minutes total spent in review of patient's record and preparation of their chart.

## 2021-05-25 NOTE — Telephone Encounter (Signed)
Schedule with Santiago Glad

## 2021-05-25 NOTE — Telephone Encounter (Signed)
Pt tested positive on home covid test last night. Pt had exposure from a student. He has been febrile to 101 last night and 99 this am. Pt c/o fatigue, dizziness and body aches. Pt stated his head feels "in a fog."  Occasional cough with small amount of phlegm.  Pt is unvaccinated for covid. Pt has multiple high risk co morbidities. Pt stated he is drinking fluids and his urine color is clear.  Care advice given to pt and pt verbalized understanding.  Will route to office for Dr Wynetta Emery to review. Looked ahead to see if any virtual visits available today or tomorrow. None were noted.   Reason for Disposition . [1] HIGH RISK for severe COVID complications (e.g., weak immune system, age > 28 years, obesity with BMI > 25, pregnant, chronic lung disease or other chronic medical condition) AND [2] COVID symptoms (e.g., cough, fever)  (Exceptions: Already seen by PCP and no new or worsening symptoms.)  Answer Assessment - Initial Assessment Questions 1. COVID-19 DIAGNOSIS: "Who made your COVID-19 diagnosis?" "Was it confirmed by a positive lab test or self-test?" If not diagnosed by a doctor (or NP/PA), ask "Are there lots of cases (community spread) where you live?" Note: See public health department website, if unsure.     Rapid test last night 2. COVID-19 EXPOSURE: "Was there any known exposure to COVID before the symptoms began?" CDC Definition of close contact: within 6 feet (2 meters) for a total of 15 minutes or more over a 24-hour period.      Possible exposure to a student 3. ONSET: "When did the COVID-19 symptoms start?"      Yesterday afternoon 4. WORST SYMPTOM: "What is your worst symptom?" (e.g., cough, fever, shortness of breath, muscle aches)     Fatigued feels like head is in fog 5. COUGH: "Do you have a cough?" If Yes, ask: "How bad is the cough?"       Yes occasional cough-productive  6. FEVER: "Do you have a fever?" If Yes, ask: "What is your temperature, how was it measured, and  when did it start?"     Yes- oral thermometer 101-at highest //99 this am 7. RESPIRATORY STATUS: "Describe your breathing?" (e.g., shortness of breath, wheezing, unable to speak)      no 8. BETTER-SAME-WORSE: "Are you getting better, staying the same or getting worse compared to yesterday?"  If getting worse, ask, "In what way?"     worse 9. HIGH RISK DISEASE: "Do you have any chronic medical problems?" (e.g., asthma, heart or lung disease, weak immune system, obesity, etc.)     HTN 10. VACCINE: "Have you had the COVID-19 vaccine?" If Yes, ask: "Which one, how many shots, when did you get it?"   no 11. BOOSTER: "Have you received your COVID-19 booster?" If Yes, ask: "Which one and when did you get it?"    no     13. OTHER SYMPTOMS: "Do you have any other symptoms?"  (e.g., chills, fatigue, headache, loss of smell or taste, muscle pain, sore throat)       Feels dizzy, very  fatigued, fever, body aches 14. O2 SATURATION MONITOR:  "Do you use an oxygen saturation monitor (pulse oximeter) at home?" If Yes, ask "What is your reading (oxygen level) today?" "What is your usual oxygen saturation reading?" (e.g., 95%)      n/a  Protocols used: CORONAVIRUS (COVID-19) DIAGNOSED OR SUSPECTED-A-AH

## 2021-05-25 NOTE — Telephone Encounter (Signed)
Called pt to collect copay no answer left vm

## 2021-05-25 NOTE — ED Triage Notes (Addendum)
Pt has lower back pain radiating into both legs.  Pt was seen today at [facility], took 2 tramadol without relief.  Pt alert  Pt had positive covid home test yesterday.

## 2021-05-25 NOTE — Assessment & Plan Note (Addendum)
Stable.  Molnupiravir sent to pharmacy. Discussed proper use, side effects and benefits with patient. Reviewed signs and symptoms to seek higher level of care. Reviewed symptom managment.  Return to clinic or seek higher level of care if SOB or fever that can't be controlled with tylenol or motrin develop.  PCR test placed for patient to show his employer.

## 2021-05-25 NOTE — Telephone Encounter (Signed)
Yes please book with Santiago Glad

## 2021-05-25 NOTE — Telephone Encounter (Signed)
Please advise Santiago Glad has openings

## 2021-05-26 ENCOUNTER — Emergency Department
Admission: EM | Admit: 2021-05-26 | Discharge: 2021-05-26 | Disposition: A | Discharge status: Home / Self Care | Payer: Managed Care, Other (non HMO) | Attending: Emergency Medicine | Admitting: Emergency Medicine

## 2021-05-26 DIAGNOSIS — M5441 Lumbago with sciatica, right side: Secondary | ICD-10-CM

## 2021-05-26 LAB — NOVEL CORONAVIRUS, NAA: SARS-CoV-2, NAA: DETECTED — AB

## 2021-05-26 LAB — SARS-COV-2, NAA 2 DAY TAT

## 2021-05-26 MED ORDER — LIDOCAINE 5 % EX PTCH: 1.0000 | MEDICATED_PATCH | Freq: Two times a day (BID) | CUTANEOUS | 0 refills | Status: AC

## 2021-05-26 MED ORDER — CYCLOBENZAPRINE HCL 10 MG PO TABS: 10.0000 mg | ORAL_TABLET | Freq: Three times a day (TID) | ORAL | 0 refills | Status: DC | PRN

## 2021-05-26 MED ORDER — KETOROLAC TROMETHAMINE 60 MG/2ML IM SOLN
60.0000 mg | Freq: Once | INTRAMUSCULAR | Status: AC
  Administered 2021-05-26: 60 mg via INTRAMUSCULAR
  Filled 2021-05-26: qty 2

## 2021-05-26 MED ORDER — GABAPENTIN 300 MG PO CAPS: 300.0000 mg | ORAL_CAPSULE | Freq: Three times a day (TID) | ORAL | 2 refills | Status: DC

## 2021-05-26 MED ORDER — GABAPENTIN 600 MG PO TABS
300.0000 mg | ORAL_TABLET | Freq: Once | ORAL | Status: AC
  Administered 2021-05-26: 300 mg via ORAL
  Filled 2021-05-26: qty 1

## 2021-05-26 MED ORDER — LIDOCAINE 5 % EX PTCH
1.0000 | MEDICATED_PATCH | CUTANEOUS | Status: DC
  Administered 2021-05-26: 1 via TRANSDERMAL
  Filled 2021-05-26: qty 1

## 2021-05-26 MED ORDER — CYCLOBENZAPRINE HCL 10 MG PO TABS
10.0000 mg | ORAL_TABLET | Freq: Once | ORAL | Status: AC
  Administered 2021-05-26: 10 mg via ORAL
  Filled 2021-05-26: qty 1

## 2021-05-26 NOTE — Discharge Instructions (Addendum)
You have been seen in the Emergency Department (ED)  today for back pain.  Back pain has many possible causes some are related to muscles while others have more serious causes. Even though you were checked carefully today and your exam and evaluation were reassuring, problems may develop later or continue to unfold. Therefore it is imperative that you follow up with doctor closely for further evaluation.  Follow-up with your doctor in 1 day for further evaluation.  For pain control take medications as prescribed  When should you call for help?  Call your doctor now or seek immediate medical care if:  You have new or worsening numbness in your legs.  You have new or worsening weakness in your legs. (This could make it hard to stand up.)  You lose control of your bladder or bowels or if you are unable to urinate. You have numbness of your groin or buttock region If you develop a fever  Watch closely for changes in your health, and be sure to contact your doctor if:  Your pain gets worse.  You are not getting better after 2 weeks.  How can you care for yourself at home?  Take pain medicines exactly as directed.  If the doctor gave you a prescription medicine for pain, take it as prescribed.  If you are not taking a prescription pain medicine, ask your doctor if you can take an over-the-counter medicine like tylenol or ibuprofen. Sit or lie in positions that are most comfortable and reduce your pain. Try one of these positions when you lie down:  Lie on your back with your knees bent and supported by large pillows.  Lie on the floor with your legs on the seat of a sofa or chair.  Lie on your side with your knees and hips bent and a pillow between your legs.  Lie on your stomach if it does not make pain worse. Do not sit up in bed, and avoid soft couches and twisted positions. Bed rest can help relieve pain at first, but it delays healing. Avoid bed rest after the first day of back pain.   Change positions every 30 minutes. If you must sit for long periods of time, take breaks from sitting. Get up and walk around, or lie in a comfortable position.  Try using a heating pad on a low or medium setting for 15 to 20 minutes every 2 or 3 hours. Try a warm shower in place of one session with the heating pad.  You can also try an ice pack for 10 to 15 minutes every 2 to 3 hours. Put a thin cloth between the ice pack and your skin.  Take short walks several times a day. You can start with 5 to 10 minutes, 3 or 4 times a day, and work up to longer walks. Walk on level surfaces and avoid hills and stairs until your back is better.  Return to work and other activities as soon as you can. Continued rest without activity is usually not good for your back.  To prevent future back pain, do exercises to stretch and strengthen your back and stomach. Learn how to use good posture, safe lifting techniques, and proper body mechanics.

## 2021-05-26 NOTE — Progress Notes (Signed)
[place].  As suspected, your COVID PCR is positive.  Please let me know if you have any questions.

## 2021-05-26 NOTE — ED Provider Notes (Signed)
[facility] Emergency Department Provider Note  ____________________________________________  Time seen: Approximately 1:17 AM  I have reviewed the triage vital signs and the nursing notes.   HISTORY  Chief Complaint Back Pain and Covid Exposure   HPI Manuel Burnett is a 65 y.o. male with a history of diabetes, hypertension, hyperlipidemia, OSA, chronic back pain who presents for evaluation of exacerbation of his back pain.  Patient reports that he has had similar pain for over 20 years.  He reports having flareups of this pain.  This most recent flareup started 3 days ago.  Patient reports that he was riding his [place] with his wife over the weekend.  They went on a long ride with lots of twists and turns.  After coming back from the bike ride his back started to bother him.  Has been getting progressively worse ever since.  Is complaining of low midline pain that radiates down to bilateral buttocks and shoots down the back of both of his legs.  He went to [facility] yesterday and had an x-ray that did not show any acute pathology.  He was placed on tramadol.  Patient reports that this evening after taking 2 tramadol's he still could not sleep which is what prompted his visit to the emergency room.  He denies any trauma, fever, weight loss, history of cancer, IV drug use, saddle anesthesia, lower extremities weakness or numbness, urinary or bowel incontinence or retention.  He reports that the pain is identical to prior episodes that he has had for over 20 years.   Past Medical History:  Diagnosis Date  . Cancer ([place])    SKIN  . Chronic kidney disease   . Diabetes mellitus without complication ([place])   . 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 Erectile dysfunction   . GERD (gastroesophageal reflux disease)   . Hyperlipidemia   . Hypertension   . Hypogonadism male   .  Hypothyroidism   . Proteinuria   . Sleep apnea    CPAP  . Thyroid disease     Patient Active Problem List   Diagnosis Date Noted  . COVID-19 05/25/2021  . Encounter for commercial driving license (CDL) exam 20/25/4270  . Benign hypertensive renal disease 08/03/2015  . Hyperlipemia 08/03/2015  . Diabetes mellitus due to underlying condition with diabetic neuropathy ([place]) 08/03/2015  . Chronic kidney disease, stage II (mild) 08/03/2015  . Male hypogonadism 08/03/2015  . Hypothyroidism 08/03/2015  . Proteinuria 08/03/2015  . ED (erectile dysfunction) 08/03/2015  . OSA on CPAP 08/03/2015  . Secondary hyperparathyroidism ([place]) 08/03/2015  . Diverticulosis 08/03/2015  . Closed fracture of left scapula 11/09/2014  . Closed fracture of shaft of left clavicle 11/09/2014  . Multiple fractures of ribs of left side 11/09/2014    Past Surgical History:  Procedure Laterality Date  . CATARACT EXTRACTION W/PHACO Right 12/26/2017   Procedure: CATARACT EXTRACTION PHACO AND INTRAOCULAR LENS PLACEMENT (IOC);  Surgeon: Birder Robson, MD;  Location: [facility] ORS;  Service: Ophthalmology;  Laterality: Right;  [place] 00:21.8 AP% 21.1 CDE 2.64 FLUID PACK LOT # [id number]  . CATARACT EXTRACTION W/PHACO Left 01/09/2018   Procedure: CATARACT EXTRACTION PHACO AND INTRAOCULAR LENS PLACEMENT (IOC);  Surgeon: Birder Robson, MD;  Location: [facility] ORS;  Service: Ophthalmology;  Laterality: Left;  [place] 00:28.9 AP% 10.0 CDE 2.90 Fluid pcak lot # [id number]  . CLAVICLE SURGERY     internal fixation  .  COLONOSCOPY WITH PROPOFOL N/A 04/06/2020   Procedure: COLONOSCOPY WITH PROPOFOL;  Surgeon: Lin Landsman, MD;  Location: [facility] ENDOSCOPY;  Service: Gastroenterology;  Laterality: N/A;  priority 4  . EYE SURGERY Bilateral 12/2017   Cataract  . FRACTURE SURGERY    . HEMORRHOID SURGERY    . TONSILLECTOMY    . TONSILLECTOMY AND ADENOIDECTOMY      Prior to Admission medications   Medication Sig Start Date End Date  Taking? Authorizing Provider  cyclobenzaprine (FLEXERIL) 10 MG tablet Take 1 tablet (10 mg total) by mouth 3 (three) times daily as needed for muscle spasms. 05/26/21  Yes Alfred Levins, [place], MD  gabapentin (NEURONTIN) 300 MG capsule Take 1 capsule (300 mg total) by mouth 3 (three) times daily. 05/26/21 05/26/22 Yes Aidan Moten, [place], MD  lidocaine (LIDODERM) 5 % Place 1 patch onto the skin every 12 (twelve) hours. Remove & Discard patch within 12 hours or as directed by MD 05/26/21 05/26/22 Yes Alfred Levins, [place], MD  albuterol [facility] HFA) 108 [phone] Base) MCG/ACT inhaler Inhale 2 puffs into the lungs every 4 (four) hours as needed for wheezing or shortness of breath. 02/12/21   Wynetta Emery, Megan P, DO  amLODipine (NORVASC) 10 MG tablet Take 1 tablet (10 mg total) by mouth daily. 02/12/21   Johnson, Megan P, DO  B-D INTEGRA SYRINGE 22G X 1-1/2" 3 ML MISC Inject testosterone 1x every 2 weeks 08/08/19   Johnson, Megan P, DO  BD HYPODERMIC NEEDLE 18G X 1" MISC INJECT TESTOSTERONE EVERY 2 WEEKS AS DIRECTED 12/24/20   Johnson, Megan P, DO  cetirizine-pseudoephedrine (ZYRTEC-D) 5-120 MG tablet Take 1 tablet by mouth daily. 04/16/21   Verda Cumins, MD  ciclopirox (LOPROX) 0.77 % cream Apply topically 2 (two) times daily. 11/18/20   Moye, [place], MD  clobetasol ointment (TEMOVATE) 0.05 % APPLY TO AFFECTED AREA(S) TWO TIMES A DAY 12/31/20   Moye, [place], MD  empagliflozin (JARDIANCE) 25 MG TABS tablet Take 1 tablet (25 mg total) by mouth daily before breakfast. 01/19/21   Wynetta Emery, Megan P, DO  fenofibrate 160 MG tablet Take 1 tablet (160 mg total) by mouth daily. with food 02/12/21   Park Liter P, DO  fluticasone (FLONASE) 50 MCG/ACT nasal spray Place 1 spray into both nostrils daily. 04/16/21   Verda Cumins, MD  glucose blood test strip Use as instructed 08/08/19   Park Liter P, DO  hydrochlorothiazide (HYDRODIURIL) 25 MG tablet Take 1 tablet (25 mg total) by mouth daily. 02/12/21   Park Liter P, DO   hydrocortisone 2.5 % cream Apply topically 2 (two) times daily as needed (Rash). Apply to face for up to 2 weeks 01/07/21   Moye, [place], MD  levothyroxine (SYNTHROID) 200 MCG tablet TAKE 1 TABLET(200 MCG) BY MOUTH DAILY 10/01/20   Johnson, Megan P, DO  losartan (COZAAR) 50 MG tablet Take 1 tablet (50 mg total) by mouth daily. 02/12/21   Johnson, Megan P, DO  metFORMIN (GLUCOPHAGE-XR) 500 MG 24 hr tablet Take 2 tablets (1,000 mg total) by mouth in the morning and at bedtime. 02/16/21   Johnson, Megan P, DO  molnupiravir EUA 200 mg CAPS Take 4 capsules (800 mg total) by mouth 2 (two) times daily for 5 days. 05/25/21 05/30/21  Jon Billings, NP  Multiple Vitamin (MULTIVITAMIN WITH MINERALS) TABS tablet Take 1 tablet by mouth daily.    [provider]  rosuvastatin (CRESTOR) 10 MG tablet TAKE 1 TABLET(10 MG) BY MOUTH DAILY 02/12/21   Wynetta Emery, Megan P, DO  Semaglutide [place])  7 MG TABS Take 7 mg by mouth daily. 05/12/21   Johnson, Megan P, DO  sildenafil (VIAGRA) 100 MG tablet Take 1 tablet (100 mg total) by mouth daily as needed for erectile dysfunction. 02/12/21   Park Liter P, DO  testosterone cypionate (DEPOTESTOSTERONE CYPIONATE) 200 MG/ML injection ADMINISTER 0.5 ML IN THE MUSCLE EVERY 14 DAYS 05/03/21   Johnson, Megan P, DO  triamcinolone (KENALOG) 0.1 % Apply 1 application topically 2 (two) times daily as needed (Rash). Apply to affected areas 01/07/21   [facility], [place], MD  triamcinolone ointment (KENALOG) 0.5 % Apply 1 application topically 2 (two) times daily. 03/09/20   Park Liter P, DO    Allergies Codeine sulfate, Ozempic (0.25 or 0.5 mg-dose) [semaglutide(0.25 or 0.5mg -dos)], and Trulicity [dulaglutide]  Family History  Problem Relation Age of Onset  . Hypertension Mother   . Heart disease Father   . Heart attack Father   . CAD Maternal Grandmother   . Cancer Maternal Grandfather     Social History Social History   Tobacco Use  . Smoking status: Current Every  Day Smoker    Packs/day: 0.50    Types: Cigarettes  . Smokeless tobacco: Never Used  Vaping Use  . Vaping Use: Never used  Substance Use Topics  . Alcohol use: Not Currently    Alcohol/week: 0.0 standard drinks  . Drug use: No    Review of Systems  Constitutional: Negative for fever. Eyes: Negative for visual changes. ENT: Negative for sore throat. Neck: No neck pain  Cardiovascular: Negative for chest pain. Respiratory: Negative for shortness of breath. Gastrointestinal: Negative for abdominal pain, vomiting or diarrhea. Genitourinary: Negative for dysuria. Musculoskeletal: + back pain. Skin: Negative for rash. Neurological: Negative for headaches, weakness or numbness. Psych: No SI or HI  ____________________________________________   PHYSICAL EXAM:  VITAL SIGNS: ED Triage Vitals  Enc Vitals Group     BP 05/25/21 2229 (!) 168/86     Pulse Rate 05/25/21 2229 76     Resp 05/25/21 2229 20     Temp 05/25/21 2229 99.5 F (37.5 C)     Temp Source 05/25/21 2229 Oral     SpO2 05/25/21 2229 97 %     Weight 05/25/21 2225 218 lb (98.9 kg)     Height 05/25/21 2225 6\' 1"  (1.854 m)     Head Circumference --      Peak Flow --      Pain Score 05/25/21 2225 10     Pain Loc --      Pain Edu? --      Excl. in [place]? --     Constitutional: Alert and oriented. Well appearing and in no apparent distress. HEENT:      Head: Normocephalic and atraumatic.         Eyes: Conjunctivae are normal. Sclera is non-icteric.       Mouth/Throat: Mucous membranes are moist.       Neck: Supple with no signs of meningismus. Cardiovascular: Regular rate and rhythm. No murmurs, gallops, or rubs. 2+ symmetrical distal pulses are present in all extremities.  Respiratory: Normal respiratory effort. Lungs are clear to auscultation bilaterally.  Gastrointestinal: Soft, non tender, and non distended. Musculoskeletal: No midline CT and L-spine tenderness, bilateral paraspinal no tenderness, strong pulses  in all 4 extremities, no edema, cyanosis, or erythema of extremities.  Full painless range of motion of joints on bilateral legs. Neurologic: Normal speech and language. Face is symmetric.  1+ patellar DTR bilaterally, normal strength  and sensation, normal gait.   Skin: Skin is warm, dry and intact. No rash noted. Psychiatric: Mood and affect are normal. Speech and behavior are normal.  ____________________________________________   LABS (all labs ordered are listed, but only abnormal results are displayed)  Labs Reviewed - No data to display ____________________________________________  EKG  none  ____________________________________________  RADIOLOGY  none  ____________________________________________   PROCEDURES  Procedure(s) performed: None Procedures Critical Care performed:  None ____________________________________________   INITIAL IMPRESSION / ASSESSMENT AND PLAN / ED COURSE  65 y.o. male with a history of diabetes, hypertension, hyperlipidemia, OSA, chronic back pain who presents for evaluation of exacerbation of his back pain.  Patient has had this pain intermittently for over 20 years.  Exacerbated by a long trike ride over the weekend.     History overall reassuring with the absence of red flags including:  - Rapid progression of symptoms - Traumatic event, even minor trauma - Systemic symptoms including fevers, chills, weight loss - Recent bacterial infection - Numbness, weakness, difficulty walking, urinary retention or bowel incontinence - Personal history of cancer, immunosuppression, diabetes, known AAA, or renal colic - Personal history of IV drug use.    Exam overall reassuring with symmetric and intact lower extremity strength, sensation, and reflexes equal bilaterally without clonus, 2+ symmetric medial malleolar and dorsalis pedis pulses, no ttp over vertebral bodies, no pulsatile abdominal mass. XR from [place] available with patient and  reviewed by me with no acute abnormalities seen.  Based on history and physical, I have a very low suspicion of a concerning etiology of pain including epidural compression syndrome, spinal infection, transverse myelitis, malignancy, abdominal aortic aneurysm, renal colic, acute lower extremity claudication, neurogenic claudication, ankylosing spondylitis, or other intra-abdominal process.  Will give lidoderm patch, toradol IM, gabapentin, and flexeril.  Recommended close follow-up with [facility].  Discussed my standard return precautions for     _____________________________________________ Please note:  Patient was evaluated in Emergency Department today for the symptoms described in the history of present illness. Patient was evaluated in the context of the global COVID-19 pandemic, which necessitated consideration that the patient might be at risk for infection with the SARS-CoV-2 virus that causes COVID-19. Institutional protocols and algorithms that pertain to the evaluation of patients at risk for COVID-19 are in a state of rapid change based on information released by regulatory bodies including the CDC and federal and state organizations. These policies and algorithms were followed during the patient's care in the ED.  Some ED evaluations and interventions may be delayed as a result of limited staffing during the pandemic.   [place] Controlled Substance Database was reviewed by me. ____________________________________________   FINAL CLINICAL IMPRESSION(S) / ED DIAGNOSES   Final diagnoses:  Bilateral low back pain with bilateral sciatica, unspecified chronicity      NEW MEDICATIONS STARTED DURING THIS VISIT:  ED Discharge Orders         Ordered    gabapentin (NEURONTIN) 300 MG capsule  3 times daily        05/26/21 0116    cyclobenzaprine (FLEXERIL) 10 MG tablet  3 times daily PRN        05/26/21 0116    lidocaine (LIDODERM) 5 %  Every 12 hours        05/26/21 0116            Note:  This document was prepared using Dragon voice recognition software and may include unintentional dictation errors.    Alfred Levins, [place], MD 05/26/21  0123  

## 2021-06-14 ENCOUNTER — Other Ambulatory Visit: Payer: Self-pay

## 2021-06-14 ENCOUNTER — Encounter: Payer: Self-pay | Admitting: Family Medicine

## 2021-06-14 ENCOUNTER — Ambulatory Visit: Payer: Managed Care, Other (non HMO) | Admitting: Family Medicine

## 2021-06-14 VITALS — BP 128/72 | HR 54 | Temp 98.0°F | Wt 217.2 lb

## 2021-06-14 DIAGNOSIS — E0840 Diabetes mellitus due to underlying condition with diabetic neuropathy, unspecified: Secondary | ICD-10-CM

## 2021-06-14 MED ORDER — RYBELSUS 14 MG PO TABS: 14.0000 mg | ORAL_TABLET | Freq: Every day | ORAL | 1 refills | Status: DC

## 2021-06-14 MED ORDER — TESTOSTERONE CYPIONATE 200 MG/ML IM SOLN: 100.0000 mg | INTRAMUSCULAR | 5 refills | Status: DC

## 2021-06-14 NOTE — Progress Notes (Signed)
BP 128/72 (BP Location: Left Arm, Cuff Size: Normal)   Pulse (!) 54   Temp 98 F (36.7 C) (Oral)   Wt 217 lb 3.2 oz (98.5 kg)   SpO2 96%   BMI 28.66 kg/m    Subjective:    Patient ID: Manuel Burnett, male    DOB: 03/01/1956, 65 y.o.   MRN: [id number]  HPI: Manuel Burnett is a 65 y.o. male  Chief Complaint  Patient presents with   Diabetes    1 month f/up    Hypertension   DIABETES Hypoglycemic episodes:no Polydipsia/polyuria: no Visual disturbance: no Chest pain: no Paresthesias: no Glucose Monitoring: yes  Accucheck frequency: Daily  Fasting glucose: 170-200s Taking Insulin?: no Blood Pressure Monitoring: not checking Retinal Examination: Up to Date Foot Exam: Up to Date Diabetic Education: Completed Pneumovax: Up to Date Influenza: Up to Date Aspirin: yes  Relevant past medical, surgical, family and social history reviewed and updated as indicated. Interim medical history since our last visit reviewed. Allergies and medications reviewed and updated.  Review of Systems  Constitutional: Negative.   Respiratory: Negative.    Cardiovascular: Negative.   Gastrointestinal: Negative.   Musculoskeletal:  Positive for back pain. Negative for arthralgias, gait problem, joint swelling, myalgias, neck pain and neck stiffness.  Psychiatric/Behavioral: Negative.     Per HPI unless specifically indicated above     Objective:    BP 128/72 (BP Location: Left Arm, Cuff Size: Normal)   Pulse (!) 54   Temp 98 F (36.7 C) (Oral)   Wt 217 lb 3.2 oz (98.5 kg)   SpO2 96%   BMI 28.66 kg/m   Wt Readings from Last 3 Encounters:  06/14/21 217 lb 3.2 oz (98.5 kg)  05/25/21 218 lb (98.9 kg)  05/25/21 218 lb (98.9 kg)    Physical Exam Vitals and nursing note reviewed.  Constitutional:      General: He is not in acute distress.    Appearance: Normal appearance. He is not ill-appearing, toxic-appearing or diaphoretic.  HENT:     Head: Normocephalic and atraumatic.      Right Ear: External ear normal.     Left Ear: External ear normal.     Nose: Nose normal.     Mouth/Throat:     Mouth: Mucous membranes are moist.     Pharynx: Oropharynx is clear.  Eyes:     General: No scleral icterus.       Right eye: No discharge.        Left eye: No discharge.     Extraocular Movements: Extraocular movements intact.     Conjunctiva/sclera: Conjunctivae normal.     Pupils: Pupils are equal, round, and reactive to light.  Cardiovascular:     Rate and Rhythm: Normal rate and regular rhythm.     Pulses: Normal pulses.     Heart sounds: Normal heart sounds. No murmur heard.   No friction rub. No gallop.  Pulmonary:     Effort: Pulmonary effort is normal. No respiratory distress.     Breath sounds: Normal breath sounds. No stridor. No wheezing, rhonchi or rales.  Chest:     Chest wall: No tenderness.  Musculoskeletal:        General: Normal range of motion.     Cervical back: Normal range of motion and neck supple.  Skin:    General: Skin is warm and dry.     Capillary Refill: Capillary refill takes less than 2 seconds.  Coloration: Skin is not jaundiced or pale.     Findings: No bruising, erythema, lesion or rash.  Neurological:     General: No focal deficit present.     Mental Status: He is alert and oriented to person, place, and time. Mental status is at baseline.  Psychiatric:        Mood and Affect: Mood normal.        Behavior: Behavior normal.        Thought Content: Thought content normal.        Judgment: Judgment normal.    Results for orders placed or performed in visit on 05/25/21  Novel Coronavirus, NAA ([facility])   Specimen: Nasopharyngeal(NP) swabs in vial transport medium  Result Value Ref Range   SARS-CoV-2, NAA Detected (A) Not Detected  SARS-COV-2, NAA 2 DAY TAT  Result Value Ref Range   SARS-CoV-2, NAA 2 DAY TAT Performed       Assessment & Plan:   Problem List Items Addressed This Visit       Endocrine   Diabetes  mellitus due to underlying condition with diabetic neuropathy ([place]) - Primary   Relevant Medications   Semaglutide (RYBELSUS) 14 MG TABS   Other Relevant Orders   Basic metabolic panel     Follow up plan: Return in about 2 months (around 08/14/2021).

## 2021-06-15 LAB — BASIC METABOLIC PANEL
BUN/Creatinine Ratio: 14 (ref 10–24)
BUN: 14 mg/dL (ref 8–27)
CO2: 22 mmol/L (ref 20–29)
Calcium: 9.1 mg/dL (ref 8.6–10.2)
Chloride: 98 mmol/L (ref 96–106)
Creatinine, Ser: 0.99 mg/dL (ref 0.76–1.27)
Glucose: 155 mg/dL — ABNORMAL HIGH (ref 65–99)
Potassium: 3.3 mmol/L — ABNORMAL LOW (ref 3.5–5.2)
Sodium: 138 mmol/L (ref 134–144)
eGFR: 86 mL/min/{1.73_m2} (ref >59)

## 2021-06-23 ENCOUNTER — Encounter: Payer: Self-pay | Admitting: Family Medicine

## 2021-07-06 ENCOUNTER — Telehealth: Payer: Self-pay | Admitting: *Deleted

## 2021-07-06 NOTE — Telephone Encounter (Signed)
Pt brought in form from [place] to be completed by Dr Wynetta Emery Placed in [facility] for review

## 2021-07-07 NOTE — Telephone Encounter (Signed)
Placed form in provider signature folder.

## 2021-07-08 NOTE — Telephone Encounter (Signed)
Form is signed and complete, just missing patient signature and waist needs to be measured when he comes in to sign form. Form is in incomplete paperwork form.

## 2021-08-16 ENCOUNTER — Other Ambulatory Visit: Payer: Self-pay | Admitting: Family Medicine

## 2021-08-16 NOTE — Telephone Encounter (Signed)
Requested medication (s) are due for refill today: yes  Requested medication (s) are on the active medication list: yes   Last refill:  10/01/20  #90 2 refills   Future visit scheduled: yes tomorrow   Notes to clinic:  last labs 06/15/20 . Do you want to refill Rx for 90 day request prior to obtaining labs?      Requested Prescriptions  Pending Prescriptions Disp Refills   levothyroxine (SYNTHROID) 200 MCG tablet [Pharmacy Med Name: LEVOTHYROXINE 0.2MG (200MCG) TAB] 90 tablet 2    Sig: TAKE 1 TABLET(200 MCG) BY MOUTH DAILY     Endocrinology:  Hypothyroid Agents Failed - 08/16/2021  7:29 AM      Failed - TSH needs to be rechecked within 3 months after an abnormal result. Refill until TSH is due.      Failed - TSH in normal range and within 360 days    TSH  Date Value Ref Range Status  06/15/2020 1.590 0.450 - 4.500 uIU/mL Final          Passed - Valid encounter within last 12 months    Recent Outpatient Visits           2 months ago Diabetes mellitus due to underlying condition with diabetic neuropathy, without long-term current use of insulin ([place])   [facility], Megan P, DO   2 months ago [place], Santiago Glad, NP   3 months ago Diabetes mellitus due to underlying condition with diabetic neuropathy, without long-term current use of insulin ([place])   [facility], Megan P, DO   6 months ago Diabetes mellitus due to underlying condition with diabetic neuropathy, without long-term current use of insulin ([place])   [facility], Megan P, DO   11 months ago Routine general medical examination at a health care facility   [place], [place], DO       Future Appointments             Tomorrow Valerie Roys, DO [place], [facility]            Signed Prescriptions Disp Refills   metFORMIN (GLUCOPHAGE-XR) 500 MG 24 hr tablet 360 tablet 1    Sig: TAKE 2  TABLETS(1000 MG) BY MOUTH IN THE MORNING AND AT BEDTIME     Endocrinology:  Diabetes - Biguanides Failed - 08/16/2021  7:29 AM      Failed - HBA1C is between 0 and 7.9 and within 180 days    Hemoglobin A1C  Date Value Ref Range Status  07/09/2018 6.9  Final   HB A1C (BAYER DCA - WAIVED)  Date Value Ref Range Status  05/12/2021 9.1 (H) <7.0 % Final    Comment:                                          Diabetic Adult            <7.0                                       Healthy Adult        4.3 - 5.7                                                           (  DCCT/NGSP) American Diabetes Association's Summary of Glycemic Recommendations for Adults with Diabetes: Hemoglobin A1c <7.0%. More stringent glycemic goals (A1c <6.0%) may further reduce complications at the cost of increased risk of hypoglycemia.           Passed - Cr in normal range and within 360 days    Creatinine  Date Value Ref Range Status  11/08/2014 1.31 (H) 0.60 - 1.30 mg/dL Final   Creatinine, Ser  Date Value Ref Range Status  06/14/2021 0.99 0.76 - 1.27 mg/dL Final          Passed - eGFR in normal range and within 360 days    EGFR (African American)  Date Value Ref Range Status  11/08/2014 >60 >47m/min Final   GFR calc Af Amer  Date Value Ref Range Status  02/12/2021 67 >59 mL/min/1.73 Final    Comment:    **In accordance with recommendations from the NKF-ASN Task force,**   [facility] is in the process of updating its eGFR calculation to the   2021 CKD-EPI creatinine equation that estimates kidney function   without a race variable.    EGFR (Non-African Amer.)  Date Value Ref Range Status  11/08/2014 >60 >650mmin Final    Comment:    eGFR values <6036min/1.73 m2 may be an indication of chronic kidney disease (CKD). Calculated eGFR, using the MRDR Study equation, is useful in  patients with stable renal function. The eGFR calculation will not be reliable in acutely ill patients when serum  creatinine is changing rapidly. It is not useful in patients on dialysis. The eGFR calculation may not be applicable to patients at the low and high extremes of body sizes, pregnant women, and vegetarians.    GFR calc non Af Amer  Date Value Ref Range Status  02/12/2021 58 (L) >59 mL/min/1.73 Final   eGFR  Date Value Ref Range Status  06/14/2021 86 >59 mL/min/1.73 Final          Passed - Valid encounter within last 6 months    Recent Outpatient Visits           2 months ago Diabetes mellitus due to underlying condition with diabetic neuropathy, without long-term current use of insulin ([facility] [place] [facility], DO   2 months ago [facility]   3 months ago Diabetes mellitus due to underlying condition with diabetic neuropathy, without long-term current use of insulin ([facility]   6 months ago Diabetes mellitus due to underlying condition with diabetic neuropathy, without long-term current use of insulin ([facility] [place] [facility], [place]   11 months ago Routine general medical examination at a health care facility   [facility] MerinoO       Future Appointments             Tomorrow [place]erie Roys[place]

## 2021-08-16 NOTE — Telephone Encounter (Signed)
Future visit tomorrow

## 2021-08-17 ENCOUNTER — Encounter: Payer: Self-pay | Admitting: Family Medicine

## 2021-08-17 ENCOUNTER — Other Ambulatory Visit: Payer: Self-pay

## 2021-08-17 ENCOUNTER — Ambulatory Visit (INDEPENDENT_AMBULATORY_CARE_PROVIDER_SITE_OTHER): Payer: Managed Care, Other (non HMO) | Admitting: Family Medicine

## 2021-08-17 VITALS — BP 138/84 | HR 60 | Temp 98.4°F | Ht 73.03 in | Wt 214.4 lb

## 2021-08-17 DIAGNOSIS — E119 Type 2 diabetes mellitus without complications: Secondary | ICD-10-CM

## 2021-08-17 DIAGNOSIS — E0840 Diabetes mellitus due to underlying condition with diabetic neuropathy, unspecified: Secondary | ICD-10-CM | POA: Diagnosis not present

## 2021-08-17 LAB — BAYER DCA HB A1C WAIVED: HB A1C (BAYER DCA - WAIVED): 9.4 % — ABNORMAL HIGH (ref <7.0)

## 2021-08-17 MED ORDER — ALBUTEROL SULFATE HFA 108 (90 BASE) MCG/ACT IN AERS: 2.0000 | INHALATION_SPRAY | RESPIRATORY_TRACT | 1 refills | Status: DC | PRN

## 2021-08-17 MED ORDER — LEVOTHYROXINE SODIUM 200 MCG PO TABS: 200.0000 ug | ORAL_TABLET | Freq: Every day | ORAL | 0 refills | Status: DC

## 2021-08-17 MED ORDER — LOSARTAN POTASSIUM 50 MG PO TABS: 50.0000 mg | ORAL_TABLET | Freq: Every day | ORAL | 1 refills | Status: DC

## 2021-08-17 MED ORDER — ROSUVASTATIN CALCIUM 10 MG PO TABS: ORAL_TABLET | ORAL | 1 refills | Status: DC

## 2021-08-17 MED ORDER — FENOFIBRATE 160 MG PO TABS: 160.0000 mg | ORAL_TABLET | Freq: Every day | ORAL | 1 refills | Status: DC

## 2021-08-17 MED ORDER — AMLODIPINE BESYLATE 10 MG PO TABS: 10.0000 mg | ORAL_TABLET | Freq: Every day | ORAL | 1 refills | Status: DC

## 2021-08-17 MED ORDER — EMPAGLIFLOZIN 25 MG PO TABS: 25.0000 mg | ORAL_TABLET | Freq: Every day | ORAL | 1 refills | Status: DC

## 2021-08-17 MED ORDER — HYDROCHLOROTHIAZIDE 25 MG PO TABS: 25.0000 mg | ORAL_TABLET | Freq: Every day | ORAL | 1 refills | Status: DC

## 2021-08-17 NOTE — Assessment & Plan Note (Signed)
Not under good control with A1c of 9.4- has had steroids recently. Will really watch his diet and recheck 3 months. 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

## 2021-08-17 NOTE — Progress Notes (Signed)
BP 138/84   Pulse 60   Temp 98.4 F (36.9 C) (Oral)   Ht 6' 1.03" (1.855 m)   Wt 214 lb 6.4 oz (97.3 kg)   SpO2 97%   BMI 28.26 kg/m    Subjective:    Patient ID: Manuel Burnett, male    DOB: 1956-08-11, 65 y.o.   MRN: [id number]  HPI: Manuel Burnett is a 65 y.o. male  Chief Complaint  Patient presents with   Diabetes   DIABETES Hypoglycemic episodes:no Polydipsia/polyuria: yes Visual disturbance: no Chest pain: no Paresthesias: no Glucose Monitoring: no Taking Insulin?: no Blood Pressure Monitoring: not checking Retinal Examination: Up to Date Foot Exam: Up to Date Diabetic Education: Completed Pneumovax: Up to Date Influenza: Up to Date Aspirin: no  Relevant past medical, surgical, family and social history reviewed and updated as indicated. Interim medical history since our last visit reviewed. Allergies and medications reviewed and updated.  Review of Systems  Constitutional: Negative.   Respiratory: Negative.    Cardiovascular: Negative.   Gastrointestinal: Negative.   Psychiatric/Behavioral: Negative.     Per HPI unless specifically indicated above     Objective:    BP 138/84   Pulse 60   Temp 98.4 F (36.9 C) (Oral)   Ht 6' 1.03" (1.855 m)   Wt 214 lb 6.4 oz (97.3 kg)   SpO2 97%   BMI 28.26 kg/m   Wt Readings from Last 3 Encounters:  08/17/21 214 lb 6.4 oz (97.3 kg)  06/14/21 217 lb 3.2 oz (98.5 kg)  05/25/21 218 lb (98.9 kg)    Physical Exam Vitals and nursing note reviewed.  Constitutional:      General: He is not in acute distress.    Appearance: Normal appearance. He is not ill-appearing, toxic-appearing or diaphoretic.  HENT:     Head: Normocephalic and atraumatic.     Right Ear: External ear normal.     Left Ear: External ear normal.     Nose: Nose normal.     Mouth/Throat:     Mouth: Mucous membranes are moist.     Pharynx: Oropharynx is clear.  Eyes:     General: No scleral icterus.       Right eye: No discharge.         Left eye: No discharge.     Extraocular Movements: Extraocular movements intact.     Conjunctiva/sclera: Conjunctivae normal.     Pupils: Pupils are equal, round, and reactive to light.  Cardiovascular:     Rate and Rhythm: Normal rate and regular rhythm.     Pulses: Normal pulses.     Heart sounds: Normal heart sounds. No murmur heard.   No friction rub. No gallop.  Pulmonary:     Effort: Pulmonary effort is normal. No respiratory distress.     Breath sounds: Normal breath sounds. No stridor. No wheezing, rhonchi or rales.  Chest:     Chest wall: No tenderness.  Musculoskeletal:        General: Normal range of motion.     Cervical back: Normal range of motion and neck supple.  Skin:    General: Skin is warm and dry.     Capillary Refill: Capillary refill takes less than 2 seconds.     Coloration: Skin is not jaundiced or pale.     Findings: No bruising, erythema, lesion or rash.  Neurological:     General: No focal deficit present.     Mental Status: He is alert  and oriented to person, place, and time. Mental status is at baseline.  Psychiatric:        Mood and Affect: Mood normal.        Behavior: Behavior normal.        Thought Content: Thought content normal.        Judgment: Judgment normal.    Results for orders placed or performed in visit on 55/21/74  Basic metabolic panel  Result Value Ref Range   Glucose 155 (H) 65 - 99 mg/dL   BUN 14 8 - 27 mg/dL   Creatinine, Ser 0.99 0.76 - 1.27 mg/dL   eGFR 86 >59 mL/min/1.73   BUN/Creatinine Ratio 14 10 - 24   Sodium 138 134 - 144 mmol/L   Potassium 3.3 (L) 3.5 - 5.2 mmol/L   Chloride 98 96 - 106 mmol/L   CO2 22 20 - 29 mmol/L   Calcium 9.1 8.6 - 10.2 mg/dL      Assessment & Plan:   Problem List Items Addressed This Visit       Endocrine   Diabetes mellitus due to underlying condition with diabetic neuropathy ([place]) - Primary    Not under good control with A1c of 9.4- has had steroids recently. Will really  watch his diet and recheck 3 months. 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       Relevant Medications   rosuvastatin (CRESTOR) 10 MG tablet   losartan (COZAAR) 50 MG tablet   empagliflozin (JARDIANCE) 25 MG TABS tablet   Other Relevant Orders   Bayer DCA Hb A1c Waived     Follow up plan: Return in about 3 months (around 11/17/2021) for physical.

## 2021-11-10 ENCOUNTER — Other Ambulatory Visit: Payer: Self-pay

## 2021-11-10 ENCOUNTER — Encounter: Payer: Self-pay | Admitting: Family Medicine

## 2021-11-10 ENCOUNTER — Ambulatory Visit (INDEPENDENT_AMBULATORY_CARE_PROVIDER_SITE_OTHER): Payer: Managed Care, Other (non HMO) | Admitting: Family Medicine

## 2021-11-10 VITALS — BP 159/69 | HR 58 | Temp 98.1°F | Ht 73.5 in | Wt 218.4 lb

## 2021-11-10 DIAGNOSIS — I129 Hypertensive chronic kidney disease with stage 1 through stage 4 chronic kidney disease, or unspecified chronic kidney disease: Secondary | ICD-10-CM | POA: Diagnosis not present

## 2021-11-10 DIAGNOSIS — E0840 Diabetes mellitus due to underlying condition with diabetic neuropathy, unspecified: Secondary | ICD-10-CM | POA: Diagnosis not present

## 2021-11-10 DIAGNOSIS — E291 Testicular hypofunction: Secondary | ICD-10-CM

## 2021-11-10 DIAGNOSIS — E039 Hypothyroidism, unspecified: Secondary | ICD-10-CM

## 2021-11-10 DIAGNOSIS — N182 Chronic kidney disease, stage 2 (mild): Secondary | ICD-10-CM

## 2021-11-10 DIAGNOSIS — Z0000 Encounter for general adult medical examination without abnormal findings: Secondary | ICD-10-CM | POA: Diagnosis not present

## 2021-11-10 DIAGNOSIS — Z23 Encounter for immunization: Secondary | ICD-10-CM | POA: Diagnosis not present

## 2021-11-10 DIAGNOSIS — E782 Mixed hyperlipidemia: Secondary | ICD-10-CM

## 2021-11-10 LAB — MICROSCOPIC EXAMINATION
Bacteria, UA: NONE SEEN
Epithelial Cells (non renal): NONE SEEN /hpf (ref 0–10)
RBC, Urine: NONE SEEN /hpf (ref 0–2)
WBC, UA: NONE SEEN /hpf (ref 0–5)

## 2021-11-10 LAB — MICROALBUMIN, URINE WAIVED
Creatinine, Urine Waived: 100 mg/dL (ref 10–300)
Microalb, Ur Waived: 150 mg/L — ABNORMAL HIGH (ref 0–19)
Microalb/Creat Ratio: >300 mg/g — ABNORMAL HIGH (ref <30)

## 2021-11-10 LAB — URINALYSIS, ROUTINE W REFLEX MICROSCOPIC
Bilirubin, UA: NEGATIVE
Ketones, UA: NEGATIVE
Leukocytes,UA: NEGATIVE
Nitrite, UA: NEGATIVE
RBC, UA: NEGATIVE
Specific Gravity, UA: 1.02 (ref 1.005–1.030)
Urobilinogen, Ur: 0.2 mg/dL (ref 0.2–1.0)
pH, UA: 6.5 (ref 5.0–7.5)

## 2021-11-10 LAB — BAYER DCA HB A1C WAIVED: HB A1C (BAYER DCA - WAIVED): 8.6 % — ABNORMAL HIGH (ref 4.8–5.6)

## 2021-11-10 MED ORDER — HYDROCHLOROTHIAZIDE 25 MG PO TABS: 25.0000 mg | ORAL_TABLET | Freq: Every day | ORAL | 1 refills | Status: DC

## 2021-11-10 MED ORDER — RYBELSUS 14 MG PO TABS: 14.0000 mg | ORAL_TABLET | Freq: Every day | ORAL | 1 refills | Status: DC

## 2021-11-10 MED ORDER — SILDENAFIL CITRATE 100 MG PO TABS: 100.0000 mg | ORAL_TABLET | Freq: Every day | ORAL | 12 refills | Status: DC | PRN

## 2021-11-10 MED ORDER — LOSARTAN POTASSIUM 50 MG PO TABS: 50.0000 mg | ORAL_TABLET | Freq: Every day | ORAL | 1 refills | Status: DC

## 2021-11-10 MED ORDER — METFORMIN HCL ER 500 MG PO TB24: ORAL_TABLET | ORAL | 1 refills | Status: DC

## 2021-11-10 MED ORDER — FENOFIBRATE 160 MG PO TABS: 160.0000 mg | ORAL_TABLET | Freq: Every day | ORAL | 1 refills | Status: DC

## 2021-11-10 MED ORDER — ROSUVASTATIN CALCIUM 10 MG PO TABS: ORAL_TABLET | ORAL | 1 refills | Status: DC

## 2021-11-10 MED ORDER — EMPAGLIFLOZIN 25 MG PO TABS: 25.0000 mg | ORAL_TABLET | Freq: Every day | ORAL | 1 refills | Status: DC

## 2021-11-10 MED ORDER — ALBUTEROL SULFATE HFA 108 (90 BASE) MCG/ACT IN AERS: 2.0000 | INHALATION_SPRAY | RESPIRATORY_TRACT | 1 refills | Status: DC | PRN

## 2021-11-10 MED ORDER — AMLODIPINE BESYLATE 10 MG PO TABS: 10.0000 mg | ORAL_TABLET | Freq: Every day | ORAL | 1 refills | Status: DC

## 2021-11-10 NOTE — Assessment & Plan Note (Signed)
Improved with A1c of 8.6 down from over 9. Continue current regimen. Continue to monitor. Call with any concerns. Recheck 3 months.

## 2021-11-10 NOTE — Assessment & Plan Note (Signed)
Under good control on current regimen. Continue current regimen. Continue to monitor. Call with any concerns. Refills given. Labs drawn today.   

## 2021-11-10 NOTE — Progress Notes (Signed)
BP (!) 159/69   Pulse (!) 58   Temp 98.1 F (36.7 C)   Ht 6' 1.5" (1.867 m)   Wt 218 lb 6.4 oz (99.1 kg)   SpO2 96%   BMI 28.42 kg/m    Subjective:    Patient ID: Manuel Burnett, male    DOB: 1956/10/31, 65 y.o.   MRN: [id number]  HPI: Manuel Burnett is a 65 y.o. male presenting on 11/10/2021 for comprehensive medical examination. Current medical complaints include:  DIABETES Hypoglycemic episodes:no Polydipsia/polyuria: no Visual disturbance: no Chest pain: no Paresthesias: no Glucose Monitoring: no  Accucheck frequency: Not Checking Taking Insulin?: no Blood Pressure Monitoring: not checking Retinal Examination: Not up to Date Foot Exam: Up to Date Diabetic Education: Completed Pneumovax: Up to Date Influenza: Up to Date Aspirin: yes  HYPERTENSION / HYPERLIPIDEMIA- has been off his losartan for about 3 days Satisfied with current treatment? yes Duration of hypertension: chronic BP monitoring frequency: not checking BP medication side effects: no Past BP meds: losartan, amlodipine, hctz Duration of hyperlipidemia: chronic Cholesterol medication side effects: no Cholesterol supplements: none Past cholesterol medications: crestor Medication compliance: excellent compliance Aspirin: no Recent stressors: no Recurrent headaches: no Visual changes: no Palpitations: no Dyspnea: no Chest pain: no Lower extremity edema: no Dizzy/lightheaded: no  HYPOTHYROIDISM Thyroid control status:controlled Satisfied with current treatment? yes Medication side effects: no Medication compliance: excellent compliance Etiology of hypothyroidism:  Recent dose adjustment:no Fatigue: yes Cold intolerance: no Heat intolerance: no Weight gain: no Weight loss: no Constipation: no Diarrhea/loose stools: no Palpitations: no Lower extremity edema: no Anxiety/depressed mood: no  LOW TESTOSTERONE Duration: chronic Status: controlled  Satisfied with current treatment:   no Medication side effects:  no Medication compliance: poor compliance Decreased libido: no Fatigue: no Depressed mood: no Muscle weakness: no Erectile dysfunction: no  Interim Problems from his last visit: no  Depression Screen done today and results listed below:  Depression screen [facility] 2/9 11/10/2021 08/17/2021 09/14/2020 08/08/2019 07/09/2018  Decreased Interest 0 0 0 1 0  Down, Depressed, Hopeless 0 0 0 0 0  PHQ - 2 Score 0 0 0 1 0  Altered sleeping 0 0 0 - 0  Tired, decreased energy 3 0 0 - 0  Change in appetite 0 0 0 - 0  Feeling bad or failure about yourself  0 0 0 - 0  Trouble concentrating 0 0 0 - 0  Moving slowly or fidgety/restless 0 0 0 - 0  Suicidal thoughts 0 0 0 - 0  PHQ-9 Score 3 0 0 - 0  Difficult doing work/chores - - - - Not difficult at all    Past Medical History:  Past Medical History:  Diagnosis Date   Cancer ([place])    SKIN   Chronic kidney disease    Diabetes mellitus without complication ([place])    Dysplastic nevus 01/08/2021   right upper back biopsy proven compound dysplastic nevus with mild atypia margins involved    Erectile dysfunction    GERD (gastroesophageal reflux disease)    Hyperlipidemia    Hypertension    Hypogonadism male    Hypothyroidism    Proteinuria    Sleep apnea    CPAP   Thyroid disease     Surgical History:  Past Surgical History:  Procedure Laterality Date   CATARACT EXTRACTION W/PHACO Right 12/26/2017   Procedure: CATARACT EXTRACTION PHACO AND INTRAOCULAR LENS PLACEMENT (IOC);  Surgeon: Birder Robson, MD;  Location: [facility] ORS;  Service: Ophthalmology;  Laterality: Right;  [place] 00:21.8 AP% 21.1 CDE 2.64 FLUID PACK LOT # [id number]   CATARACT EXTRACTION W/PHACO Left 01/09/2018   Procedure: CATARACT EXTRACTION PHACO AND INTRAOCULAR LENS PLACEMENT (IOC);  Surgeon: Birder Robson, MD;  Location: [facility] ORS;  Service: Ophthalmology;  Laterality: Left;  [place] 00:28.9 AP% 10.0 CDE 2.90 Fluid pcak lot # [id number]   CLAVICLE  SURGERY     internal fixation   COLONOSCOPY WITH PROPOFOL N/A 04/06/2020   Procedure: COLONOSCOPY WITH PROPOFOL;  Surgeon: Lin Landsman, MD;  Location[facility] ENDOSCOPY;  Service: Gastroenterology;  Laterality: N/A;  priority 4   EYE SURGERY Bilateral 12/2017   Cataract   FRACTURE SURGERY     HEMORRHOID SURGERY     TONSILLECTOMY     TONSILLECTOMY AND ADENOIDECTOMY      Medications:  Current Outpatient Medications on File Prior to Visit  Medication Sig   B-D INTEGRA SYRINGE 22G X 1-1/2" 3 ML MISC Inject testosterone 1x every 2 weeks   BD HYPODERMIC NEEDLE 18G X 1" MISC INJECT TESTOSTERONE EVERY 2 WEEKS AS DIRECTED   glucose blood test strip Use as instructed   levothyroxine (SYNTHROID) 200 MCG tablet Take 1 tablet (200 mcg total) by mouth daily before breakfast.   lidocaine (LIDODERM) 5 % Place 1 patch onto the skin every 12 (twelve) hours. Remove & Discard patch within 12 hours or as directed by MD   Multiple Vitamin (MULTIVITAMIN WITH MINERALS) TABS tablet Take 1 tablet by mouth daily.   testosterone cypionate (DEPOTESTOSTERONE CYPIONATE) 200 MG/ML injection Inject 0.5 mLs (100 mg total) into the muscle every 14 (fourteen) days.   No current facility-administered medications on file prior to visit.    Allergies:  Allergies  Allergen Reactions   Codeine Sulfate Rash   Ozempic (0.25 Or 0.5 Mg-Dose) [Semaglutide(0.25 Or 0.5mg -Dos)] Rash   Trulicity [Dulaglutide] Rash    Social History:  Social History   Socioeconomic History   Marital status: Married    Spouse name: Not on file   Number of children: Not on file   Years of education: Not on file   Highest education level: Not on file  Occupational History   Not on file  Tobacco Use   Smoking status: Every Day    Packs/day: 0.50    Types: Cigarettes   Smokeless tobacco: Never  Vaping Use   Vaping Use: Never used  Substance and Sexual Activity   Alcohol use: Not Currently    Alcohol/week: 0.0 standard drinks    Drug use: No   Sexual activity: Not Currently  Other Topics Concern   Not on file  Social History Narrative   Not on file   Social Determinants of Health   Financial Resource Strain: Not on file  Food Insecurity: Not on file  Transportation Needs: Not on file  Physical Activity: Not on file  Stress: Not on file  Social Connections: Not on file  Intimate Partner Violence: Not on file   Social History   Tobacco Use  Smoking Status Every Day   Packs/day: 0.50   Types: Cigarettes  Smokeless Tobacco Never   Social History   Substance and Sexual Activity  Alcohol Use Not Currently   Alcohol/week: 0.0 standard drinks    Family History:  Family History  Problem Relation Age of Onset   Hypertension Mother    Heart disease Father    Heart attack Father    CAD Maternal Grandmother    Cancer Maternal Grandfather     Past medical history, surgical history, medications,  allergies, family history and social history reviewed with patient today and changes made to appropriate areas of the chart.   Review of Systems  Constitutional: Negative.   HENT: Negative.    Eyes: Negative.   Respiratory: Negative.    Cardiovascular: Negative.   Gastrointestinal: Negative.   Genitourinary: Negative.   Musculoskeletal:  Positive for joint pain. Negative for back pain, falls, myalgias and neck pain.  Skin: Negative.   Neurological: Negative.   Endo/Heme/Allergies:  Positive for polydipsia. Negative for environmental allergies. Does not bruise/bleed easily.  Psychiatric/Behavioral: Negative.    All other ROS negative except what is listed above and in the HPI.      Objective:    BP (!) 159/69   Pulse (!) 58   Temp 98.1 F (36.7 C)   Ht 6' 1.5" (1.867 m)   Wt 218 lb 6.4 oz (99.1 kg)   SpO2 96%   BMI 28.42 kg/m   Wt Readings from Last 3 Encounters:  11/10/21 218 lb 6.4 oz (99.1 kg)  08/17/21 214 lb 6.4 oz (97.3 kg)  06/14/21 217 lb 3.2 oz (98.5 kg)    Physical Exam Vitals  and nursing note reviewed.  Constitutional:      General: He is not in acute distress.    Appearance: Normal appearance. He is obese. He is not ill-appearing, toxic-appearing or diaphoretic.  HENT:     Head: Normocephalic and atraumatic.     Right Ear: Tympanic membrane, ear canal and external ear normal. There is no impacted cerumen.     Left Ear: Tympanic membrane, ear canal and external ear normal. There is no impacted cerumen.     Nose: Nose normal. No congestion or rhinorrhea.     Mouth/Throat:     Mouth: Mucous membranes are moist.     Pharynx: Oropharynx is clear. No oropharyngeal exudate or posterior oropharyngeal erythema.  Eyes:     General: No scleral icterus.       Right eye: No discharge.        Left eye: No discharge.     Extraocular Movements: Extraocular movements intact.     Conjunctiva/sclera: Conjunctivae normal.     Pupils: Pupils are equal, round, and reactive to light.  Neck:     Vascular: No carotid bruit.  Cardiovascular:     Rate and Rhythm: Normal rate and regular rhythm.     Pulses: Normal pulses.     Heart sounds: No murmur heard.   No friction rub. No gallop.  Pulmonary:     Effort: Pulmonary effort is normal. No respiratory distress.     Breath sounds: Normal breath sounds. No stridor. No wheezing, rhonchi or rales.  Chest:     Chest wall: No tenderness.  Abdominal:     General: Abdomen is flat. Bowel sounds are normal. There is no distension.     Palpations: Abdomen is soft. There is no mass.     Tenderness: There is no abdominal tenderness. There is no right CVA tenderness, left CVA tenderness, guarding or rebound.     Hernia: No hernia is present.  Genitourinary:    Comments: Genital exam deferred with shared decision making Musculoskeletal:        General: No swelling, tenderness, deformity or signs of injury.     Cervical back: Normal range of motion and neck supple. No rigidity. No muscular tenderness.     Right lower leg: No edema.      Left lower leg: No edema.  Lymphadenopathy:  Cervical: No cervical adenopathy.  Skin:    General: Skin is warm and dry.     Capillary Refill: Capillary refill takes less than 2 seconds.     Coloration: Skin is not jaundiced or pale.     Findings: No bruising, erythema, lesion or rash.  Neurological:     General: No focal deficit present.     Mental Status: He is alert and oriented to person, place, and time.     Cranial Nerves: No cranial nerve deficit.     Sensory: No sensory deficit.     Motor: No weakness.     Coordination: Coordination normal.     Gait: Gait normal.     Deep Tendon Reflexes: Reflexes normal.  Psychiatric:        Mood and Affect: Mood normal.        Behavior: Behavior normal.        Thought Content: Thought content normal.        Judgment: Judgment normal.    Results for orders placed or performed in visit on 08/17/21  Bayer DCA Hb A1c Waived  Result Value Ref Range   HB A1C (BAYER DCA - WAIVED) 9.4 (H) <7.0 %      Assessment & Plan:   Problem List Items Addressed This Visit       Endocrine   Diabetes mellitus due to underlying condition with diabetic neuropathy ([place])    Improved with A1c of 8.6 down from over 9. Continue current regimen. Continue to monitor. Call with any concerns. Recheck 3 months.       Relevant Medications   empagliflozin (JARDIANCE) 25 MG TABS tablet   losartan (COZAAR) 50 MG tablet   rosuvastatin (CRESTOR) 10 MG tablet   metFORMIN (GLUCOPHAGE-XR) 500 MG 24 hr tablet   Semaglutide (RYBELSUS) 14 MG TABS   Other Relevant Orders   Comprehensive metabolic panel   CBC with Differential/Platelet   Urinalysis, Routine w reflex microscopic   Microalbumin, Urine Waived   Bayer DCA Hb A1c Waived   Ambulatory referral to Ophthalmology   Male hypogonadism    Rechecking labs today. Await results. Treat as needed.       Relevant Orders   Comprehensive metabolic panel   CBC with Differential/Platelet   PSA   Testosterone,  free, total([facility])   Hypothyroidism    Rechecking labs today. Await results. Treat as needed.       Relevant Orders   Comprehensive metabolic panel   CBC with Differential/Platelet   TSH     Genitourinary   Benign hypertensive renal disease    Has not been taking his losartan. Restart and recheck 3 months. Call with any concerns. Labs drawn today. Refills given.       Relevant Orders   Comprehensive metabolic panel   CBC with Differential/Platelet   Microalbumin, Urine Waived   Chronic kidney disease, stage II (mild)    Rechecking labs today. Await results. Treat as needed.       Relevant Orders   Comprehensive metabolic panel   CBC with Differential/Platelet   Microalbumin, Urine Waived     Other   Hyperlipemia    Under good control on current regimen. Continue current regimen. Continue to monitor. Call with any concerns. Refills given. Labs drawn today.       Relevant Medications   amLODipine (NORVASC) 10 MG tablet   fenofibrate 160 MG tablet   hydrochlorothiazide (HYDRODIURIL) 25 MG tablet   losartan (COZAAR) 50 MG tablet   rosuvastatin (CRESTOR)  10 MG tablet   sildenafil (VIAGRA) 100 MG tablet   Other Relevant Orders   Comprehensive metabolic panel   CBC with Differential/Platelet   Lipid Panel w/o Chol/HDL Ratio   Other Visit Diagnoses     Routine general medical examination at a health care facility    -  Primary   Vaccines updated. Screening labs checked today. Colonoscopy up to date. Continue diet and exercise. Call with any concerns. Continue to monitor.    Need for influenza vaccination       Relevant Orders   Flu Vaccine QUAD 6+ mos PF IM (Fluarix Quad PF) (Completed)        Discussed aspirin prophylaxis for myocardial infarction prevention and decision was made to continue ASA  LABORATORY TESTING:  Health maintenance labs ordered today as discussed above.   The natural history of prostate cancer and ongoing controversy regarding  screening and potential treatment outcomes of prostate cancer has been discussed with the patient. The meaning of a false positive PSA and a false negative PSA has been discussed. He indicates understanding of the limitations of this screening test and wishes  to proceed with screening PSA testing.   IMMUNIZATIONS:   - Tdap: Tetanus vaccination status reviewed: last tetanus booster within 10 years. - Influenza: Administered today - Pneumovax: Up to date - Prevnar: Not applicable - COVID: Refused - HPV: Not applicable - Shingrix vaccine: Administered today  SCREENING: - Colonoscopy: Up to date  Discussed with patient purpose of the colonoscopy is to detect colon cancer at curable precancerous or early stages   PATIENT COUNSELING:    Sexuality: Discussed sexually transmitted diseases, partner selection, use of condoms, avoidance of unintended pregnancy  and contraceptive alternatives.   Advised to avoid cigarette smoking.  I discussed with the patient that most people either abstain from alcohol or drink within safe limits (<=14/week and <=4 drinks/occasion for males, <=7/weeks and <= 3 drinks/occasion for females) and that the risk for alcohol disorders and other health effects rises proportionally with the number of drinks per week and how often a drinker exceeds daily limits.  Discussed cessation/primary prevention of drug use and availability of treatment for abuse.   Diet: Encouraged to adjust caloric intake to maintain  or achieve ideal body weight, to reduce intake of dietary saturated fat and total fat, to limit sodium intake by avoiding high sodium foods and not adding table salt, and to maintain adequate dietary potassium and calcium preferably from fresh fruits, vegetables, and low-fat dairy products.    stressed the importance of regular exercise  Injury prevention: Discussed safety belts, safety helmets, smoke detector, smoking near bedding or upholstery.   Dental health:  Discussed importance of regular tooth brushing, flossing, and dental visits.   Follow up plan: NEXT PREVENTATIVE PHYSICAL DUE IN 1 YEAR. Return in about 3 months (around 02/10/2022).

## 2021-11-10 NOTE — Assessment & Plan Note (Signed)
Rechecking labs today. Await results. Treat as needed.  °

## 2021-11-10 NOTE — Assessment & Plan Note (Signed)
Has not been taking his losartan. Restart and recheck 3 months. Call with any concerns. Labs drawn today. Refills given.

## 2021-11-11 ENCOUNTER — Other Ambulatory Visit: Payer: Self-pay | Admitting: Family Medicine

## 2021-11-11 DIAGNOSIS — E039 Hypothyroidism, unspecified: Secondary | ICD-10-CM

## 2021-11-11 DIAGNOSIS — E291 Testicular hypofunction: Secondary | ICD-10-CM

## 2021-11-11 MED ORDER — LEVOTHYROXINE SODIUM 200 MCG PO TABS: 200.0000 ug | ORAL_TABLET | Freq: Every day | ORAL | 0 refills | Status: DC

## 2021-11-11 MED ORDER — LEVOTHYROXINE SODIUM 50 MCG PO TABS: 50.0000 ug | ORAL_TABLET | Freq: Every day | ORAL | 0 refills | Status: DC

## 2021-11-15 LAB — COMPREHENSIVE METABOLIC PANEL
ALT: 10 IU/L (ref 0–44)
AST: 11 IU/L (ref 0–40)
Albumin/Globulin Ratio: 1.8 (ref 1.2–2.2)
Albumin: 4.3 g/dL (ref 3.8–4.8)
Alkaline Phosphatase: 69 IU/L (ref 44–121)
BUN/Creatinine Ratio: 16 (ref 10–24)
BUN: 20 mg/dL (ref 8–27)
Bilirubin Total: 0.5 mg/dL (ref 0.0–1.2)
CO2: 27 mmol/L (ref 20–29)
Calcium: 9.5 mg/dL (ref 8.6–10.2)
Chloride: 99 mmol/L (ref 96–106)
Creatinine, Ser: 1.28 mg/dL — ABNORMAL HIGH (ref 0.76–1.27)
Globulin, Total: 2.4 g/dL (ref 1.5–4.5)
Glucose: 169 mg/dL — ABNORMAL HIGH (ref 70–99)
Potassium: 4 mmol/L (ref 3.5–5.2)
Sodium: 139 mmol/L (ref 134–144)
Total Protein: 6.7 g/dL (ref 6.0–8.5)
eGFR: 63 mL/min/{1.73_m2} (ref >59)

## 2021-11-15 LAB — LIPID PANEL W/O CHOL/HDL RATIO
Cholesterol, Total: 215 mg/dL — ABNORMAL HIGH (ref 100–199)
HDL: 41 mg/dL (ref >39)
LDL Chol Calc (NIH): 111 mg/dL — ABNORMAL HIGH (ref 0–99)
Triglycerides: 363 mg/dL — ABNORMAL HIGH (ref 0–149)
VLDL Cholesterol Cal: 63 mg/dL — ABNORMAL HIGH (ref 5–40)

## 2021-11-15 LAB — CBC WITH DIFFERENTIAL/PLATELET
Basophils Absolute: 0.1 10*3/uL (ref 0.0–0.2)
Basos: 1 %
EOS (ABSOLUTE): 0.3 10*3/uL (ref 0.0–0.4)
Eos: 4 %
Hematocrit: 47.2 % (ref 37.5–51.0)
Hemoglobin: 16 g/dL (ref 13.0–17.7)
Immature Grans (Abs): 0 10*3/uL (ref 0.0–0.1)
Immature Granulocytes: 0 %
Lymphocytes Absolute: 1.7 10*3/uL (ref 0.7–3.1)
Lymphs: 25 %
MCH: 29.6 pg (ref 26.6–33.0)
MCHC: 33.9 g/dL (ref 31.5–35.7)
MCV: 87 fL (ref 79–97)
Monocytes Absolute: 0.6 10*3/uL (ref 0.1–0.9)
Monocytes: 9 %
Neutrophils Absolute: 4.1 10*3/uL (ref 1.4–7.0)
Neutrophils: 61 %
Platelets: 157 10*3/uL (ref 150–450)
RBC: 5.41 x10E6/uL (ref 4.14–5.80)
RDW: 13.1 % (ref 11.6–15.4)
WBC: 6.8 10*3/uL (ref 3.4–10.8)

## 2021-11-15 LAB — TESTOSTERONE, FREE, TOTAL, SHBG
Sex Hormone Binding: 30.4 nmol/L (ref 19.3–76.4)
Testosterone, Free: 5.6 pg/mL — ABNORMAL LOW (ref 6.6–18.1)
Testosterone: 301 ng/dL (ref 264–916)

## 2021-11-15 LAB — PSA: Prostate Specific Ag, Serum: 2.6 ng/mL (ref 0.0–4.0)

## 2021-11-15 LAB — TSH: TSH: 19.5 u[IU]/mL — ABNORMAL HIGH (ref 0.450–4.500)

## 2021-12-21 LAB — HM DIABETES EYE EXAM

## 2022-02-21 ENCOUNTER — Ambulatory Visit: Payer: Managed Care, Other (non HMO) | Admitting: Family Medicine

## 2022-02-21 ENCOUNTER — Other Ambulatory Visit: Payer: Self-pay

## 2022-02-21 ENCOUNTER — Encounter: Payer: Self-pay | Admitting: Family Medicine

## 2022-02-21 VITALS — BP 155/71 | HR 61 | Temp 98.0°F | Wt 220.0 lb

## 2022-02-21 DIAGNOSIS — E0840 Diabetes mellitus due to underlying condition with diabetic neuropathy, unspecified: Secondary | ICD-10-CM

## 2022-02-21 DIAGNOSIS — Z23 Encounter for immunization: Secondary | ICD-10-CM | POA: Diagnosis not present

## 2022-02-21 LAB — BAYER DCA HB A1C WAIVED: HB A1C (BAYER DCA - WAIVED): 9.7 % — ABNORMAL HIGH (ref 4.8–5.6)

## 2022-02-21 MED ORDER — PIOGLITAZONE HCL 15 MG PO TABS: 15.0000 mg | ORAL_TABLET | Freq: Every day | ORAL | 3 refills | Status: DC

## 2022-02-21 NOTE — Progress Notes (Signed)
? ?BP (!) 155/71   Pulse 61   Temp 98 ?F (36.7 ?C)   Wt 220 lb (99.8 kg)   SpO2 97%   BMI 28.63 kg/m?   ? ?Subjective:  ? ? Patient ID: Manuel Burnett, male    DOB: 11-23-1956, 66 y.o.   MRN: [id number] ? ?HPI: ?Manuel Burnett is a 66 y.o. male ? ?Chief Complaint  ?Patient presents with  ? Diabetes  ?  Patient states he stopped taking the Rybelsus due to it making him break out   ? ?DIABETES- has been off the rybelus for about 3 weeks due to hives.  ?Hypoglycemic episodes:no ?Polydipsia/polyuria: yes ?Visual disturbance: no ?Chest pain: no ?Paresthesias: no ?Glucose Monitoring: no ? Accucheck frequency: Not Checking ?Taking Insulin?: no ?Blood Pressure Monitoring: not checking ?Retinal Examination: Up to Date ?Foot Exam: Up to Date ?Diabetic Education: Completed ?Pneumovax: Up to Date ?Influenza: Up to Date ?Aspirin: yes ? ?Relevant past medical, surgical, family and social history reviewed and updated as indicated. Interim medical history since our last visit reviewed. ?Allergies and medications reviewed and updated. ? ?Review of Systems  ?Constitutional: Negative.   ?Respiratory: Negative.    ?Cardiovascular: Negative.   ?Musculoskeletal: Negative.   ?Neurological: Negative.   ?Psychiatric/Behavioral: Negative.    ? ?Per HPI unless specifically indicated above ? ?   ?Objective:  ?  ?BP (!) 155/71   Pulse 61   Temp 98 ?F (36.7 ?C)   Wt 220 lb (99.8 kg)   SpO2 97%   BMI 28.63 kg/m?   ?Wt Readings from Last 3 Encounters:  ?02/21/22 220 lb (99.8 kg)  ?11/10/21 218 lb 6.4 oz (99.1 kg)  ?08/17/21 214 lb 6.4 oz (97.3 kg)  ?  ?Physical Exam ?Vitals and nursing note reviewed.  ?Constitutional:   ?   General: He is not in acute distress. ?   Appearance: Normal appearance. He is not ill-appearing, toxic-appearing or diaphoretic.  ?HENT:  ?   Head: Normocephalic and atraumatic.  ?   Right Ear: External ear normal.  ?   Left Ear: External ear normal.  ?   Nose: Nose normal.  ?   Mouth/Throat:  ?   Mouth: Mucous  membranes are moist.  ?   Pharynx: Oropharynx is clear.  ?Eyes:  ?   General: No scleral icterus.    ?   Right eye: No discharge.     ?   Left eye: No discharge.  ?   Extraocular Movements: Extraocular movements intact.  ?   Conjunctiva/sclera: Conjunctivae normal.  ?   Pupils: Pupils are equal, round, and reactive to light.  ?Cardiovascular:  ?   Rate and Rhythm: Normal rate and regular rhythm.  ?   Pulses: Normal pulses.  ?   Heart sounds: Normal heart sounds. No murmur heard. ?  No friction rub. No gallop.  ?Pulmonary:  ?   Effort: Pulmonary effort is normal. No respiratory distress.  ?   Breath sounds: Normal breath sounds. No stridor. No wheezing, rhonchi or rales.  ?Chest:  ?   Chest wall: No tenderness.  ?Musculoskeletal:     ?   General: Normal range of motion.  ?   Cervical back: Normal range of motion and neck supple.  ?Skin: ?   General: Skin is warm and dry.  ?   Capillary Refill: Capillary refill takes less than 2 seconds.  ?   Coloration: Skin is not jaundiced or pale.  ?   Findings: No bruising, erythema, lesion  or rash.  ?Neurological:  ?   General: No focal deficit present.  ?   Mental Status: He is alert and oriented to person, place, and time. Mental status is at baseline.  ?Psychiatric:     ?   Mood and Affect: Mood normal.     ?   Behavior: Behavior normal.     ?   Thought Content: Thought content normal.     ?   Judgment: Judgment normal.  ? ? ?Results for orders placed or performed in visit on 01/18/22  ?HM DIABETES EYE EXAM  ?Result Value Ref Range  ? HM Diabetic Eye Exam No Retinopathy No Retinopathy  ? ?   ?Assessment & Plan:  ? ?Problem List Items Addressed This Visit   ? ?  ? Endocrine  ? Diabetes mellitus due to underlying condition with diabetic neuropathy ([place]) - Primary  ?  Already on metformin and jardiance. Unable to tolerate GLP-1s due to rashes. 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 Has only been off rybelsus for 3 weeks. Not doing well with A1c of 9.7- will add actos and recheck 3  months. Call with any concerns.  ?  ?  ? Relevant Medications  ? pioglitazone (ACTOS) 15 MG tablet  ? Other Relevant Orders  ? Bayer DCA Hb A1c Waived  ? ?Other Visit Diagnoses   ? ? Need for shingles vaccine      ? Relevant Orders  ? Varicella-zoster vaccine IM (Shingrix) (Completed)  ? ?  ?  ? ?Follow up plan: ?Return in about 3 months (around 05/24/2022). ? ? ? ? ? ?

## 2022-02-21 NOTE — Assessment & Plan Note (Signed)
Already on metformin and jardiance. Unable to tolerate GLP-1s due to rashes. 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 Has only been off rybelsus for 3 weeks. Not doing well with A1c of 9.7- will add actos and recheck 3 months. Call with any concerns.  ?

## 2022-03-15 ENCOUNTER — Other Ambulatory Visit: Payer: Self-pay | Admitting: Family Medicine

## 2022-03-17 ENCOUNTER — Other Ambulatory Visit: Payer: Self-pay | Admitting: Family Medicine

## 2022-03-17 NOTE — Telephone Encounter (Signed)
Requested medications are due for refill today.  yes ? ?Requested medications are on the active medications list.  yes ? ?Last refill. 06/14/2021 71m 5 refills ? ?Future visit scheduled.   yes ? ?Notes to clinic.  Medication not assigned a protocol - please review for refill. ? ? ? ?Requested Prescriptions  ?Pending Prescriptions Disp Refills  ? testosterone cypionate (DEPOTESTOSTERONE CYPIONATE) 200 MG/ML injection [Pharmacy Med Name: TESTOSTERONE CYP '200MG'$ /ML SDV 1ML] 1 mL   ?  Sig: ADMINISTER 0.5 ML(100 MG) IN THE MUSCLE EVERY 14 DAYS  ?  ? Off-Protocol Failed - 03/15/2022  9:07 PM  ?  ?  Failed - Medication not assigned to a protocol, review manually.  ?  ?  Passed - Valid encounter within last 12 months  ?  Recent Outpatient Visits   ? ?      ? 3 weeks ago Diabetes mellitus due to underlying condition with diabetic neuropathy, without long-term current use of insulin ([place]  ? [place] DO  ? 4 months ago Routine general medical examination at a health care facility  ? [place], DO  ? 7 months ago Diabetes mellitus due to underlying condition with diabetic neuropathy, without long-term current use of insulin ([place]  ? CR[place] Megan P, DO  ? 9 months ago Diabetes mellitus due to underlying condition with diabetic neuropathy, without long-term current use of insulin ([place]  ? CO[place] Megan P, DO  ? 9 months ago COVID-19  ? [facility]  ? ?  ?  ?Future Appointments   ? ?        ? In 2 months JWynetta Emery MBarb Merino DO [facility]  ? ?  ? ?  ?  ?  ?  ?

## 2022-03-17 NOTE — Telephone Encounter (Signed)
Copied from [place] [id number]. Topic: Quick Communication - Rx Refill/Question ?>> Mar 17, 2022  2:45 PM Tessa Lerner A wrote: ?Medication: testosterone cypionate (DEPOTESTOSTERONE CYPIONATE) 200 MG/ML injection [[id number]] ? ?Has the patient contacted their pharmacy? Yes.  The patient's been directed to contact their PCP ?(Agent: If no, request that the patient contact the pharmacy for the refill. If patient does not wish to contact the pharmacy document the reason why and proceed with request.) ?(Agent: If yes, when and what did the pharmacy advise?) ? ?Preferred Pharmacy (with phone number or street name): [facility] [place], [address] ?[place] [address] ?Phone: [phone] Fax: [phone] ?Hours: Not open 24 hours ? ?Has the patient been seen for an appointment in the last year OR does the patient have an upcoming appointment? Yes.   ? ?Agent: Please be advised that RX refills may take up to 3 business days. We ask that you follow-up with your pharmacy. ?

## 2022-03-17 NOTE — Telephone Encounter (Signed)
Requested medication (s) are due for refill today: yes ? ?Requested medication (s) are on the active medication list: yes   ? ?Last refill: 06/14/21  1 ml  5 refills ? ?Future visit scheduled yes 05/24/22 ? ?Notes to clinic:Off protocol, please review. Thank you ? ?Requested Prescriptions  ?Pending Prescriptions Disp Refills  ? testosterone cypionate (DEPOTESTOSTERONE CYPIONATE) 200 MG/ML injection [Pharmacy Med Name: TESTOSTERONE CYP '200MG'$ /ML SDV 1ML] 1 mL   ?  Sig: ADMINISTER 0.5 ML(100 MG) IN THE MUSCLE EVERY 14 DAYS  ?  ? Off-Protocol Failed - 03/15/2022  9:23 PM  ?  ?  Failed - Medication not assigned to a protocol, review manually.  ?  ?  Passed - Valid encounter within last 12 months  ?  Recent Outpatient Visits   ? ?      ? 3 weeks ago Diabetes mellitus due to underlying condition with diabetic neuropathy, without long-term current use of insulin ([place])  ? [place], DO  ? 4 months ago Routine general medical examination at a health care facility  ? [place], [place] P, DO  ? 7 months ago Diabetes mellitus due to underlying condition with diabetic neuropathy, without long-term current use of insulin ([place])  ? [place], Megan P, DO  ? 9 months ago Diabetes mellitus due to underlying condition with diabetic neuropathy, without long-term current use of insulin ([place])  ? [place], Megan P, DO  ? 9 months ago COVID-19  ? [facility] Jon Billings, NP  ? ?  ?  ?Future Appointments   ? ?        ? In 2 months Wynetta Emery, Barb Merino, DO [facility]  ? ?  ? ?  ?  ?  ? ? ? ? ?

## 2022-03-18 NOTE — Telephone Encounter (Signed)
Requested medication (s) are due for refill today - yes ? ?Requested medication (s) are on the active medication list -yes ? ?Future visit scheduled -yes ? ?Last refill: 06/14/21 57m 5RF ? ?Notes to clinic: Request RF: medication not assigned protocol- sent for review  ? ?Requested Prescriptions  ?Pending Prescriptions Disp Refills  ? testosterone cypionate (DEPOTESTOSTERONE CYPIONATE) 200 MG/ML injection 1 mL 5  ?  Sig: Inject 0.5 mLs (100 mg total) into the muscle every 14 (fourteen) days.  ?  ? Off-Protocol Failed - 03/17/2022  3:26 PM  ?  ?  Failed - Medication not assigned to a protocol, review manually.  ?  ?  Passed - Valid encounter within last 12 months  ?  Recent Outpatient Visits   ? ?      ? 3 weeks ago Diabetes mellitus due to underlying condition with diabetic neuropathy, without long-term current use of insulin ([place]  ? CG[place] DO  ? 4 months ago Routine general medical examination at a health care facility  ? [place]P, DO  ? 7 months ago Diabetes mellitus due to underlying condition with diabetic neuropathy, without long-term current use of insulin ([place]  ? C[place] Megan P, DO  ? 9 months ago Diabetes mellitus due to underlying condition with diabetic neuropathy, without long-term current use of insulin ([place]  ? CBarnes Megan P, DO  ? 9 months ago COVID-19  ? [facility] Bill[facility]  ? ?  ?  ?Future Appointments   ? ?        ? In 2 months JWynetta Emery MBarb Merino DO [facility]  ? ?  ? ?  ?  ?  ? ? ? ?Requested Prescriptions  ?Pending Prescriptions Disp Refills  ? testosterone cypionate (DEPOTESTOSTERONE CYPIONATE) 200 MG/ML injection 1 mL 5  ?  Sig: Inject 0.5 mLs (100 mg total) into the muscle every 14 (fourteen) days.  ?  ? Off-Protocol Failed - 03/17/2022  3:26 PM  ?  ?  Failed - Medication not assigned to a protocol, review manually.  ?  ?  Passed  - Valid encounter within last 12 months  ?  Recent Outpatient Visits   ? ?      ? 3 weeks ago Diabetes mellitus due to underlying condition with diabetic neuropathy, without long-term current use of insulin ([place]  ? [place] DO  ? 4 months ago Routine general medical examination at a health care facility  ? COklee M[place]P, DO  ? 7 months ago Diabetes mellitus due to underlying condition with diabetic neuropathy, without long-term current use of insulin ([place]  ? CB[place] Megan P, DO  ? 9 months ago Diabetes mellitus due to underlying condition with diabetic neuropathy, without long-term current use of insulin ([place]  ? [place] P, DO  ? 9 months ago COVID-19  ? [facility] Bill[facility] NP  ? ?  ?  ?Future Appointments   ? ?        ? In 2 months JWynetta Emery MBarb Merino DO [facility]  ? ?  ? ?  ?  ?  ? ? ? ?

## 2022-04-11 NOTE — Progress Notes (Signed)
? ?BP (!) 187/87   Pulse (!) 54   Temp 97.9 ?F (36.6 ?C)   Wt 222 lb 9.6 oz (101 kg)   SpO2 97%   BMI 28.97 kg/m?   ? ?Subjective:  ? ? Patient ID: Manuel Burnett, male    DOB: 09/14/1956, 66 y.o.   MRN: [id number] ? ?HPI: ?Manuel Burnett is a 66 y.o. male ? ?Chief Complaint  ?Patient presents with  ? Cough  ?  Patient states he is coughing and has been using mucinex otc for about a week. Patient states he is wheezing.   ? ?UPPER RESPIRATORY TRACT INFECTION ?Duration: 7-10 days ?Worst symptom: congestion ?Fever: yes ?Cough: yes ?Shortness of breath: yes ?Wheezing: yes ?Chest pain: no ?Chest tightness: yes ?Chest congestion: yes ?Nasal congestion: yes ?Runny nose: yes ?Post nasal drip: yes ?Sneezing: no ?Sore throat: no ?Swollen glands: no ?Sinus pressure: yes ?Headache: no ?Face pain: yes ?Toothache: no ?Ear pain: no  ?Ear pressure: no  ?Eyes red/itching:no ?Eye drainage/crusting: no  ?Vomiting: no ?Rash: no ?Fatigue: yes ?Sick contacts: no ?Strep contacts: no  ?Context: worse ?Recurrent sinusitis: no ?Relief with OTC cold/cough medications: no  ?Treatments attempted:  mucinex-DM ? ?Relevant past medical, surgical, family and social history reviewed and updated as indicated. Interim medical history since our last visit reviewed. ?Allergies and medications reviewed and updated. ? ?Review of Systems  ?Constitutional: Negative.   ?HENT:  Positive for congestion, postnasal drip, rhinorrhea, sinus pressure and sinus pain. Negative for dental problem, drooling, ear discharge, ear pain, facial swelling, hearing loss, mouth sores, nosebleeds, sneezing, sore throat, tinnitus, trouble swallowing and voice change.   ?Respiratory:  Positive for cough, chest tightness, shortness of breath and wheezing. Negative for apnea, choking and stridor.   ?Cardiovascular: Negative.   ?Gastrointestinal: Negative.   ?Musculoskeletal: Negative.   ?Psychiatric/Behavioral: Negative.    ? ?Per HPI unless specifically indicated above ? ?    ?Objective:  ?  ?BP (!) 187/87   Pulse (!) 54   Temp 97.9 ?F (36.6 ?C)   Wt 222 lb 9.6 oz (101 kg)   SpO2 97%   BMI 28.97 kg/m?   ?Wt Readings from Last 3 Encounters:  ?04/12/22 222 lb 9.6 oz (101 kg)  ?02/21/22 220 lb (99.8 kg)  ?11/10/21 218 lb 6.4 oz (99.1 kg)  ?  ?Physical Exam ?Vitals and nursing note reviewed.  ?Constitutional:   ?   General: He is not in acute distress. ?   Appearance: Normal appearance. He is not ill-appearing, toxic-appearing or diaphoretic.  ?HENT:  ?   Head: Normocephalic and atraumatic.  ?   Right Ear: Ear canal and external ear normal. There is impacted cerumen.  ?   Left Ear: Ear canal and external ear normal. There is impacted cerumen.  ?   Nose: Congestion and rhinorrhea present.  ?   Mouth/Throat:  ?   Mouth: Mucous membranes are moist.  ?   Pharynx: Oropharynx is clear. No oropharyngeal exudate or posterior oropharyngeal erythema.  ?Eyes:  ?   General: No scleral icterus.    ?   Right eye: No discharge.     ?   Left eye: No discharge.  ?   Extraocular Movements: Extraocular movements intact.  ?   Conjunctiva/sclera: Conjunctivae normal.  ?   Pupils: Pupils are equal, round, and reactive to light.  ?Cardiovascular:  ?   Rate and Rhythm: Normal rate and regular rhythm.  ?   Pulses: Normal pulses.  ?   Heart  sounds: Normal heart sounds. No murmur heard. ?  No friction rub. No gallop.  ?Pulmonary:  ?   Effort: Pulmonary effort is normal. No respiratory distress.  ?   Breath sounds: Normal breath sounds. No stridor. No wheezing, rhonchi or rales.  ?Chest:  ?   Chest wall: No tenderness.  ?Musculoskeletal:     ?   General: Normal range of motion.  ?   Cervical back: Normal range of motion and neck supple.  ?Skin: ?   General: Skin is warm and dry.  ?   Capillary Refill: Capillary refill takes less than 2 seconds.  ?   Coloration: Skin is not jaundiced or pale.  ?   Findings: No bruising, erythema, lesion or rash.  ?Neurological:  ?   General: No focal deficit present.  ?   Mental  Status: He is alert and oriented to person, place, and time. Mental status is at baseline.  ?Psychiatric:     ?   Mood and Affect: Mood normal.     ?   Behavior: Behavior normal.     ?   Thought Content: Thought content normal.     ?   Judgment: Judgment normal.  ? ? ?Results for orders placed or performed in visit on 02/21/22  ?Bayer DCA Hb A1c Waived  ?Result Value Ref Range  ? HB A1C (BAYER DCA - WAIVED) 9.7 (H) 4.8 - 5.6 %  ? ?   ?Assessment & Plan:  ? ?Problem List Items Addressed This Visit   ? ?  ? Genitourinary  ? Benign hypertensive renal disease  ?  BP running high due to mucinex DM. Will stop. Call with any concerns.  ? ?  ?  ? ?Other Visit Diagnoses   ? ? Acute non-recurrent maxillary sinusitis    -  Primary  ? Will treat with prednisone and doxycycline. Call with any concerns or if not getting better.   ? Relevant Medications  ? predniSONE (DELTASONE) 50 MG tablet  ? doxycycline (VIBRA-TABS) 100 MG tablet  ? ?  ?  ? ?Follow up plan: ?Return As scheduled. ? ? ? ? ? ?

## 2022-04-12 ENCOUNTER — Encounter: Payer: Self-pay | Admitting: Family Medicine

## 2022-04-12 ENCOUNTER — Ambulatory Visit: Payer: Managed Care, Other (non HMO) | Admitting: Family Medicine

## 2022-04-12 VITALS — BP 187/87 | HR 54 | Temp 97.9°F | Wt 222.6 lb

## 2022-04-12 DIAGNOSIS — I129 Hypertensive chronic kidney disease with stage 1 through stage 4 chronic kidney disease, or unspecified chronic kidney disease: Secondary | ICD-10-CM | POA: Diagnosis not present

## 2022-04-12 DIAGNOSIS — J0100 Acute maxillary sinusitis, unspecified: Secondary | ICD-10-CM

## 2022-04-12 MED ORDER — PREDNISONE 50 MG PO TABS: 50.0000 mg | ORAL_TABLET | Freq: Every day | ORAL | 0 refills | Status: DC

## 2022-04-12 MED ORDER — DOXYCYCLINE HYCLATE 100 MG PO TABS
100.0000 mg | ORAL_TABLET | Freq: Two times a day (BID) | ORAL | 0 refills | Status: DC
Start: 2022-04-12 — End: 2022-05-31

## 2022-04-12 NOTE — Assessment & Plan Note (Signed)
BP running high due to mucinex DM. Will stop. Call with any concerns.  ?

## 2022-05-24 ENCOUNTER — Ambulatory Visit: Payer: Managed Care, Other (non HMO) | Admitting: Family Medicine

## 2022-05-31 ENCOUNTER — Ambulatory Visit: Payer: Managed Care, Other (non HMO) | Admitting: Family Medicine

## 2022-05-31 ENCOUNTER — Encounter: Payer: Self-pay | Admitting: Family Medicine

## 2022-05-31 VITALS — BP 126/76 | HR 62 | Temp 98.0°F | Wt 220.4 lb

## 2022-05-31 DIAGNOSIS — I129 Hypertensive chronic kidney disease with stage 1 through stage 4 chronic kidney disease, or unspecified chronic kidney disease: Secondary | ICD-10-CM | POA: Diagnosis not present

## 2022-05-31 DIAGNOSIS — E039 Hypothyroidism, unspecified: Secondary | ICD-10-CM

## 2022-05-31 DIAGNOSIS — E782 Mixed hyperlipidemia: Secondary | ICD-10-CM

## 2022-05-31 DIAGNOSIS — E0840 Diabetes mellitus due to underlying condition with diabetic neuropathy, unspecified: Secondary | ICD-10-CM | POA: Diagnosis not present

## 2022-05-31 DIAGNOSIS — E291 Testicular hypofunction: Secondary | ICD-10-CM

## 2022-05-31 LAB — BAYER DCA HB A1C WAIVED: HB A1C (BAYER DCA - WAIVED): 9.5 % — ABNORMAL HIGH (ref 4.8–5.6)

## 2022-05-31 MED ORDER — ALBUTEROL SULFATE HFA 108 (90 BASE) MCG/ACT IN AERS: 2.0000 | INHALATION_SPRAY | RESPIRATORY_TRACT | 1 refills | Status: AC | PRN

## 2022-05-31 MED ORDER — ROSUVASTATIN CALCIUM 10 MG PO TABS: ORAL_TABLET | ORAL | 1 refills | Status: DC

## 2022-05-31 MED ORDER — EMPAGLIFLOZIN 25 MG PO TABS: 25.0000 mg | ORAL_TABLET | Freq: Every day | ORAL | 1 refills | Status: DC

## 2022-05-31 MED ORDER — LOSARTAN POTASSIUM 50 MG PO TABS: 50.0000 mg | ORAL_TABLET | Freq: Every day | ORAL | 1 refills | Status: DC

## 2022-05-31 MED ORDER — FENOFIBRATE 160 MG PO TABS: 160.0000 mg | ORAL_TABLET | Freq: Every day | ORAL | 1 refills | Status: DC

## 2022-05-31 MED ORDER — GLIPIZIDE 5 MG PO TABS: 5.0000 mg | ORAL_TABLET | Freq: Two times a day (BID) | ORAL | 3 refills | Status: DC

## 2022-05-31 MED ORDER — HYDROCHLOROTHIAZIDE 25 MG PO TABS: 25.0000 mg | ORAL_TABLET | Freq: Every day | ORAL | 1 refills | Status: DC

## 2022-05-31 MED ORDER — PIOGLITAZONE HCL 30 MG PO TABS: 30.0000 mg | ORAL_TABLET | Freq: Every day | ORAL | 1 refills | Status: DC

## 2022-05-31 MED ORDER — AMLODIPINE BESYLATE 10 MG PO TABS: 10.0000 mg | ORAL_TABLET | Freq: Every day | ORAL | 1 refills | Status: DC

## 2022-05-31 MED ORDER — METFORMIN HCL ER 500 MG PO TB24
ORAL_TABLET | ORAL | 1 refills | Status: DC
Start: 2022-05-31 — End: 2022-12-05

## 2022-05-31 NOTE — Progress Notes (Signed)
BP 126/76 (BP Location: Left Arm, Patient Position: Sitting, Cuff Size: Normal)   Pulse 62   Temp 98 F (36.7 C)   Wt 220 lb 6.4 oz (100 kg)   SpO2 97%   BMI 28.68 kg/m    Subjective:    Patient ID: Manuel Burnett, male    DOB: 09-03-1956, 66 y.o.   MRN: [id number]  HPI: Manuel Burnett is a 66 y.o. male  Chief Complaint  Patient presents with   Diabetes   DIABETES Hypoglycemic episodes:no Polydipsia/polyuria: no Visual disturbance: no Chest pain: no Paresthesias: no Glucose Monitoring: no Taking Insulin?: no Blood Pressure Monitoring: not checking Retinal Examination: Up to Date Foot Exam: Up to Date Diabetic Education: Completed Pneumovax: Up to Date Influenza: Up to Date Aspirin: yes  HYPERTENSION / HYPERLIPIDEMIA Satisfied with current treatment? yes Duration of hypertension: chronic BP monitoring frequency: not checking BP medication side effects: no Past BP meds: losartan, amlodipine, HCTZ Duration of hyperlipidemia: chronic Cholesterol medication side effects: no Cholesterol supplements: none Past cholesterol medications: crestor, fenofibrate Medication compliance: excellent compliance Aspirin: no Recent stressors: no Recurrent headaches: no Visual changes: no Palpitations: no Dyspnea: no Chest pain: no Lower extremity edema: no Dizzy/lightheaded: no  HYPOTHYROIDISM Thyroid control status: unsure Satisfied with current treatment? unsure Medication side effects: no Medication compliance: excellent compliance Etiology of hypothyroidism:  Recent dose adjustment:no Fatigue: yes Cold intolerance: yes Heat intolerance: no Weight gain: no Weight loss: no Constipation: no Diarrhea/loose stools: no Palpitations: no Lower extremity edema: no Anxiety/depressed mood: no  LOW TESTOSTERONE Duration: chronic Status: stable  Satisfied with current treatment:  yes Medication side effects:  no Medication compliance: excellent  compliance Decreased libido: yes Fatigue: yes Depressed mood: no Muscle weakness: no Erectile dysfunction: no  Relevant past medical, surgical, family and social history reviewed and updated as indicated. Interim medical history since our last visit reviewed. Allergies and medications reviewed and updated.  Review of Systems  Constitutional:  Positive for fatigue. Negative for activity change, appetite change, chills, diaphoresis, fever and unexpected weight change.  Respiratory: Negative.    Cardiovascular: Negative.   Gastrointestinal: Negative.   Musculoskeletal: Negative.   Neurological: Negative.   Psychiatric/Behavioral: Negative.      Per HPI unless specifically indicated above     Objective:    BP 126/76 (BP Location: Left Arm, Patient Position: Sitting, Cuff Size: Normal)   Pulse 62   Temp 98 F (36.7 C)   Wt 220 lb 6.4 oz (100 kg)   SpO2 97%   BMI 28.68 kg/m   Wt Readings from Last 3 Encounters:  05/31/22 220 lb 6.4 oz (100 kg)  04/12/22 222 lb 9.6 oz (101 kg)  02/21/22 220 lb (99.8 kg)    Physical Exam Vitals and nursing note reviewed.  Constitutional:      General: He is not in acute distress.    Appearance: Normal appearance. He is not ill-appearing, toxic-appearing or diaphoretic.  HENT:     Head: Normocephalic and atraumatic.     Right Ear: External ear normal.     Left Ear: External ear normal.     Nose: Nose normal.     Mouth/Throat:     Mouth: Mucous membranes are moist.     Pharynx: Oropharynx is clear.  Eyes:     General: No scleral icterus.       Right eye: No discharge.        Left eye: No discharge.     Extraocular Movements: Extraocular movements intact.  Conjunctiva/sclera: Conjunctivae normal.     Pupils: Pupils are equal, round, and reactive to light.  Cardiovascular:     Rate and Rhythm: Normal rate and regular rhythm.     Pulses: Normal pulses.     Heart sounds: Normal heart sounds. No murmur heard.    No friction rub. No  gallop.  Pulmonary:     Effort: Pulmonary effort is normal. No respiratory distress.     Breath sounds: Normal breath sounds. No stridor. No wheezing, rhonchi or rales.  Chest:     Chest wall: No tenderness.  Musculoskeletal:        General: Normal range of motion.     Cervical back: Normal range of motion and neck supple.  Skin:    General: Skin is warm and dry.     Capillary Refill: Capillary refill takes less than 2 seconds.     Coloration: Skin is not jaundiced or pale.     Findings: No bruising, erythema, lesion or rash.  Neurological:     General: No focal deficit present.     Mental Status: He is alert and oriented to person, place, and time. Mental status is at baseline.  Psychiatric:        Mood and Affect: Mood normal.        Behavior: Behavior normal.        Thought Content: Thought content normal.        Judgment: Judgment normal.     Results for orders placed or performed in visit on 02/21/22  Bayer DCA Hb A1c Waived  Result Value Ref Range   HB A1C (BAYER DCA - WAIVED) 9.7 (H) 4.8 - 5.6 %      Assessment & Plan:   Problem List Items Addressed This Visit       Endocrine   Diabetes mellitus due to underlying condition with diabetic neuropathy ([place]) - Primary    Doing better with A1c of 9.5, but still uncontrolled. Will increase actos to '30mg'$  and add glipizide. Recheck tolerance in 1 month. Call with any concerns. Continue other medicines.       Relevant Medications   pioglitazone (ACTOS) 30 MG tablet   glipiZIDE (GLUCOTROL) 5 MG tablet   rosuvastatin (CRESTOR) 10 MG tablet   metFORMIN (GLUCOPHAGE-XR) 500 MG 24 hr tablet   losartan (COZAAR) 50 MG tablet   empagliflozin (JARDIANCE) 25 MG TABS tablet   Other Relevant Orders   Comprehensive metabolic panel   Bayer DCA Hb A1c Waived   Lipid Panel w/o Chol/HDL Ratio   CBC with Differential/Platelet   Male hypogonadism    Rechecking labs today. Await results. Treat as needed.       Relevant Orders    Testosterone, free, total([facility])   Hypothyroidism    Rechecking labs today. Await results. Treat as needed.       Relevant Orders   TSH     Genitourinary   Benign hypertensive renal disease    Under good control on current regimen. Continue current regimen. Continue to monitor. Call with any concerns. Refills given. Labs drawn today.          Other   Hyperlipemia    Under good control on current regimen. Continue current regimen. Continue to monitor. Call with any concerns. Refills given. Labs drawn today.       Relevant Medications   rosuvastatin (CRESTOR) 10 MG tablet   losartan (COZAAR) 50 MG tablet   hydrochlorothiazide (HYDRODIURIL) 25 MG tablet   fenofibrate 160 MG tablet  amLODipine (NORVASC) 10 MG tablet     Follow up plan: Return in about 4 weeks (around 06/28/2022).

## 2022-05-31 NOTE — Assessment & Plan Note (Signed)
Under good control on current regimen. Continue current regimen. Continue to monitor. Call with any concerns. Refills given. Labs drawn today.   

## 2022-05-31 NOTE — Assessment & Plan Note (Signed)
Doing better with A1c of 9.5, but still uncontrolled. Will increase actos to '30mg'$  and add glipizide. Recheck tolerance in 1 month. Call with any concerns. Continue other medicines.

## 2022-05-31 NOTE — Assessment & Plan Note (Signed)
Rechecking labs today. Await results. Treat as needed.  °

## 2022-06-01 ENCOUNTER — Other Ambulatory Visit: Payer: Self-pay | Admitting: Family Medicine

## 2022-06-01 DIAGNOSIS — E039 Hypothyroidism, unspecified: Secondary | ICD-10-CM

## 2022-06-01 MED ORDER — LEVOTHYROXINE SODIUM 300 MCG PO TABS
300.0000 ug | ORAL_TABLET | Freq: Every day | ORAL | 0 refills | Status: DC
Start: 2022-06-01 — End: 2022-08-25

## 2022-06-03 LAB — COMPREHENSIVE METABOLIC PANEL
ALT: 11 IU/L (ref 0–44)
AST: 12 IU/L (ref 0–40)
Albumin/Globulin Ratio: 2.3 — ABNORMAL HIGH (ref 1.2–2.2)
Albumin: 4.6 g/dL (ref 3.8–4.8)
Alkaline Phosphatase: 65 IU/L (ref 44–121)
BUN/Creatinine Ratio: 12 (ref 10–24)
BUN: 14 mg/dL (ref 8–27)
Bilirubin Total: 0.4 mg/dL (ref 0.0–1.2)
CO2: 27 mmol/L (ref 20–29)
Calcium: 9.8 mg/dL (ref 8.6–10.2)
Chloride: 98 mmol/L (ref 96–106)
Creatinine, Ser: 1.13 mg/dL (ref 0.76–1.27)
Globulin, Total: 2 g/dL (ref 1.5–4.5)
Glucose: 241 mg/dL — ABNORMAL HIGH (ref 70–99)
Potassium: 3.8 mmol/L (ref 3.5–5.2)
Sodium: 138 mmol/L (ref 134–144)
Total Protein: 6.6 g/dL (ref 6.0–8.5)
eGFR: 73 mL/min/{1.73_m2} (ref >59)

## 2022-06-03 LAB — CBC WITH DIFFERENTIAL/PLATELET
Basophils Absolute: 0.1 10*3/uL (ref 0.0–0.2)
Basos: 1 %
EOS (ABSOLUTE): 0.3 10*3/uL (ref 0.0–0.4)
Eos: 4 %
Hematocrit: 47.2 % (ref 37.5–51.0)
Hemoglobin: 15.8 g/dL (ref 13.0–17.7)
Immature Grans (Abs): 0 10*3/uL (ref 0.0–0.1)
Immature Granulocytes: 0 %
Lymphocytes Absolute: 1.4 10*3/uL (ref 0.7–3.1)
Lymphs: 23 %
MCH: 29.5 pg (ref 26.6–33.0)
MCHC: 33.5 g/dL (ref 31.5–35.7)
MCV: 88 fL (ref 79–97)
Monocytes Absolute: 0.6 10*3/uL (ref 0.1–0.9)
Monocytes: 11 %
Neutrophils Absolute: 3.6 10*3/uL (ref 1.4–7.0)
Neutrophils: 61 %
Platelets: 143 10*3/uL — ABNORMAL LOW (ref 150–450)
RBC: 5.35 x10E6/uL (ref 4.14–5.80)
RDW: 13.3 % (ref 11.6–15.4)
WBC: 5.9 10*3/uL (ref 3.4–10.8)

## 2022-06-03 LAB — LIPID PANEL W/O CHOL/HDL RATIO
Cholesterol, Total: 240 mg/dL — ABNORMAL HIGH (ref 100–199)
HDL: 42 mg/dL (ref >39)
LDL Chol Calc (NIH): 128 mg/dL — ABNORMAL HIGH (ref 0–99)
Triglycerides: 392 mg/dL — ABNORMAL HIGH (ref 0–149)
VLDL Cholesterol Cal: 70 mg/dL — ABNORMAL HIGH (ref 5–40)

## 2022-06-03 LAB — TSH: TSH: 11.3 u[IU]/mL — ABNORMAL HIGH (ref 0.450–4.500)

## 2022-06-03 LAB — TESTOSTERONE, FREE, TOTAL, SHBG
Sex Hormone Binding: 32.1 nmol/L (ref 19.3–76.4)
Testosterone, Free: 6.2 pg/mL — ABNORMAL LOW (ref 6.6–18.1)
Testosterone: 461 ng/dL (ref 264–916)

## 2022-06-06 ENCOUNTER — Ambulatory Visit: Payer: Managed Care, Other (non HMO)

## 2022-06-06 NOTE — Progress Notes (Incomplete)
[facility] [address] [place], [place] [address]  Pulmonary Sleep Medicine   Office Visit Note  Patient Name: Manuel Burnett DOB: 10/27/1956 MRN [id number]    Chief Complaint: Obstructive Sleep Apnea visit  Brief History:  Manuel Burnett is seen today for initial consult to establish care & get face to face notes for replacement of his 66 year old machine.  Initial symptoms included snoring, sleepiness, HTN, heartburn. The patient has a 13 year history of sleep apnea. Patient *** using PAP nightly.  The patient feels *** after sleeping with PAP.  The patient reports *** from PAP use. Reported sleepiness is  *** and the Epworth Sleepiness Score is *** out of 24. The patient *** take naps. The patient complains of the following: ***  The compliance download shows  compliance with an average use time of *** hours. The AHI is ***  The patient *** of limb movements disrupting sleep.  ROS  General: (-) fever, (-) chills, (-) night sweat Nose and Sinuses: (-) nasal stuffiness or itchiness, (-) postnasal drip, (-) nosebleeds, (-) sinus trouble. Mouth and Throat: (-) sore throat, (-) hoarseness. Neck: (-) swollen glands, (-) enlarged thyroid, (-) neck pain. Respiratory: *** cough, *** shortness of breath, *** wheezing. Neurologic: *** numbness, *** tingling. Psychiatric: *** anxiety, *** depression   Current Medication: Outpatient Encounter Medications as of 06/06/2022  Medication Sig  . albuterol (PROAIR HFA) 108 (90 Base) MCG/ACT inhaler Inhale 2 puffs into the lungs every 4 (four) hours as needed for wheezing or shortness of breath.  . amLODipine (NORVASC) 10 MG tablet Take 1 tablet (10 mg total) by mouth daily.  . B-D INTEGRA SYRINGE 22G X 1-1/2" 3 ML MISC Inject testosterone 1x every 2 weeks  . BD HYPODERMIC NEEDLE 18G X 1" MISC INJECT TESTOSTERONE EVERY 2 WEEKS AS DIRECTED  . empagliflozin (JARDIANCE) 25 MG TABS tablet Take 1 tablet (25 mg total) by mouth daily before  breakfast.  . fenofibrate 160 MG tablet Take 1 tablet (160 mg total) by mouth daily. with food  . glipiZIDE (GLUCOTROL) 5 MG tablet Take 1 tablet (5 mg total) by mouth 2 (two) times daily before a meal.  . glucose blood test strip Use as instructed  . hydrochlorothiazide (HYDRODIURIL) 25 MG tablet Take 1 tablet (25 mg total) by mouth daily.  . levothyroxine (SYNTHROID) 300 MCG tablet Take 1 tablet (300 mcg total) by mouth daily before breakfast.  . losartan (COZAAR) 50 MG tablet Take 1 tablet (50 mg total) by mouth daily.  . metFORMIN (GLUCOPHAGE-XR) 500 MG 24 hr tablet TAKE 2 TABLETS(1000 MG) BY MOUTH IN THE MORNING AND AT BEDTIME  . Multiple Vitamin (MULTIVITAMIN WITH MINERALS) TABS tablet Take 1 tablet by mouth daily.  . pioglitazone (ACTOS) 30 MG tablet Take 1 tablet (30 mg total) by mouth daily.  . rosuvastatin (CRESTOR) 10 MG tablet TAKE 1 TABLET(10 MG) BY MOUTH DAILY  . sildenafil (VIAGRA) 100 MG tablet Take 1 tablet (100 mg total) by mouth daily as needed for erectile dysfunction.  . testosterone cypionate (DEPOTESTOSTERONE CYPIONATE) 200 MG/ML injection Inject 0.5 mLs (100 mg total) into the muscle every 14 (fourteen) days.   No facility-administered encounter medications on file as of 06/06/2022.    Surgical History: Past Surgical History:  Procedure Laterality Date  . CATARACT EXTRACTION W/PHACO Right 12/26/2017   Procedure: CATARACT EXTRACTION PHACO AND INTRAOCULAR LENS PLACEMENT (IOC);  Surgeon: Porfilio, William, MD;  Location: [facility] ORS;  Service: Ophthalmology;  Laterality: Right;  US   00:21.8 AP% 21.1 CDE 2.64 FLUID PACK LOT # [id number]  . CATARACT EXTRACTION W/PHACO Left 01/09/2018   Procedure: CATARACT EXTRACTION PHACO AND INTRAOCULAR LENS PLACEMENT (IOC);  Surgeon: Porfilio, William, MD;  Location: [facility] ORS;  Service: Ophthalmology;  Laterality: Left;  US 00:28.9 AP% 10.0 CDE 2.90 Fluid pcak lot # [id number]  . CLAVICLE SURGERY     internal fixation  . COLONOSCOPY WITH  PROPOFOL N/A 04/06/2020   Procedure: COLONOSCOPY WITH PROPOFOL;  Surgeon: Vanga, Rohini Reddy, MD;  Location: [facility] ENDOSCOPY;  Service: Gastroenterology;  Laterality: N/A;  priority 4  . EYE SURGERY Bilateral 12/2017   Cataract  . FRACTURE SURGERY    . HEMORRHOID SURGERY    . TONSILLECTOMY    . TONSILLECTOMY AND ADENOIDECTOMY      Medical History: Past Medical History:  Diagnosis Date  . Cancer (HCC)    SKIN  . Chronic kidney disease   . Diabetes mellitus without complication (HCC)   . 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 Erectile dysfunction   . GERD (gastroesophageal reflux disease)   . Hyperlipidemia   . Hypertension   . Hypogonadism male   . Hypothyroidism   . Proteinuria   . Sleep apnea    CPAP  . Thyroid disease     Family History: Non contributory to the present illness  Social History: Social History   Socioeconomic History  . Marital status: Married    Spouse name: Not on file  . Number of children: Not on file  . Years of education: Not on file  . Highest education level: Not on file  Occupational History  . Not on file  Tobacco Use  . Smoking status: Every Day    Packs/day: 0.50    Types: Cigarettes  . Smokeless tobacco: Never  Vaping Use  . Vaping Use: Never used  Substance and Sexual Activity  . Alcohol use: Not Currently    Alcohol/week: 0.0 standard drinks of alcohol  . Drug use: No  . Sexual activity: Not Currently  Other Topics Concern  . Not on file  Social History Narrative  . Not on file   Social Determinants of Health   Financial Resource Strain: Not on file  Food Insecurity: Not on file  Transportation Needs: Not on file  Physical Activity: Not on file  Stress: Not on file  Social Connections: Not on file  Intimate Partner Violence: Not on file    Vital Signs: There were no vitals taken for this visit. There is no height or weight on  file to calculate BMI.    Examination: General Appearance: The patient is well-developed, well-nourished, and in no distress. Neck Circumference: *** Skin: Gross inspection of skin unremarkable. Head: normocephalic, no gross deformities. Eyes: no gross deformities noted. ENT: ears appear grossly normal Neurologic: Alert and oriented. No involuntary movements.    EPWORTH SLEEPINESS SCALE:  Scale:  (0)= no chance of dozing; (1)= slight chance of dozing; (2)= moderate chance of dozing; (3)= high chance of dozing  Chance  Situtation    Sitting and reading: ***    Watching TV: ***    Sitting Inactive in public: ***    As a passenger in car: ***      Lying down to rest: ***    Sitting and talking: ***    Sitting quielty after lunch: ***    In a car, stopped in traffic: ***   TOTAL   SCORE:   *** out of 24    SLEEP STUDIES:  Split 09/14/2009  -  overall AHI 85.6, min SpO2 low-mid 80s, CPAP @ 10cmh2O   CPAP COMPLIANCE DATA:  Date Range: 03/01/22  -  05/29/22  Average Daily Use: 7:23 hours  Compliance for > 4 Hours: 100% days  AHI: 2.5 respiratory events per hour  Days Used: 90/90  Mask Leak: 40.8 lpm  95th Percentile Pressure: 10 cmH2O         LABS: Recent Results (from the past 2160 hour(s))  Bayer DCA Hb A1c Waived     Status: Abnormal   Collection Time: 05/31/22  8:48 AM  Result Value Ref Range   HB A1C (BAYER DCA - WAIVED) 9.5 (H) 4.8 - 5.6 %    Comment:          Prediabetes: 5.7 - 6.4          Diabetes: >6.4          Glycemic control for adults with diabetes: <7.0   Comprehensive metabolic panel     Status: Abnormal   Collection Time: 05/31/22  8:50 AM  Result Value Ref Range   Glucose 241 (H) 70 - 99 mg/dL   BUN 14 8 - 27 mg/dL   Creatinine, Ser 1.13 0.76 - 1.27 mg/dL   eGFR 73 >59 mL/min/1.73   BUN/Creatinine Ratio 12 10 - 24   Sodium 138 134 - 144 mmol/L   Potassium 3.8 3.5 - 5.2 mmol/L   Chloride 98 96 - 106 mmol/L   CO2 27 20 -  29 mmol/L   Calcium 9.8 8.6 - 10.2 mg/dL   Total Protein 6.6 6.0 - 8.5 g/dL   Albumin 4.6 3.8 - 4.8 g/dL   Globulin, Total 2.0 1.5 - 4.5 g/dL   Albumin/Globulin Ratio 2.3 (H) 1.2 - 2.2   Bilirubin Total 0.4 0.0 - 1.2 mg/dL   Alkaline Phosphatase 65 44 - 121 IU/L   AST 12 0 - 40 IU/L   ALT 11 0 - 44 IU/L  Lipid Panel w/o Chol/HDL Ratio     Status: Abnormal   Collection Time: 05/31/22  8:50 AM  Result Value Ref Range   Cholesterol, Total 240 (H) 100 - 199 mg/dL   Triglycerides 392 (H) 0 - 149 mg/dL   HDL 42 >39 mg/dL   VLDL Cholesterol Cal 70 (H) 5 - 40 mg/dL   LDL Chol Calc (NIH) 128 (H) 0 - 99 mg/dL  CBC with Differential/Platelet     Status: Abnormal   Collection Time: 05/31/22  8:50 AM  Result Value Ref Range   WBC 5.9 3.4 - 10.8 x10E3/uL   RBC 5.35 4.14 - 5.80 x10E6/uL   Hemoglobin 15.8 13.0 - 17.7 g/dL   Hematocrit 47.2 37.5 - 51.0 %   MCV 88 79 - 97 fL   MCH 29.5 26.6 - 33.0 pg   MCHC 33.5 31.5 - 35.7 g/dL   RDW 13.3 11.6 - 15.4 %   Platelets 143 (L) 150 - 450 x10E3/uL   Neutrophils 61 Not Estab. %   Lymphs 23 Not Estab. %   Monocytes 11 Not Estab. %   Eos 4 Not Estab. %   Basos 1 Not Estab. %   Neutrophils Absolute 3.6 1.4 - 7.0 x10E3/uL   Lymphocytes Absolute 1.4 0.7 - 3.1 x10E3/uL   Monocytes Absolute 0.6 0.1 - 0.9 x10E3/uL   EOS (ABSOLUTE) 0.3 0.0 - 0.4 x10E3/uL   Basophils Absolute 0.1 0.0 - 0.2 x10E3/uL     Immature Granulocytes 0 Not Estab. %   Immature Grans (Abs) 0.0 0.0 - 0.1 x10E3/uL  Testosterone, free, total([facility])     Status: Abnormal   Collection Time: 05/31/22  8:50 AM  Result Value Ref Range   Testosterone 461 264 - 916 ng/dL    Comment: Adult male reference interval is based on a population of healthy nonobese males (BMI <30) between 41 and 6 years old. [place], [place] [phone]. PMID: [id number].    Testosterone, Free 6.2 (L) 6.6 - 18.1 pg/mL   Sex Hormone Binding 32.1 19.3 - 76.4 nmol/L  TSH     Status: Abnormal    Collection Time: 05/31/22  8:50 AM  Result Value Ref Range   TSH 11.300 (H) 0.450 - 4.500 uIU/mL    Radiology: No results found.  No results found.  No results found.    Assessment and Plan: Patient Active Problem List   Diagnosis Date Noted  . COVID-19 05/25/2021  . Encounter for commercial driving license (CDL) exam 05/69/7948  . Benign hypertensive renal disease 08/03/2015  . Hyperlipemia 08/03/2015  . Diabetes mellitus due to underlying condition with diabetic neuropathy ([place]) 08/03/2015  . Chronic kidney disease, stage II (mild) 08/03/2015  . Male hypogonadism 08/03/2015  . Hypothyroidism 08/03/2015  . Proteinuria 08/03/2015  . ED (erectile dysfunction) 08/03/2015  . OSA on CPAP 08/03/2015  . Secondary hyperparathyroidism ([place]) 08/03/2015  . Diverticulosis 08/03/2015  . Closed fracture of left scapula 11/09/2014  . Closed fracture of shaft of left clavicle 11/09/2014  . Multiple fractures of ribs of left side 11/09/2014      The patient *** tolerate PAP and reports *** benefit from PAP use. The patient was reminded how to *** and advised to ***. The patient was also counselled on ***. The compliance is ***. The AHI is ***.   ***  General Counseling: I have discussed the findings of the evaluation and examination with Rodman Key.  I have also discussed any further diagnostic evaluation thatmay be needed or ordered today. Abed verbalizes understanding of the findings of todays visit. We also reviewed his medications today and discussed drug interactions and side effects including but not limited excessive drowsiness and altered mental states. We also discussed that there is always a risk not just to him but also people around him. he has been encouraged to call the office with any questions or concerns that should arise related to todays visit.  No orders of the defined types were placed in this encounter.       I have personally obtained a history, examined the  patient, evaluated laboratory and imaging results, formulated the assessment and plan and placed orders.  Allyne Gee, MD [facility] Diplomate ABMS Pulmonary Critical Care Medicine and Sleep Medicine

## 2022-06-13 ENCOUNTER — Ambulatory Visit (INDEPENDENT_AMBULATORY_CARE_PROVIDER_SITE_OTHER): Payer: Managed Care, Other (non HMO) | Admitting: Internal Medicine

## 2022-06-13 VITALS — BP 160/89 | HR 67 | Resp 16 | Ht 72.0 in | Wt 224.0 lb

## 2022-06-13 DIAGNOSIS — Z9989 Dependence on other enabling machines and devices: Secondary | ICD-10-CM

## 2022-06-13 DIAGNOSIS — E1159 Type 2 diabetes mellitus with other circulatory complications: Secondary | ICD-10-CM

## 2022-06-13 DIAGNOSIS — Z7189 Other specified counseling: Secondary | ICD-10-CM | POA: Diagnosis not present

## 2022-06-13 DIAGNOSIS — G4733 Obstructive sleep apnea (adult) (pediatric): Secondary | ICD-10-CM | POA: Diagnosis not present

## 2022-06-13 DIAGNOSIS — I152 Hypertension secondary to endocrine disorders: Secondary | ICD-10-CM | POA: Insufficient documentation

## 2022-06-13 NOTE — Progress Notes (Signed)
[facility] [place], [place] [address]  Pulmonary Sleep Medicine   Office Visit Note  Patient Name: Manuel Burnett DOB: 03-04-1956 MRN [id number]    Chief Complaint: Obstructive Sleep Apnea visit  Brief History:  Manuel Burnett is seen today for an initial consult to establish care on CPAP@ 10cmh20.  The patient has a 13 year history of sleep apnea. Prior to use of CPAP patient reports restless sleep, loud snoring and daytime fatigue and excessive sleepiness. Patient is using PAP nightly.  The patient feels rested after sleeping with PAP.  The patient reports benefit from PAP use. Patient reports he can not go a night without therapy.Reported sleepiness is improved and the Epworth Sleepiness Score is 10 out of 24. The patient does not take naps. The patient complains of the following: no complaints with therapy.  The compliance download shows  100% compliance with an average use time of 7.23 hours. The AHI is .05  The patient does not complain of limb movements disrupting sleep. The patient's current machine is obsolete and reached end of useful life (66 years old) and should be replaced.   ROS  General: (-) fever, (-) chills, (-) night sweat Nose and Sinuses: (-) nasal stuffiness or itchiness, (-) postnasal drip, (-) nosebleeds, (-) sinus trouble. Mouth and Throat: (-) sore throat, (-) hoarseness. Neck: (-) swollen glands, (-) enlarged thyroid, (-) neck pain. Respiratory: - cough, - shortness of breath, - wheezing. Neurologic: - numbness, - tingling. Psychiatric: - anxiety, - depression   Current Medication: Outpatient Encounter Medications as of 06/13/2022  Medication Sig   albuterol (PROAIR HFA) 108 (90 Base) MCG/ACT inhaler Inhale 2 puffs into the lungs every 4 (four) hours as needed for wheezing or shortness of breath.   amLODipine (NORVASC) 10 MG tablet Take 1 tablet (10 mg total) by mouth daily.   B-D INTEGRA SYRINGE 22G X 1-1/2" 3 ML MISC Inject  testosterone 1x every 2 weeks   BD HYPODERMIC NEEDLE 18G X 1" MISC INJECT TESTOSTERONE EVERY 2 WEEKS AS DIRECTED   empagliflozin (JARDIANCE) 25 MG TABS tablet Take 1 tablet (25 mg total) by mouth daily before breakfast.   fenofibrate 160 MG tablet Take 1 tablet (160 mg total) by mouth daily. with food   glipiZIDE (GLUCOTROL) 5 MG tablet Take 1 tablet (5 mg total) by mouth 2 (two) times daily before a meal.   glucose blood test strip Use as instructed   hydrochlorothiazide (HYDRODIURIL) 25 MG tablet Take 1 tablet (25 mg total) by mouth daily.   levothyroxine (SYNTHROID) 300 MCG tablet Take 1 tablet (300 mcg total) by mouth daily before breakfast.   losartan (COZAAR) 50 MG tablet Take 1 tablet (50 mg total) by mouth daily.   metFORMIN (GLUCOPHAGE-XR) 500 MG 24 hr tablet TAKE 2 TABLETS(1000 MG) BY MOUTH IN THE MORNING AND AT BEDTIME   Multiple Vitamin (MULTIVITAMIN WITH MINERALS) TABS tablet Take 1 tablet by mouth daily.   pioglitazone (ACTOS) 30 MG tablet Take 1 tablet (30 mg total) by mouth daily.   rosuvastatin (CRESTOR) 10 MG tablet TAKE 1 TABLET(10 MG) BY MOUTH DAILY   sildenafil (VIAGRA) 100 MG tablet Take 1 tablet (100 mg total) by mouth daily as needed for erectile dysfunction.   testosterone cypionate (DEPOTESTOSTERONE CYPIONATE) 200 MG/ML injection Inject 0.5 mLs (100 mg total) into the muscle every 14 (fourteen) days.   No facility-administered encounter medications on file as of 06/13/2022.    Surgical History: Past Surgical History:  Procedure Laterality Date  CATARACT EXTRACTION W/PHACO Right 12/26/2017   Procedure: CATARACT EXTRACTION PHACO AND INTRAOCULAR LENS PLACEMENT (IOC);  Surgeon: Manuel Robson, MD;  Location: [facility] ORS;  Service: Ophthalmology;  Laterality: Right;  [place] 00:21.8 AP% 21.1 CDE 2.64 FLUID PACK LOT # [id number]   CATARACT EXTRACTION W/PHACO Left 01/09/2018   Procedure: CATARACT EXTRACTION PHACO AND INTRAOCULAR LENS PLACEMENT (IOC);  Surgeon: Manuel Robson,  MD;  Location: [facility] ORS;  Service: Ophthalmology;  Laterality: Left;  [place] 00:28.9 AP% 10.0 CDE 2.90 Fluid pcak lot # [id number]   CLAVICLE SURGERY     internal fixation   COLONOSCOPY WITH PROPOFOL N/A 04/06/2020   Procedure: COLONOSCOPY WITH PROPOFOL;  Surgeon: Manuel Landsman, MD;  Location: [facility]COPY;  Service: Gastroenterology;  Laterality: N/A;  priority 4   EYE SURGERY Bilateral 12/2017   Cataract   FRACTURE SURGERY     HEMORRHOID SURGERY     TONSILLECTOMY     TONSILLECTOMY AND ADENOIDECTOMY      Medical History: Past Medical History:  Diagnosis Date   Cancer ([place])    SKIN   Chronic kidney disease    Diabetes mellitus without complication ([place])    Dysplastic nevus 01/08/2021   right upper back biopsy proven compound dysplastic nevus with mild atypia margins involved    Erectile dysfunction    GERD (gastroesophageal reflux disease)    Hyperlipidemia    Hypertension    Hypogonadism male    Hypothyroidism    Proteinuria    Sleep apnea    CPAP   Thyroid disease     Family History: Non contributory to the present illness  Social History: Social History   Socioeconomic History   Marital status: Married    Spouse name: Not on file   Number of children: Not on file   Years of education: Not on file   Highest education level: Not on file  Occupational History   Not on file  Tobacco Use   Smoking status: Every Day    Packs/day: 0.50    Types: Cigarettes   Smokeless tobacco: Never  Vaping Use   Vaping Use: Never used  Substance and Sexual Activity   Alcohol use: Not Currently    Alcohol/week: 0.0 standard drinks of alcohol   Drug use: No   Sexual activity: Not Currently  Other Topics Concern   Not on file  Social History Narrative   Not on file   Social Determinants of Health   Financial Resource Strain: Not on file  Food Insecurity: Not on file  Transportation Needs: Not on file  Physical Activity: Not on file  Stress: Not on file  Social  Connections: Not on file  Intimate Partner Violence: Not on file    Vital Signs: Blood pressure (!) 160/89, pulse 67, resp. rate 16, height 6' (1.829 m), weight 224 lb (101.6 kg), SpO2 97 %. Body mass index is 30.38 kg/m.    Examination: General Appearance: The patient is well-developed, well-nourished, and in no distress. Neck Circumference: 46cm Skin: Gross inspection of skin unremarkable. Head: normocephalic, no gross deformities. Eyes: no gross deformities noted. ENT: ears appear grossly normal Neurologic: Alert and oriented. No involuntary movements.    EPWORTH SLEEPINESS SCALE:  Scale:  (0)= no chance of dozing; (1)= slight chance of dozing; (2)= moderate chance of dozing; (3)= high chance of dozing  Chance  Situtation    Sitting and reading: 2    Watching TV: 1    Sitting Inactive in public: 1    As a passenger  in car: 0      Lying down to rest: 3    Sitting and talking: 0    Sitting quielty after lunch: 3    In a car, stopped in traffic: 0   TOTAL SCORE:   10 out of 24    SLEEP STUDIES:  Split 09/14/2009  -  overall AHI 85.6, min SpO2 low-mid 80s, CPAP @ 10cmh2O   CPAP COMPLIANCE DATA:  Date Range: 03/01/22-05/29/22  Average Daily Use: 7.23 hours  Median Use: 7.09 hr  Compliance for > 4 Hours: 100% days  AHI: 0.5 respiratory events per hour  Days Used: 90/90  Mask Leak: 16.2  95th Percentile Pressure: 40.8         LABS: Recent Results (from the past 2160 hour(s))  Bayer DCA Hb A1c Waived     Status: Abnormal   Collection Time: 05/31/22  8:48 AM  Result Value Ref Range   HB A1C (BAYER DCA - WAIVED) 9.5 (H) 4.8 - 5.6 %    Comment:          Prediabetes: 5.7 - 6.4          Diabetes: >6.4          Glycemic control for adults with diabetes: <7.0   Comprehensive metabolic panel     Status: Abnormal   Collection Time: 05/31/22  8:50 AM  Result Value Ref Range   Glucose 241 (H) 70 - 99 mg/dL   BUN 14 8 - 27 mg/dL   Creatinine,  Ser 1.13 0.76 - 1.27 mg/dL   eGFR 73 >59 mL/min/1.73   BUN/Creatinine Ratio 12 10 - 24   Sodium 138 134 - 144 mmol/L   Potassium 3.8 3.5 - 5.2 mmol/L   Chloride 98 96 - 106 mmol/L   CO2 27 20 - 29 mmol/L   Calcium 9.8 8.6 - 10.2 mg/dL   Total Protein 6.6 6.0 - 8.5 g/dL   Albumin 4.6 3.8 - 4.8 g/dL   Globulin, Total 2.0 1.5 - 4.5 g/dL   Albumin/Globulin Ratio 2.3 (H) 1.2 - 2.2   Bilirubin Total 0.4 0.0 - 1.2 mg/dL   Alkaline Phosphatase 65 44 - 121 IU/L   AST 12 0 - 40 IU/L   ALT 11 0 - 44 IU/L  Lipid Panel w/o Chol/HDL Ratio     Status: Abnormal   Collection Time: 05/31/22  8:50 AM  Result Value Ref Range   Cholesterol, Total 240 (H) 100 - 199 mg/dL   Triglycerides 392 (H) 0 - 149 mg/dL   HDL 42 >39 mg/dL   VLDL Cholesterol Cal 70 (H) 5 - 40 mg/dL   LDL Chol Calc (NIH) 128 (H) 0 - 99 mg/dL  CBC with Differential/Platelet     Status: Abnormal   Collection Time: 05/31/22  8:50 AM  Result Value Ref Range   WBC 5.9 3.4 - 10.8 x10E3/uL   RBC 5.35 4.14 - 5.80 x10E6/uL   Hemoglobin 15.8 13.0 - 17.7 g/dL   Hematocrit 47.2 37.5 - 51.0 %   MCV 88 79 - 97 fL   MCH 29.5 26.6 - 33.0 pg   MCHC 33.5 31.5 - 35.7 g/dL   RDW 13.3 11.6 - 15.4 %   Platelets 143 (L) 150 - 450 x10E3/uL   Neutrophils 61 Not Estab. %   Lymphs 23 Not Estab. %   Monocytes 11 Not Estab. %   Eos 4 Not Estab. %   Basos 1 Not Estab. %   Neutrophils Absolute 3.6 1.4 -  7.0 x10E3/uL   Lymphocytes Absolute 1.4 0.7 - 3.1 x10E3/uL   Monocytes Absolute 0.6 0.1 - 0.9 x10E3/uL   EOS (ABSOLUTE) 0.3 0.0 - 0.4 x10E3/uL   Basophils Absolute 0.1 0.0 - 0.2 x10E3/uL   Immature Granulocytes 0 Not Estab. %   Immature Grans (Abs) 0.0 0.0 - 0.1 x10E3/uL  Testosterone, free, total([facility]/Sunquest)     Status: Abnormal   Collection Time: 05/31/22  8:50 AM  Result Value Ref Range   Testosterone 461 264 - 916 ng/dL    Comment: Adult male reference interval is based on a population of healthy nonobese males (BMI <30) between 50 and  64 years old. [place], [place] [phone]. PMID: 45809983.    Testosterone, Free 6.2 (L) 6.6 - 18.1 pg/mL   Sex Hormone Binding 32.1 19.3 - 76.4 nmol/L  TSH     Status: Abnormal   Collection Time: 05/31/22  8:50 AM  Result Value Ref Range   TSH 11.300 (H) 0.450 - 4.500 uIU/mL    Radiology: No results found.  No results found.  No results found.    Assessment and Plan: Patient Active Problem List   Diagnosis Date Noted   CPAP use counseling 06/13/2022   Hypertension associated with diabetes ([place]) 06/13/2022   COVID-19 05/25/2021   Encounter for commercial driving license (CDL) exam 38/25/0539   Benign hypertensive renal disease 08/03/2015   Hyperlipemia 08/03/2015   Diabetes mellitus due to underlying condition with diabetic neuropathy ([place]) 08/03/2015   Chronic kidney disease, stage II (mild) 08/03/2015   Male hypogonadism 08/03/2015   Hypothyroidism 08/03/2015   Proteinuria 08/03/2015   ED (erectile dysfunction) 08/03/2015   OSA on CPAP 08/03/2015   Secondary hyperparathyroidism ([place]) 08/03/2015   Diverticulosis 08/03/2015   Closed fracture of left scapula 11/09/2014   Closed fracture of shaft of left clavicle 11/09/2014   Multiple fractures of ribs of left side 11/09/2014   1. OSA on CPAP The patient does tolerate PAP and reports  benefit from PAP use. Machine is past end of life and must be replaced. The patient was reminded how to clean equipment and advised to replace supplies routinely. The patient was also counselled on weight loss and smoking cessation. . The compliance is excellent. The AHI is 0.5.   OSA on cpap- CPAP continues to be medically necessary to treat this patient's OSA.  Replace machine. F/u 30d after set up.    2. CPAP use counseling CPAP Counseling: had a lengthy discussion with the patient regarding the importance of PAP therapy in management of the sleep apnea. Patient appears to understand the risk factor reduction and also  understands the risks associated with untreated sleep apnea. Patient will try to make a good faith effort to remain compliant with therapy. Also instructed the patient on proper cleaning of the device including the water must be changed daily if possible and use of distilled water is preferred. Patient understands that the machine should be regularly cleaned with appropriate recommended cleaning solutions that do not damage the PAP machine for example given white vinegar and water rinses. Other methods such as ozone treatment may not be as good as these simple methods to achieve cleaning.   3. Hypertension associated with diabetes ([place]) We discussed today's bp reading. F/u with pcp.  Hypertension Counseling:   The following hypertensive lifestyle modification were recommended and discussed:  1. Limiting alcohol intake to less than 1 oz/day of ethanol:(24 oz of beer or 8 oz of wine or 2 oz of 100-proof whiskey).  2. Take baby ASA 81 mg daily. 3. Importance of regular aerobic exercise and losing weight. 4. Reduce dietary saturated fat and cholesterol intake for overall cardiovascular health. 5. Maintaining adequate dietary potassium, calcium, and magnesium intake. 6. Regular monitoring of the blood pressure. 7. Reduce sodium intake to less than 100 mmol/day (less than 2.3 gm of sodium or less than 6 gm of sodium choride)      General Counseling: I have discussed the findings of the evaluation and examination with Rodman Key.  I have also discussed any further diagnostic evaluation thatmay be needed or ordered today. Edel verbalizes understanding of the findings of todays visit. We also reviewed his medications today and discussed drug interactions and side effects including but not limited excessive drowsiness and altered mental states. We also discussed that there is always a risk not just to him but also people around him. he has been encouraged to call the office with any questions or concerns that  should arise related to todays visit.  No orders of the defined types were placed in this encounter.       I have personally obtained a history, examined the patient, evaluated laboratory and imaging results, formulated the assessment and plan and placed orders. This patient was seen today by Tressie Ellis, PA-C in collaboration with Dr. Devona Konig.   Allyne Gee, MD [employer] Diplomate ABMS Pulmonary Critical Care Medicine and Sleep Medicine

## 2022-06-14 ENCOUNTER — Encounter: Payer: Self-pay | Admitting: Family Medicine

## 2022-06-15 NOTE — Telephone Encounter (Signed)
Can we see what the issue is with the pharmacy?

## 2022-06-22 ENCOUNTER — Encounter: Payer: Self-pay | Admitting: Family Medicine

## 2022-06-23 ENCOUNTER — Telehealth: Payer: Self-pay | Admitting: Family Medicine

## 2022-06-23 ENCOUNTER — Other Ambulatory Visit: Payer: Self-pay

## 2022-06-23 MED ORDER — ACCU-CHEK AVIVA PLUS VI STRP: ORAL_STRIP | 3 refills | Status: DC

## 2022-06-23 MED ORDER — ACCU-CHEK AVIVA PLUS VI STRP: 1.0000 | ORAL_STRIP | Freq: Every day | 3 refills | Status: DC

## 2022-06-23 NOTE — Telephone Encounter (Signed)
RX t'd up to be sent with the correct directions.

## 2022-06-23 NOTE — Telephone Encounter (Signed)
[facility] called to see how many times the pt is to test per day with the glucose blood (ACCU-CHEK AVIVA PLUS) test strip / please advise

## 2022-06-24 ENCOUNTER — Other Ambulatory Visit: Payer: Self-pay

## 2022-06-24 MED ORDER — ACCU-CHEK GUIDE VI STRP: ORAL_STRIP | 12 refills | Status: DC

## 2022-06-24 NOTE — Telephone Encounter (Signed)
Copied from [place]. Topic: General - Other >> Jun 24, 2022  4:20 PM Ja-Kwan M wrote: Reason for CRM: Pt stated the wrong Rx was sent in for his test strips. Pt requests that Rx for Accu Chek Guide be sent to his pharmacy.   New RX started. Routing to provider to send in.

## 2022-07-05 ENCOUNTER — Ambulatory Visit: Payer: Managed Care, Other (non HMO) | Admitting: Family Medicine

## 2022-07-05 ENCOUNTER — Encounter: Payer: Self-pay | Admitting: Family Medicine

## 2022-07-05 DIAGNOSIS — E0840 Diabetes mellitus due to underlying condition with diabetic neuropathy, unspecified: Secondary | ICD-10-CM | POA: Diagnosis not present

## 2022-07-05 DIAGNOSIS — E1140 Type 2 diabetes mellitus with diabetic neuropathy, unspecified: Secondary | ICD-10-CM

## 2022-07-05 MED ORDER — GLIPIZIDE 5 MG PO TABS: 5.0000 mg | ORAL_TABLET | Freq: Two times a day (BID) | ORAL | 1 refills | Status: DC

## 2022-07-05 NOTE — Progress Notes (Signed)
BP (!) 149/76   Pulse 66   Temp 97.9 F (36.6 C)   Wt 221 lb 9.6 oz (100.5 kg)   SpO2 97%   BMI 30.05 kg/m    Subjective:    Patient ID: Manuel Burnett, male    DOB: [birth year], 66 y.o.   MRN: [id number]  HPI: Manuel Burnett is a 66 y.o. male  Chief Complaint  Patient presents with   Diabetes    Patient states he is doing well on glipizide, states his sugars have been running between 104-120's.    DIABETES Hypoglycemic episodes:no Polydipsia/polyuria: no Visual disturbance: no Chest pain: no Paresthesias: no Glucose Monitoring: yes  Accucheck frequency: Daily  Fasting glucose: 104-120 Taking Insulin?: no Blood Pressure Monitoring: not checking Retinal Examination: Up to Date Foot Exam: Up to Date Diabetic Education: Completed Pneumovax: Up to Date Influenza: Up to Date Aspirin: yes  Relevant past medical, surgical, family and social history reviewed and updated as indicated. Interim medical history since our last visit reviewed. Allergies and medications reviewed and updated.  Review of Systems  Constitutional: Negative.   Respiratory: Negative.    Cardiovascular: Negative.   Gastrointestinal: Negative.   Musculoskeletal: Negative.   Neurological: Negative.   Psychiatric/Behavioral: Negative.      Per HPI unless specifically indicated above     Objective:    BP (!) 149/76   Pulse 66   Temp 97.9 F (36.6 C)   Wt 221 lb 9.6 oz (100.5 kg)   SpO2 97%   BMI 30.05 kg/m   Wt Readings from Last 3 Encounters:  07/05/22 221 lb 9.6 oz (100.5 kg)  06/13/22 224 lb (101.6 kg)  05/31/22 220 lb 6.4 oz (100 kg)    Physical Exam Vitals and nursing note reviewed.  Constitutional:      General: He is not in acute distress.    Appearance: Normal appearance. He is not ill-appearing, toxic-appearing or diaphoretic.  HENT:     Head: Normocephalic and atraumatic.     Right Ear: External ear normal.     Left Ear: External ear normal.     Nose: Nose normal.      Mouth/Throat:     Mouth: Mucous membranes are moist.     Pharynx: Oropharynx is clear.  Eyes:     General: No scleral icterus.       Right eye: No discharge.        Left eye: No discharge.     Extraocular Movements: Extraocular movements intact.     Conjunctiva/sclera: Conjunctivae normal.     Pupils: Pupils are equal, round, and reactive to light.  Cardiovascular:     Rate and Rhythm: Normal rate and regular rhythm.     Pulses: Normal pulses.     Heart sounds: Normal heart sounds. No murmur heard.    No friction rub. No gallop.  Pulmonary:     Effort: Pulmonary effort is normal. No respiratory distress.     Breath sounds: Normal breath sounds. No stridor. No wheezing, rhonchi or rales.  Chest:     Chest wall: No tenderness.  Musculoskeletal:        General: Normal range of motion.     Cervical back: Normal range of motion and neck supple.  Skin:    General: Skin is warm and dry.     Capillary Refill: Capillary refill takes less than 2 seconds.     Coloration: Skin is not jaundiced or pale.     Findings: No bruising,  erythema, lesion or rash.  Neurological:     General: No focal deficit present.     Mental Status: He is alert and oriented to person, place, and time. Mental status is at baseline.  Psychiatric:        Mood and Affect: Mood normal.        Behavior: Behavior normal.        Thought Content: Thought content normal.        Judgment: Judgment normal.     Results for orders placed or performed in visit on 05/31/22  Comprehensive metabolic panel  Result Value Ref Range   Glucose 241 (H) 70 - 99 mg/dL   BUN 14 8 - 27 mg/dL   Creatinine, Ser 1.13 0.76 - 1.27 mg/dL   eGFR 73 >59 mL/min/1.73   BUN/Creatinine Ratio 12 10 - 24   Sodium 138 134 - 144 mmol/L   Potassium 3.8 3.5 - 5.2 mmol/L   Chloride 98 96 - 106 mmol/L   CO2 27 20 - 29 mmol/L   Calcium 9.8 8.6 - 10.2 mg/dL   Total Protein 6.6 6.0 - 8.5 g/dL   Albumin 4.6 3.8 - 4.8 g/dL   Globulin, Total 2.0  1.5 - 4.5 g/dL   Albumin/Globulin Ratio 2.3 (H) 1.2 - 2.2   Bilirubin Total 0.4 0.0 - 1.2 mg/dL   Alkaline Phosphatase 65 44 - 121 IU/L   AST 12 0 - 40 IU/L   ALT 11 0 - 44 IU/L  Bayer DCA Hb A1c Waived  Result Value Ref Range   HB A1C (BAYER DCA - WAIVED) 9.5 (H) 4.8 - 5.6 %  Lipid Panel w/o Chol/HDL Ratio  Result Value Ref Range   Cholesterol, Total 240 (H) 100 - 199 mg/dL   Triglycerides 392 (H) 0 - 149 mg/dL   HDL 42 >39 mg/dL   VLDL Cholesterol Cal 70 (H) 5 - 40 mg/dL   LDL Chol Calc (NIH) 128 (H) 0 - 99 mg/dL  CBC with Differential/Platelet  Result Value Ref Range   WBC 5.9 3.4 - 10.8 x10E3/uL   RBC 5.35 4.14 - 5.80 x10E6/uL   Hemoglobin 15.8 13.0 - 17.7 g/dL   Hematocrit 47.2 37.5 - 51.0 %   MCV 88 79 - 97 fL   MCH 29.5 26.6 - 33.0 pg   MCHC 33.5 31.5 - 35.7 g/dL   RDW 13.3 11.6 - 15.4 %   Platelets 143 (L) 150 - 450 x10E3/uL   Neutrophils 61 Not Estab. %   Lymphs 23 Not Estab. %   Monocytes 11 Not Estab. %   Eos 4 Not Estab. %   Basos 1 Not Estab. %   Neutrophils Absolute 3.6 1.4 - 7.0 x10E3/uL   Lymphocytes Absolute 1.4 0.7 - 3.1 x10E3/uL   Monocytes Absolute 0.6 0.1 - 0.9 x10E3/uL   EOS (ABSOLUTE) 0.3 0.0 - 0.4 x10E3/uL   Basophils Absolute 0.1 0.0 - 0.2 x10E3/uL   Immature Granulocytes 0 Not Estab. %   Immature Grans (Abs) 0.0 0.0 - 0.1 x10E3/uL  Testosterone, free, total([facility])  Result Value Ref Range   Testosterone 461 264 - 916 ng/dL   Testosterone, Free 6.2 (L) 6.6 - 18.1 pg/mL   Sex Hormone Binding 32.1 19.3 - 76.4 nmol/L  TSH  Result Value Ref Range   TSH 11.300 (H) 0.450 - 4.500 uIU/mL      Assessment & Plan:   Problem List Items Addressed This Visit       Endocrine   Diabetes mellitus due  to underlying condition with diabetic neuropathy ([place])    Tolerating his medicine well. Sugars doing much better. Will continue current regimen and recheck A1c in 2 months. Call with any concerns.       Relevant Medications   glipiZIDE  (GLUCOTROL) 5 MG tablet     Follow up plan: Return in about 2 months (around 09/05/2022).

## 2022-07-05 NOTE — Assessment & Plan Note (Signed)
Tolerating his medicine well. Sugars doing much better. Will continue current regimen and recheck A1c in 2 months. Call with any concerns.

## 2022-08-24 ENCOUNTER — Other Ambulatory Visit: Payer: Self-pay | Admitting: Family Medicine

## 2022-08-25 NOTE — Telephone Encounter (Signed)
Requested Prescriptions  Pending Prescriptions Disp Refills  . levothyroxine (SYNTHROID) 300 MCG tablet [Pharmacy Med Name: LEVOTHYROXINE 0.'3MG'$  (300MCG) TAB] 90 tablet 0    Sig: TAKE 1 TABLET(300 MCG) BY MOUTH DAILY BEFORE BREAKFAST     Endocrinology:  Hypothyroid Agents Failed - 08/24/2022  7:00 AM      Failed - TSH in normal range and within 360 days    TSH  Date Value Ref Range Status  05/31/2022 11.300 (H) 0.450 - 4.500 uIU/mL Final         Passed - Valid encounter within last 12 months    Recent Outpatient Visits          1 month ago Diabetes mellitus due to underlying condition with diabetic neuropathy, without long-term current use of insulin ([place])   [place], Megan P, DO   2 months ago Diabetes mellitus due to underlying condition with diabetic neuropathy, without long-term current use of insulin ([place])   [place], Megan P, DO   4 months ago Acute non-recurrent maxillary sinusitis   [facility] [place], Megan P, DO   6 months ago Diabetes mellitus due to underlying condition with diabetic neuropathy, without long-term current use of insulin ([place])   [place], Megan P, DO   9 months ago Routine general medical examination at a health care facility   [place], [place], DO      Future Appointments            In 1 week Wynetta Emery, Barb Merino, DO [facility]

## 2022-09-01 NOTE — Progress Notes (Unsigned)
[facility] [place], [place] [address]  Pulmonary Sleep Medicine   Office Visit Note  Patient Name: Manuel Burnett DOB: [birth year] MRN [id number]    Chief Complaint: Obstructive Sleep Apnea visit  Brief History:  Manuel Burnett is seen today for a 64 month follow up after new setup on CPAP at 10 cmh20.  The patient has a 66 year history of sleep apnea. Patient is using PAP nightly.  The patient feels rested after sleeping with PAP.  The patient reports benefiting from PAP use. Reported sleepiness is  improved and the Epworth Sleepiness Score is 0 out of 24. The patient does not take naps. The patient complains of the following: No complaints.  The compliance download shows 100% compliance with an average use time of 7 hours 20 minutes. The AHI is 1.3.  The patient does not complain of limb movements disrupting sleep.  ROS  General: (-) fever, (-) chills, (-) night sweat Nose and Sinuses: (-) nasal stuffiness or itchiness, (-) postnasal drip, (-) nosebleeds, (-) sinus trouble. Mouth and Throat: (-) sore throat, (-) hoarseness. Neck: (-) swollen glands, (-) enlarged thyroid, (-) neck pain. Respiratory: - cough, - shortness of breath, - wheezing. Neurologic: - numbness, - tingling. Psychiatric: - anxiety, - depression   Current Medication: Outpatient Encounter Medications as of 09/05/2022  Medication Sig   albuterol (PROAIR HFA) 108 (90 Base) MCG/ACT inhaler Inhale 2 puffs into the lungs every 4 (four) hours as needed for wheezing or shortness of breath.   amLODipine (NORVASC) 10 MG tablet Take 1 tablet (10 mg total) by mouth daily.   B-D INTEGRA SYRINGE 22G X 1-1/2" 3 ML MISC Inject testosterone 1x every 2 weeks   BD HYPODERMIC NEEDLE 18G X 1" MISC INJECT TESTOSTERONE EVERY 2 WEEKS AS DIRECTED   empagliflozin (JARDIANCE) 25 MG TABS tablet Take 1 tablet (25 mg total) by mouth daily before breakfast.   fenofibrate 160 MG tablet Take 1 tablet (160 mg total) by  mouth daily. with food   glipiZIDE (GLUCOTROL) 5 MG tablet Take 1 tablet (5 mg total) by mouth 2 (two) times daily before a meal.   glucose blood (ACCU-CHEK GUIDE) test strip Use as instructed   hydrochlorothiazide (HYDRODIURIL) 25 MG tablet Take 1 tablet (25 mg total) by mouth daily.   levothyroxine (SYNTHROID) 300 MCG tablet TAKE 1 TABLET(300 MCG) BY MOUTH DAILY BEFORE BREAKFAST   losartan (COZAAR) 50 MG tablet Take 1 tablet (50 mg total) by mouth daily.   metFORMIN (GLUCOPHAGE-XR) 500 MG 24 hr tablet TAKE 2 TABLETS(1000 MG) BY MOUTH IN THE MORNING AND AT BEDTIME   Multiple Vitamin (MULTIVITAMIN WITH MINERALS) TABS tablet Take 1 tablet by mouth daily.   pioglitazone (ACTOS) 30 MG tablet Take 1 tablet (30 mg total) by mouth daily.   rosuvastatin (CRESTOR) 10 MG tablet TAKE 1 TABLET(10 MG) BY MOUTH DAILY   sildenafil (VIAGRA) 100 MG tablet Take 1 tablet (100 mg total) by mouth daily as needed for erectile dysfunction.   testosterone cypionate (DEPOTESTOSTERONE CYPIONATE) 200 MG/ML injection Inject 0.5 mLs (100 mg total) into the muscle every 14 (fourteen) days.   No facility-administered encounter medications on file as of 09/05/2022.    Surgical History: Past Surgical History:  Procedure Laterality Date   CATARACT EXTRACTION W/PHACO Right 12/26/2017   Procedure: CATARACT EXTRACTION PHACO AND INTRAOCULAR LENS PLACEMENT (IOC);  Surgeon: Birder Robson, MD;  Location: [facility] ORS;  Service: Ophthalmology;  Laterality: Right;  [place] 00:21.8 AP% 21.1 CDE 2.64 FLUID PACK  LOT # [id number]   CATARACT EXTRACTION W/PHACO Left 01/09/2018   Procedure: CATARACT EXTRACTION PHACO AND INTRAOCULAR LENS PLACEMENT (IOC);  Surgeon: Birder Robson, MD;  Location: [facility] ORS;  Service: Ophthalmology;  Laterality: Left;  [place] 00:28.9 AP% 10.0 CDE 2.90 Fluid pcak lot # [id number]   CLAVICLE SURGERY     internal fixation   COLONOSCOPY WITH PROPOFOL N/A 04/06/2020   Procedure: COLONOSCOPY WITH PROPOFOL;  Surgeon: Lin Landsman, MD;  [facility]: [facility] ENDOSCOPY;  Service: Gastroenterology;  Laterality: N/A;  priority 4   EYE SURGERY Bilateral 12/2017   Cataract   FRACTURE SURGERY     HEMORRHOID SURGERY     TONSILLECTOMY     TONSILLECTOMY AND ADENOIDECTOMY      Medical History: Past Medical History:  Diagnosis Date   Cancer ([place])    SKIN   Chronic kidney disease    Diabetes mellitus without complication ([place])    Dysplastic nevus 01/08/2021   right upper back biopsy proven compound dysplastic nevus with mild atypia margins involved    Erectile dysfunction    GERD (gastroesophageal reflux disease)    Hyperlipidemia    Hypertension    Hypogonadism male    Hypothyroidism    Proteinuria    Sleep apnea    CPAP   Thyroid disease     Family History: Non contributory to the present illness  Social History: Social History   Socioeconomic History   Marital status: Married    Spouse name: Not on file   Number of children: Not on file   Years of education: Not on file   Highest education level: Not on file  Occupational History   Not on file  Tobacco Use   Smoking status: Every Day    Packs/day: 0.50    Types: Cigarettes   Smokeless tobacco: Never  Vaping Use   Vaping Use: Never used  Substance and Sexual Activity   Alcohol use: Not Currently    Alcohol/week: 0.0 standard drinks of alcohol   Drug use: No   Sexual activity: Not Currently  Other Topics Concern   Not on file  Social History Narrative   Not on file   Social Determinants of Health   Financial Resource Strain: Not on file  Food Insecurity: Not on file  Transportation Needs: Not on file  Physical Activity: Not on file  Stress: Not on file  Social Connections: Not on file  Intimate Partner Violence: Not on file    Vital Signs: Blood pressure (!) 162/88, pulse 73, resp. rate 16, height 6' (1.829 m), weight 225 lb (102.1 kg), SpO2 97 %. Body mass index is 30.52 kg/m.    Examination: General Appearance: The  patient is well-developed, well-nourished, and in no distress. Neck Circumference: 43 cm Skin: Gross inspection of skin unremarkable. Head: normocephalic, no gross deformities. Eyes: no gross deformities noted. ENT: ears appear grossly normal Neurologic: Alert and oriented. No involuntary movements.  STOP BANG RISK ASSESSMENT S (snore) Have you been told that you snore?     NO   T (tired) Are you often tired, fatigued, or sleepy during the day?   NO  O (obstruction) Do you stop breathing, choke, or gasp during sleep? NO   P (pressure) Do you have or are you being treated for high blood pressure? YES   B (BMI) Is your body index greater than 35 kg/m? NO   A (age) Are you 64 years old or older? YES   N (neck) Do you have a  neck circumference greater than 16 inches?   YES   G (gender) Are you a male? YES   TOTAL STOP/BANG "YES" ANSWERS 4       A STOP-Bang score of 2 or less is considered low risk, and a score of 5 or more is high risk for having either moderate or severe OSA. For people who score 3 or 4, doctors may need to perform further assessment to determine how likely they are to have OSA.         EPWORTH SLEEPINESS SCALE:  Scale:  (0)= no chance of dozing; (1)= slight chance of dozing; (2)= moderate chance of dozing; (3)= high chance of dozing  Chance  Situtation    Sitting and reading: 0    Watching TV: 0    Sitting Inactive in public: 0    As a passenger in car: 0      Lying down to rest: 0    Sitting and talking: 0    Sitting quielty after lunch: 0    In a car, stopped in traffic: 0   TOTAL SCORE:   0 out of 24    SLEEP STUDIES:  SPLIT (09/14/09)  AHI  85.6, SPO2  79%, CPAP AT 10 CMH20   CPAP COMPLIANCE DATA:  Date Range: 06/30/22 - 08/31/22  Average Daily Use: 7 hours 20 minutes  Median Use: 7 hours 18 minutes  Compliance for > 4 Hours: 63 days  AHI: 1.3 respiratory events per hour  Days Used: 63/63  Mask Leak: 31.5  95th  Percentile Pressure: 10 cmh20         LABS: Recent Results (from the past 2160 hour(s))  Bayer DCA Hb A1c Waived     Status: Abnormal   Collection Time: 09/05/22  8:28 AM  Result Value Ref Range   HB A1C (BAYER DCA - WAIVED) 6.6 (H) 4.8 - 5.6 %    Comment:          Prediabetes: 5.7 - 6.4          Diabetes: >6.4          Glycemic control for adults with diabetes: <7.0     Radiology: No results found.  No results found.  No results found.    Assessment and Plan: Patient Active Problem List   Diagnosis Date Noted   CPAP use counseling 06/13/2022   Hypertension associated with diabetes ([place]) 06/13/2022   COVID-19 05/25/2021   Encounter for commercial driving license (CDL) exam 60/63/0160   Benign hypertensive renal disease 08/03/2015   Hyperlipemia 08/03/2015   Diabetes mellitus due to underlying condition with diabetic neuropathy ([place]) 08/03/2015   Chronic kidney disease, stage II (mild) 08/03/2015   Male hypogonadism 08/03/2015   Hypothyroidism 08/03/2015   Proteinuria 08/03/2015   ED (erectile dysfunction) 08/03/2015   OSA on CPAP 08/03/2015   Secondary hyperparathyroidism ([place]) 08/03/2015   Diverticulosis 08/03/2015   Closed fracture of left scapula 11/09/2014   Closed fracture of shaft of left clavicle 11/09/2014   Multiple fractures of ribs of left side 11/09/2014    1. OSA on CPAP The patient does tolerate PAP and reports  benefit from PAP use. The patient was reminded how to clean equipment and advised to replace supplies routinely. The patient was also counselled on weight loss. The compliance is excellent. The AHI is 1.3.   OSA on cpap- CPAP continues to be medically necessary to treat this patient's OSA.  Continue with excellent compliance. F/u one year.   2.  CPAP use counseling CPAP Counseling: had a lengthy discussion with the patient regarding the importance of PAP therapy in management of the sleep apnea. Patient appears to understand the risk  factor reduction and also understands the risks associated with untreated sleep apnea. Patient will try to make a good faith effort to remain compliant with therapy. Also instructed the patient on proper cleaning of the device including the water must be changed daily if possible and use of distilled water is preferred. Patient understands that the machine should be regularly cleaned with appropriate recommended cleaning solutions that do not damage the PAP machine for example given white vinegar and water rinses. Other methods such as ozone treatment may not be as good as these simple methods to achieve cleaning.   3. Hypertension associated with diabetes ([place]) Hypertension Counseling:   The following hypertensive lifestyle modification were recommended and discussed:  1. Limiting alcohol intake to less than 1 oz/day of ethanol:(24 oz of beer or 8 oz of wine or 2 oz of 100-proof whiskey). 2. Take baby ASA 81 mg daily. 3. Importance of regular aerobic exercise and losing weight. 4. Reduce dietary saturated fat and cholesterol intake for overall cardiovascular health. 5. Maintaining adequate dietary potassium, calcium, and magnesium intake. 6. Regular monitoring of the blood pressure. 7. Reduce sodium intake to less than 100 mmol/day (less than 2.3 gm of sodium or less than 6 gm of sodium choride)     General Counseling: I have discussed the findings of the evaluation and examination with Rodman Key.  I have also discussed any further diagnostic evaluation thatmay be needed or ordered today. Estuardo verbalizes understanding of the findings of todays visit. We also reviewed his medications today and discussed drug interactions and side effects including but not limited excessive drowsiness and altered mental states. We also discussed that there is always a risk not just to him but also people around him. he has been encouraged to call the office with any questions or concerns that should arise related to  todays visit.  No orders of the defined types were placed in this encounter.       I have personally obtained a history, examined the patient, evaluated laboratory and imaging results, formulated the assessment and plan and placed orders. This patient was seen today by Tressie Ellis, PA-C in collaboration with Dr. Devona Konig.   Allyne Gee, MD [facility] Diplomate ABMS Pulmonary Critical Care Medicine and Sleep Medicine

## 2022-09-05 ENCOUNTER — Ambulatory Visit: Payer: Managed Care, Other (non HMO) | Admitting: Family Medicine

## 2022-09-05 ENCOUNTER — Ambulatory Visit (INDEPENDENT_AMBULATORY_CARE_PROVIDER_SITE_OTHER): Payer: Managed Care, Other (non HMO) | Admitting: Internal Medicine

## 2022-09-05 ENCOUNTER — Encounter: Payer: Self-pay | Admitting: Family Medicine

## 2022-09-05 VITALS — BP 134/70 | HR 64 | Temp 97.9°F | Wt 225.2 lb

## 2022-09-05 VITALS — BP 162/88 | HR 73 | Resp 16 | Ht 72.0 in | Wt 225.0 lb

## 2022-09-05 DIAGNOSIS — Z7189 Other specified counseling: Secondary | ICD-10-CM

## 2022-09-05 DIAGNOSIS — E0840 Diabetes mellitus due to underlying condition with diabetic neuropathy, unspecified: Secondary | ICD-10-CM

## 2022-09-05 DIAGNOSIS — E1159 Type 2 diabetes mellitus with other circulatory complications: Secondary | ICD-10-CM

## 2022-09-05 DIAGNOSIS — G4733 Obstructive sleep apnea (adult) (pediatric): Secondary | ICD-10-CM | POA: Diagnosis not present

## 2022-09-05 DIAGNOSIS — E039 Hypothyroidism, unspecified: Secondary | ICD-10-CM

## 2022-09-05 DIAGNOSIS — I152 Hypertension secondary to endocrine disorders: Secondary | ICD-10-CM

## 2022-09-05 DIAGNOSIS — Z9989 Dependence on other enabling machines and devices: Secondary | ICD-10-CM

## 2022-09-05 LAB — BAYER DCA HB A1C WAIVED: HB A1C (BAYER DCA - WAIVED): 6.6 % — ABNORMAL HIGH (ref 4.8–5.6)

## 2022-09-05 NOTE — Assessment & Plan Note (Signed)
Rechecking labs today. Await results. Treat as needed.  °

## 2022-09-05 NOTE — Assessment & Plan Note (Signed)
Doing great with A1c of 6.6. Continue current regimen. Continue to monitor. Call with any concerns.

## 2022-09-05 NOTE — Progress Notes (Signed)
BP 134/70   Pulse 64   Temp 97.9 F (36.6 C)   Wt 225 lb 3.2 oz (102.2 kg)   SpO2 98%   BMI 30.54 kg/m    Subjective:    Patient ID: Manuel Burnett, male    DOB: 02/25/1956, 66 y.o.   MRN: [id number]  HPI: CARVELL HOEFFNER is a 66 y.o. male  Chief Complaint  Patient presents with   Diabetes   Hypothyroidism   DIABETES Hypoglycemic episodes:no Polydipsia/polyuria: no Visual disturbance: no Chest pain: no Paresthesias: no Glucose Monitoring: yes  Accucheck frequency: Daily  Fasting glucose: 90s Taking Insulin?: no Blood Pressure Monitoring: not checking Retinal Examination: Up to Date Foot Exam: Up to Date Diabetic Education: Completed Pneumovax: Up to Date Influenza: Not up to Date Aspirin: yes  HYPOTHYROIDISM Thyroid control status:stable Satisfied with current treatment? yes Medication side effects: no Medication compliance: excellent compliance Recent dose adjustment:yes Fatigue: no Cold intolerance: no Heat intolerance: no Weight gain: no Weight loss: no Constipation: no Diarrhea/loose stools: no Palpitations: no Lower extremity edema: no Anxiety/depressed mood: no  Relevant past medical, surgical, family and social history reviewed and updated as indicated. Interim medical history since our last visit reviewed. Allergies and medications reviewed and updated.  Review of Systems  Constitutional: Negative.   Respiratory: Negative.    Cardiovascular: Negative.   Gastrointestinal: Negative.   Musculoskeletal: Negative.   Neurological: Negative.   Psychiatric/Behavioral: Negative.      Per HPI unless specifically indicated above     Objective:    BP 134/70   Pulse 64   Temp 97.9 F (36.6 C)   Wt 225 lb 3.2 oz (102.2 kg)   SpO2 98%   BMI 30.54 kg/m   Wt Readings from Last 3 Encounters:  09/05/22 225 lb 3.2 oz (102.2 kg)  07/05/22 221 lb 9.6 oz (100.5 kg)  06/13/22 224 lb (101.6 kg)    Physical Exam Vitals and nursing note  reviewed.  Constitutional:      General: He is not in acute distress.    Appearance: Normal appearance. He is not ill-appearing, toxic-appearing or diaphoretic.  HENT:     Head: Normocephalic and atraumatic.     Right Ear: External ear normal.     Left Ear: External ear normal.     Nose: Nose normal.     Mouth/Throat:     Mouth: Mucous membranes are moist.     Pharynx: Oropharynx is clear.  Eyes:     General: No scleral icterus.       Right eye: No discharge.        Left eye: No discharge.     Extraocular Movements: Extraocular movements intact.     Conjunctiva/sclera: Conjunctivae normal.     Pupils: Pupils are equal, round, and reactive to light.  Cardiovascular:     Rate and Rhythm: Normal rate and regular rhythm.     Pulses: Normal pulses.     Heart sounds: Normal heart sounds. No murmur heard.    No friction rub. No gallop.  Pulmonary:     Effort: Pulmonary effort is normal. No respiratory distress.     Breath sounds: Normal breath sounds. No stridor. No wheezing, rhonchi or rales.  Chest:     Chest wall: No tenderness.  Musculoskeletal:        General: Normal range of motion.     Cervical back: Normal range of motion and neck supple.  Skin:    General: Skin is warm and dry.  Capillary Refill: Capillary refill takes less than 2 seconds.     Coloration: Skin is not jaundiced or pale.     Findings: No bruising, erythema, lesion or rash.  Neurological:     General: No focal deficit present.     Mental Status: He is alert and oriented to person, place, and time. Mental status is at baseline.  Psychiatric:        Mood and Affect: Mood normal.        Behavior: Behavior normal.        Thought Content: Thought content normal.        Judgment: Judgment normal.     Results for orders placed or performed in visit on 05/31/22  Comprehensive metabolic panel  Result Value Ref Range   Glucose 241 (H) 70 - 99 mg/dL   BUN 14 8 - 27 mg/dL   Creatinine, Ser 1.13 0.76 - 1.27  mg/dL   eGFR 73 >59 mL/min/1.73   BUN/Creatinine Ratio 12 10 - 24   Sodium 138 134 - 144 mmol/L   Potassium 3.8 3.5 - 5.2 mmol/L   Chloride 98 96 - 106 mmol/L   CO2 27 20 - 29 mmol/L   Calcium 9.8 8.6 - 10.2 mg/dL   Total Protein 6.6 6.0 - 8.5 g/dL   Albumin 4.6 3.8 - 4.8 g/dL   Globulin, Total 2.0 1.5 - 4.5 g/dL   Albumin/Globulin Ratio 2.3 (H) 1.2 - 2.2   Bilirubin Total 0.4 0.0 - 1.2 mg/dL   Alkaline Phosphatase 65 44 - 121 IU/L   AST 12 0 - 40 IU/L   ALT 11 0 - 44 IU/L  Bayer DCA Hb A1c Waived  Result Value Ref Range   HB A1C (BAYER DCA - WAIVED) 9.5 (H) 4.8 - 5.6 %  Lipid Panel w/o Chol/HDL Ratio  Result Value Ref Range   Cholesterol, Total 240 (H) 100 - 199 mg/dL   Triglycerides 392 (H) 0 - 149 mg/dL   HDL 42 >39 mg/dL   VLDL Cholesterol Cal 70 (H) 5 - 40 mg/dL   LDL Chol Calc (NIH) 128 (H) 0 - 99 mg/dL  CBC with Differential/Platelet  Result Value Ref Range   WBC 5.9 3.4 - 10.8 x10E3/uL   RBC 5.35 4.14 - 5.80 x10E6/uL   Hemoglobin 15.8 13.0 - 17.7 g/dL   Hematocrit 47.2 37.5 - 51.0 %   MCV 88 79 - 97 fL   MCH 29.5 26.6 - 33.0 pg   MCHC 33.5 31.5 - 35.7 g/dL   RDW 13.3 11.6 - 15.4 %   Platelets 143 (L) 150 - 450 x10E3/uL   Neutrophils 61 Not Estab. %   Lymphs 23 Not Estab. %   Monocytes 11 Not Estab. %   Eos 4 Not Estab. %   Basos 1 Not Estab. %   Neutrophils Absolute 3.6 1.4 - 7.0 x10E3/uL   Lymphocytes Absolute 1.4 0.7 - 3.1 x10E3/uL   Monocytes Absolute 0.6 0.1 - 0.9 x10E3/uL   EOS (ABSOLUTE) 0.3 0.0 - 0.4 x10E3/uL   Basophils Absolute 0.1 0.0 - 0.2 x10E3/uL   Immature Granulocytes 0 Not Estab. %   Immature Grans (Abs) 0.0 0.0 - 0.1 x10E3/uL  Testosterone, free, total([facility])  Result Value Ref Range   Testosterone 461 264 - 916 ng/dL   Testosterone, Free 6.2 (L) 6.6 - 18.1 pg/mL   Sex Hormone Binding 32.1 19.3 - 76.4 nmol/L  TSH  Result Value Ref Range   TSH 11.300 (H) 0.450 - 4.500 uIU/mL  Assessment & Plan:   Problem List Items  Addressed This Visit       Endocrine   Diabetes mellitus due to underlying condition with diabetic neuropathy ([place]) - Primary    Doing great with A1c of 6.6. Continue current regimen. Continue to monitor. Call with any concerns.       Relevant Orders   Bayer DCA Hb A1c Waived   Hypothyroidism    Rechecking labs today. Await results. Treat as needed.         Follow up plan: Return in about 3 months (around 12/05/2022).

## 2022-09-05 NOTE — Patient Instructions (Signed)

## 2022-09-06 LAB — TSH: TSH: 0.14 u[IU]/mL — ABNORMAL LOW (ref 0.450–4.500)

## 2022-09-12 ENCOUNTER — Other Ambulatory Visit: Payer: Self-pay | Admitting: Family Medicine

## 2022-09-12 DIAGNOSIS — E039 Hypothyroidism, unspecified: Secondary | ICD-10-CM

## 2022-09-12 MED ORDER — LEVOTHYROXINE SODIUM 200 MCG PO TABS
200.0000 ug | ORAL_TABLET | Freq: Every day | ORAL | 1 refills | Status: DC
Start: 2022-09-12 — End: 2022-10-25

## 2022-09-12 MED ORDER — LEVOTHYROXINE SODIUM 75 MCG PO TABS: 75.0000 ug | ORAL_TABLET | Freq: Every day | ORAL | 1 refills | Status: DC

## 2022-10-21 ENCOUNTER — Other Ambulatory Visit: Payer: Self-pay | Admitting: Family Medicine

## 2022-10-21 NOTE — Telephone Encounter (Signed)
Requested medication (s) are due for refill today: yes  Requested medication (s) are on the active medication list: yes    Last refill: 03/18/22  1 ml 5 refills  Future visit scheduled yes 12/05/22  Notes to clinic:off protocol, please review. Thank you.  Requested Prescriptions  Pending Prescriptions Disp Refills   testosterone cypionate (DEPOTESTOSTERONE CYPIONATE) 200 MG/ML injection [Pharmacy Med Name: TESTOSTERONE CYP '200MG'$ /ML SDV 1ML] 1 mL     Sig: ADMINISTER 0.5 ML(100 MG) IN THE MUSCLE EVERY 14 DAYS     Off-Protocol Failed - 10/21/2022  1:32 PM      Failed - Medication not assigned to a protocol, review manually.      Passed - Valid encounter within last 12 months    Recent Outpatient Visits           1 month ago Diabetes mellitus due to underlying condition with diabetic neuropathy, without long-term current use of insulin ([place])   [place], Megan P, DO   3 months ago Diabetes mellitus due to underlying condition with diabetic neuropathy, without long-term current use of insulin ([place])   [place], Megan P, DO   4 months ago Diabetes mellitus due to underlying condition with diabetic neuropathy, without long-term current use of insulin ([place])   [place], Megan P, DO   6 months ago Acute non-recurrent maxillary sinusitis   [facility] [place], Megan P, DO   8 months ago Diabetes mellitus due to underlying condition with diabetic neuropathy, without long-term current use of insulin ([place])   [place], [place], DO       Future Appointments             In 1 month Johnson, Barb Merino, DO [employer]

## 2022-10-24 ENCOUNTER — Other Ambulatory Visit: Payer: Self-pay | Admitting: Family Medicine

## 2022-10-24 ENCOUNTER — Other Ambulatory Visit: Payer: Managed Care, Other (non HMO)

## 2022-10-24 DIAGNOSIS — E039 Hypothyroidism, unspecified: Secondary | ICD-10-CM

## 2022-10-25 ENCOUNTER — Other Ambulatory Visit: Payer: Self-pay | Admitting: Family Medicine

## 2022-10-25 LAB — TSH: TSH: 1.35 u[IU]/mL (ref 0.450–4.500)

## 2022-10-25 MED ORDER — LEVOTHYROXINE SODIUM 75 MCG PO TABS: 75.0000 ug | ORAL_TABLET | Freq: Every day | ORAL | 1 refills | Status: DC

## 2022-10-25 MED ORDER — LEVOTHYROXINE SODIUM 200 MCG PO TABS: 200.0000 ug | ORAL_TABLET | Freq: Every day | ORAL | 1 refills | Status: DC

## 2022-10-27 ENCOUNTER — Other Ambulatory Visit: Payer: Self-pay

## 2022-10-27 NOTE — Telephone Encounter (Signed)
Per pharmacy send in new RX with quantity for  2ML for 28 day supply. Due to 1ML vial is single use and the remainder is discarded.

## 2022-10-28 MED ORDER — TESTOSTERONE CYPIONATE 200 MG/ML IM SOLN: INTRAMUSCULAR | 0 refills | Status: DC

## 2022-12-05 ENCOUNTER — Encounter: Payer: Self-pay | Admitting: Family Medicine

## 2022-12-05 ENCOUNTER — Ambulatory Visit: Payer: Managed Care, Other (non HMO) | Admitting: Family Medicine

## 2022-12-05 VITALS — BP 164/73 | HR 57 | Temp 97.8°F | Ht 72.0 in | Wt 230.1 lb

## 2022-12-05 DIAGNOSIS — Z23 Encounter for immunization: Secondary | ICD-10-CM

## 2022-12-05 DIAGNOSIS — Z0000 Encounter for general adult medical examination without abnormal findings: Secondary | ICD-10-CM

## 2022-12-05 DIAGNOSIS — E0840 Diabetes mellitus due to underlying condition with diabetic neuropathy, unspecified: Secondary | ICD-10-CM | POA: Diagnosis not present

## 2022-12-05 DIAGNOSIS — R21 Rash and other nonspecific skin eruption: Secondary | ICD-10-CM | POA: Diagnosis not present

## 2022-12-05 DIAGNOSIS — E291 Testicular hypofunction: Secondary | ICD-10-CM

## 2022-12-05 DIAGNOSIS — N182 Chronic kidney disease, stage 2 (mild): Secondary | ICD-10-CM

## 2022-12-05 DIAGNOSIS — I129 Hypertensive chronic kidney disease with stage 1 through stage 4 chronic kidney disease, or unspecified chronic kidney disease: Secondary | ICD-10-CM

## 2022-12-05 DIAGNOSIS — E782 Mixed hyperlipidemia: Secondary | ICD-10-CM

## 2022-12-05 DIAGNOSIS — I152 Hypertension secondary to endocrine disorders: Secondary | ICD-10-CM

## 2022-12-05 DIAGNOSIS — E1159 Type 2 diabetes mellitus with other circulatory complications: Secondary | ICD-10-CM

## 2022-12-05 DIAGNOSIS — E039 Hypothyroidism, unspecified: Secondary | ICD-10-CM

## 2022-12-05 LAB — MICROSCOPIC EXAMINATION: Bacteria, UA: NONE SEEN

## 2022-12-05 LAB — URINALYSIS, ROUTINE W REFLEX MICROSCOPIC
Bilirubin, UA: NEGATIVE
Ketones, UA: NEGATIVE
Leukocytes,UA: NEGATIVE
Nitrite, UA: NEGATIVE
RBC, UA: NEGATIVE
Specific Gravity, UA: 1.02 (ref 1.005–1.030)
Urobilinogen, Ur: 0.2 mg/dL (ref 0.2–1.0)
pH, UA: 7 (ref 5.0–7.5)

## 2022-12-05 LAB — BAYER DCA HB A1C WAIVED: HB A1C (BAYER DCA - WAIVED): 8.6 % — ABNORMAL HIGH (ref 4.8–5.6)

## 2022-12-05 LAB — MICROALBUMIN, URINE WAIVED
Creatinine, Urine Waived: 50 mg/dL (ref 10–300)
Microalb, Ur Waived: 150 mg/L — ABNORMAL HIGH (ref 0–19)
Microalb/Creat Ratio: >300 mg/g — ABNORMAL HIGH (ref <30)

## 2022-12-05 MED ORDER — LOSARTAN POTASSIUM 50 MG PO TABS: 50.0000 mg | ORAL_TABLET | Freq: Every day | ORAL | 1 refills | Status: DC

## 2022-12-05 MED ORDER — PIOGLITAZONE HCL 30 MG PO TABS: 30.0000 mg | ORAL_TABLET | Freq: Every day | ORAL | 1 refills | Status: DC

## 2022-12-05 MED ORDER — FENOFIBRATE 160 MG PO TABS: 160.0000 mg | ORAL_TABLET | Freq: Every day | ORAL | 1 refills | Status: DC

## 2022-12-05 MED ORDER — ROSUVASTATIN CALCIUM 10 MG PO TABS: ORAL_TABLET | ORAL | 1 refills | Status: DC

## 2022-12-05 MED ORDER — EMPAGLIFLOZIN 25 MG PO TABS: 25.0000 mg | ORAL_TABLET | Freq: Every day | ORAL | 1 refills | Status: DC

## 2022-12-05 MED ORDER — GLIPIZIDE 5 MG PO TABS: 5.0000 mg | ORAL_TABLET | Freq: Two times a day (BID) | ORAL | 1 refills | Status: DC

## 2022-12-05 MED ORDER — METFORMIN HCL ER 500 MG PO TB24: ORAL_TABLET | ORAL | 1 refills | Status: DC

## 2022-12-05 MED ORDER — AMLODIPINE BESYLATE 10 MG PO TABS: 10.0000 mg | ORAL_TABLET | Freq: Every day | ORAL | 1 refills | Status: DC

## 2022-12-05 MED ORDER — HYDROCHLOROTHIAZIDE 25 MG PO TABS: 25.0000 mg | ORAL_TABLET | Freq: Every day | ORAL | 1 refills | Status: DC

## 2022-12-05 NOTE — Assessment & Plan Note (Signed)
Rechecking labs today. Await results. Treat as needed.  °

## 2022-12-05 NOTE — Assessment & Plan Note (Signed)
Under good control on current regimen. Continue current regimen. Continue to monitor. Call with any concerns. Refills given. Labs drawn today.   

## 2022-12-05 NOTE — Assessment & Plan Note (Signed)
Running high. Has missed medication recently. Really work on taking his medicine and recheck in 3 months. Call with any concerns. Refills given today. Labs drawn today.

## 2022-12-05 NOTE — Assessment & Plan Note (Signed)
Up from 6.8 to 8.6- will really work on taking his medicine and recheck 3 months. Call with any concerns. Continue to monitor. Call with any concerns. Refills given.

## 2022-12-05 NOTE — Progress Notes (Signed)
BP (!) 164/73 (BP Location: Left Arm, Cuff Size: Normal)   Pulse (!) 57   Temp 97.8 F (36.6 C) (Oral)   Ht 6' (1.829 m)   Wt 230 lb 1.6 oz (104.4 kg)   SpO2 95%   BMI 31.21 kg/m    Subjective:    Patient ID: Manuel Burnett, male    DOB: 1956/09/11, 66 y.o.   MRN: [id number]  HPI: Manuel Burnett is a 66 y.o. male presenting on 12/05/2022 for comprehensive medical examination. Current medical complaints include:  DIABETES Hypoglycemic episodes:no Polydipsia/polyuria: no Visual disturbance: no Chest pain: no Paresthesias: no Glucose Monitoring: yes  Accucheck frequency: Daily Taking Insulin?: no Blood Pressure Monitoring: not checking Retinal Examination: Up to Date Foot Exam: Up to Date Diabetic Education: Completed Pneumovax: Up to Date Influenza: Up to Date Aspirin: no  HYPERTENSION / [place] Satisfied with current treatment? yes Duration of hypertension: chronic BP monitoring frequency: not checking BP medication side effects: no Duration of hyperlipidemia: chronic Cholesterol medication side effects: no Cholesterol supplements: none Past cholesterol medications: crestor, fenofibrate Medication compliance: excellent compliance Aspirin: no Recent stressors: no Recurrent headaches: no Visual changes: no Palpitations: no Dyspnea: no Chest pain: no Lower extremity edema: no Dizzy/lightheaded: no  HYPOTHYROIDISM Thyroid control status:controlled Satisfied with current treatment? yes Medication side effects: no Medication compliance: excellent compliance Recent dose adjustment:no Fatigue: no Cold intolerance: no Heat intolerance: no Weight gain: no Weight loss: no Constipation: no Diarrhea/loose stools: yes Palpitations: no Lower extremity edema: no Anxiety/depressed mood: no  LOW TESTOSTERONE Duration: chronic Status: stable  Satisfied with current treatment:  yes Previous testosterone therapies: Medication side effects:   no Medication compliance: excellent compliance Decreased libido: no Fatigue: no Depressed mood: no Muscle weakness: no Erectile dysfunction: yes   Interim Problems from his last visit: no  Depression Screen done today and results listed below:     12/05/2022    8:43 AM 09/05/2022    8:28 AM 07/05/2022    9:39 AM 05/31/2022    8:46 AM 04/12/2022    8:45 AM  Depression screen PHQ 2/9  Decreased Interest 0 0 0 0 0  Down, Depressed, Hopeless 0 0 0 0 0  PHQ - 2 Score 0 0 0 0 0  Altered sleeping 0 0 0 0 0  Tired, decreased energy 0 0 0 0 0  Change in appetite 0 0 0 0 0  Feeling bad or failure about yourself  0 0 0 0 0  Trouble concentrating 0 0 0 0 0  Moving slowly or fidgety/restless 0 0 0 0 0  Suicidal thoughts 0 0 0 0 0  PHQ-9 Score 0 0 0 0 0  Difficult doing work/chores Not difficult at all Not difficult at all Not difficult at all Not difficult at all      Past Medical History:  Past Medical History:  Diagnosis Date   Cancer ([place])    SKIN   Chronic kidney disease    Diabetes mellitus without complication ([place])    Dysplastic nevus 01/08/2021   right upper back biopsy proven compound dysplastic nevus with mild atypia margins involved    Erectile dysfunction    GERD (gastroesophageal reflux disease)    Hyperlipidemia    Hypertension    Hypogonadism male    Hypothyroidism    Proteinuria    Sleep apnea    CPAP   Thyroid disease     Surgical History:  Past Surgical History:  Procedure Laterality Date   CATARACT  EXTRACTION W/PHACO Right 12/26/2017   Procedure: CATARACT EXTRACTION PHACO AND INTRAOCULAR LENS PLACEMENT (IOC);  Surgeon: Birder Robson, MD;  Location: [facility] ORS;  Service: Ophthalmology;  Laterality: Right;  [place] 00:21.8 AP% 21.1 CDE 2.64 FLUID PACK LOT # [id number]   CATARACT EXTRACTION W/PHACO Left 01/09/2018   Procedure: CATARACT EXTRACTION PHACO AND INTRAOCULAR LENS PLACEMENT (IOC);  Surgeon: Birder Robson, MD;  Location: [facility] ORS;  Service:  Ophthalmology;  Laterality: Left;  [place] 00:28.9 AP% 10.0 CDE 2.90 Fluid pcak lot # [id number]   CLAVICLE SURGERY     internal fixation   COLONOSCOPY WITH PROPOFOL N/A 04/06/2020   Procedure: COLONOSCOPY WITH PROPOFOL;  Surgeon: Lin Landsman, MD;  Location: [facility];  Service: Gastroenterology;  Laterality: N/A;  priority 4   EYE SURGERY Bilateral 12/2017   Cataract   FRACTURE SURGERY     HEMORRHOID SURGERY     TONSILLECTOMY     TONSILLECTOMY AND ADENOIDECTOMY      Medications:  Current Outpatient Medications on File Prior to Visit  Medication Sig   albuterol (PROAIR HFA) 108 (90 Base) MCG/ACT inhaler Inhale 2 puffs into the lungs every 4 (four) hours as needed for wheezing or shortness of breath.   B-D INTEGRA SYRINGE 22G X 1-1/2" 3 ML MISC Inject testosterone 1x every 2 weeks   BD HYPODERMIC NEEDLE 18G X 1" MISC INJECT TESTOSTERONE EVERY 2 WEEKS AS DIRECTED   glucose blood (ACCU-CHEK GUIDE) test strip Use as instructed   levothyroxine (SYNTHROID) 200 MCG tablet Take 1 tablet (200 mcg total) by mouth daily before breakfast. To be taken with '75mg'$  for 23mg total.   levothyroxine (SYNTHROID) 75 MCG tablet Take 1 tablet (75 mcg total) by mouth daily. To be taken with '200mg'$  for 2719m total.   Multiple Vitamin (MULTIVITAMIN WITH MINERALS) TABS tablet Take 1 tablet by mouth daily.   sildenafil (VIAGRA) 100 MG tablet Take 1 tablet (100 mg total) by mouth daily as needed for erectile dysfunction.   testosterone cypionate (DEPOTESTOSTERONE CYPIONATE) 200 MG/ML injection ADMINISTER 0.5 ML(100 MG) IN THE MUSCLE EVERY 14 DAYS   No current facility-administered medications on file prior to visit.    Allergies:  Allergies  Allergen Reactions   Codeine Sulfate Rash   Ozempic (0.25 Or 0.5 Mg-Dose) [Semaglutide(0.25 Or 0.'5mg'$ -Dos)] Rash   Semaglutide Rash   Trulicity [Dulaglutide] Rash    Social History:  Social History   Socioeconomic History   Marital status: Married    Spouse  name: Not on file   Number of children: Not on file   Years of education: Not on file   Highest education level: Not on file  Occupational History   Not on file  Tobacco Use   Smoking status: Every Day    Packs/day: 0.50    Types: Cigarettes   Smokeless tobacco: Never  Vaping Use   Vaping Use: Never used  Substance and Sexual Activity   Alcohol use: Not Currently    Alcohol/week: 0.0 standard drinks of alcohol   Drug use: No   Sexual activity: Not Currently  Other Topics Concern   Not on file  Social History Narrative   Not on file   Social Determinants of Health   Financial Resource Strain: Not on file  Food Insecurity: Not on file  Transportation Needs: Not on file  Physical Activity: Not on file  Stress: Not on file  Social Connections: Not on file  Intimate Partner Violence: Not on file   Social History   Tobacco Use  Smoking Status Every Day   Packs/day: 0.50   Types: Cigarettes  Smokeless Tobacco Never   Social History   Substance and Sexual Activity  Alcohol Use Not Currently   Alcohol/week: 0.0 standard drinks of alcohol    Family History:  Family History  Problem Relation Age of Onset   Hypertension Mother    Heart disease Father    Heart attack Father    CAD Maternal Grandmother    Cancer Maternal Grandfather     Past medical history, surgical history, medications, allergies, family history and social history reviewed with patient today and changes made to appropriate areas of the chart.   Review of Systems  Constitutional: Negative.   HENT: Negative.    Eyes: Negative.   Respiratory: Negative.    Cardiovascular: Negative.   Gastrointestinal: Negative.   Genitourinary: Negative.   Musculoskeletal: Negative.   Skin:  Positive for rash. Negative for itching.  Neurological: Negative.   Endo/Heme/Allergies:  Positive for environmental allergies. Negative for polydipsia. Does not bruise/bleed easily.  Psychiatric/Behavioral: Negative.      All other ROS negative except what is listed above and in the HPI.      Objective:    BP (!) 164/73 (BP Location: Left Arm, Cuff Size: Normal)   Pulse (!) 57   Temp 97.8 F (36.6 C) (Oral)   Ht 6' (1.829 m)   Wt 230 lb 1.6 oz (104.4 kg)   SpO2 95%   BMI 31.21 kg/m   Wt Readings from Last 3 Encounters:  12/05/22 230 lb 1.6 oz (104.4 kg)  09/05/22 225 lb (102.1 kg)  09/05/22 225 lb 3.2 oz (102.2 kg)    Physical Exam Vitals and nursing note reviewed.  Constitutional:      General: He is not in acute distress.    Appearance: Normal appearance. He is not ill-appearing, toxic-appearing or diaphoretic.  HENT:     Head: Normocephalic and atraumatic.     Right Ear: Tympanic membrane, ear canal and external ear normal. There is no impacted cerumen.     Left Ear: Tympanic membrane, ear canal and external ear normal. There is no impacted cerumen.     Nose: Nose normal. No congestion or rhinorrhea.     Mouth/Throat:     Mouth: Mucous membranes are moist.     Pharynx: Oropharynx is clear. No oropharyngeal exudate or posterior oropharyngeal erythema.  Eyes:     General: No scleral icterus.       Right eye: No discharge.        Left eye: No discharge.     Extraocular Movements: Extraocular movements intact.     Conjunctiva/sclera: Conjunctivae normal.     Pupils: Pupils are equal, round, and reactive to light.  Neck:     Vascular: No carotid bruit.  Cardiovascular:     Rate and Rhythm: Normal rate and regular rhythm.     Pulses: Normal pulses.     Heart sounds: No murmur heard.    No friction rub. No gallop.  Pulmonary:     Effort: Pulmonary effort is normal. No respiratory distress.     Breath sounds: Normal breath sounds. No stridor. No wheezing, rhonchi or rales.  Chest:     Chest wall: No tenderness.  Abdominal:     General: Abdomen is flat. Bowel sounds are normal. There is no distension.     Palpations: Abdomen is soft. There is no mass.     Tenderness: There is no  abdominal tenderness. There is no  right CVA tenderness, left CVA tenderness, guarding or rebound.     Hernia: No hernia is present.  Genitourinary:    Comments: Genital exam deferred with shared decision making Musculoskeletal:        General: No swelling, tenderness, deformity or signs of injury.     Cervical back: Normal range of motion and neck supple. No rigidity. No muscular tenderness.     Right lower leg: No edema.     Left lower leg: No edema.  Lymphadenopathy:     Cervical: No cervical adenopathy.  Skin:    General: Skin is warm and dry.     Capillary Refill: Capillary refill takes less than 2 seconds.     Coloration: Skin is not jaundiced or pale.     Findings: No bruising, erythema, lesion or rash.  Neurological:     General: No focal deficit present.     Mental Status: He is alert and oriented to person, place, and time.     Cranial Nerves: No cranial nerve deficit.     Sensory: No sensory deficit.     Motor: No weakness.     Coordination: Coordination normal.     Gait: Gait normal.     Deep Tendon Reflexes: Reflexes normal.  Psychiatric:        Mood and Affect: Mood normal.        Behavior: Behavior normal.        Thought Content: Thought content normal.        Judgment: Judgment normal.     Results for orders placed or performed in visit on 10/24/22  TSH  Result Value Ref Range   TSH 1.350 0.450 - 4.500 uIU/mL      Assessment & Plan:   Problem List Items Addressed This Visit       Cardiovascular and Mediastinum   Hypertension associated with diabetes ([place])    Running high. Has missed medication recently. Really work on taking his medicine and recheck in 3 months. Call with any concerns. Refills given today. Labs drawn today.      Relevant Medications   amLODipine (NORVASC) 10 MG tablet   empagliflozin (JARDIANCE) 25 MG TABS tablet   fenofibrate 160 MG tablet   glipiZIDE (GLUCOTROL) 5 MG tablet   hydrochlorothiazide (HYDRODIURIL) 25 MG tablet    losartan (COZAAR) 50 MG tablet   metFORMIN (GLUCOPHAGE-XR) 500 MG 24 hr tablet   pioglitazone (ACTOS) 30 MG tablet   rosuvastatin (CRESTOR) 10 MG tablet     Endocrine   Diabetes mellitus due to underlying condition with diabetic neuropathy ([facility])    Up from 6.8 to 8.6- will really work on taking his medicine and recheck 3 months. Call with any concerns. Continue to monitor. Call with any concerns. Refills given.       Relevant Medications   empagliflozin (JARDIANCE) 25 MG TABS tablet   glipiZIDE (GLUCOTROL) 5 MG tablet   losartan (COZAAR) 50 MG tablet   metFORMIN (GLUCOPHAGE-XR) 500 MG 24 hr tablet   pioglitazone (ACTOS) 30 MG tablet   rosuvastatin (CRESTOR) 10 MG tablet   Other Relevant Orders   Microalbumin, Urine Waived   Bayer DCA Hb A1c Waived   Male hypogonadism    Rechecking labs today. Await results. Treat as needed.       Relevant Orders   Testosterone, free, total([facility])   Hypothyroidism    Rechecking labs today. Await results. Treat as needed.         Genitourinary   Benign hypertensive renal  disease    Running high. Has missed medication recently. Really work on taking his medicine and recheck in 3 months. Call with any concerns. Refills given today. Labs drawn today.      Chronic kidney disease, stage II (mild)    Rechecking labs today. Await results. Treat as needed.         Other   Hyperlipemia    Under good control on current regimen. Continue current regimen. Continue to monitor. Call with any concerns. Refills given. Labs drawn today.       Relevant Medications   amLODipine (NORVASC) 10 MG tablet   fenofibrate 160 MG tablet   hydrochlorothiazide (HYDRODIURIL) 25 MG tablet   losartan (COZAAR) 50 MG tablet   rosuvastatin (CRESTOR) 10 MG tablet   Other Visit Diagnoses     Routine general medical examination at a health care facility    -  Primary   Vaccines up to date. Screening labs checked today. Colonoscopy up to date. Continue diet  and exercise. Continue to monitor. Call with any concerns.   Relevant Orders   Comprehensive metabolic panel   CBC with Differential/Platelet   Lipid Panel w/o Chol/HDL Ratio   PSA   TSH   Urinalysis, Routine w reflex microscopic   HIV Antibody (routine testing w rflx)   Hepatitis C Antibody   Rash       Referral to dermatology made today.   Relevant Orders   Ambulatory referral to Dermatology   Needs flu shot       Flu shot given today.   Relevant Orders   Flu Vaccine QUAD High Dose(Fluad) (Completed)        LABORATORY TESTING:  Health maintenance labs ordered today as discussed above.   The natural history of prostate cancer and ongoing controversy regarding screening and potential treatment outcomes of prostate cancer has been discussed with the patient. The meaning of a false positive PSA and a false negative PSA has been discussed. He indicates understanding of the limitations of this screening test and wishes to proceed with screening PSA testing.   IMMUNIZATIONS:   - Tdap: Tetanus vaccination status reviewed: last tetanus booster within 10 years. - Influenza: Administered today - Pneumovax: Up to date - Prevnar: Not applicable - COVID: Up to date - HPV: Not applicable - Shingrix vaccine: Up to date  SCREENING: - Colonoscopy: Up to date  Discussed with patient purpose of the colonoscopy is to detect colon cancer at curable precancerous or early stages   PATIENT COUNSELING:    Sexuality: Discussed sexually transmitted diseases, partner selection, use of condoms, avoidance of unintended pregnancy  and contraceptive alternatives.   Advised to avoid cigarette smoking.  I discussed with the patient that most people either abstain from alcohol or drink within safe limits (<=14/week and <=4 drinks/occasion for males, <=7/weeks and <= 3 drinks/occasion for females) and that the risk for alcohol disorders and other health effects rises proportionally with the number of  drinks per week and how often a drinker exceeds daily limits.  Discussed cessation/primary prevention of drug use and availability of treatment for abuse.   Diet: Encouraged to adjust caloric intake to maintain  or achieve ideal body weight, to reduce intake of dietary saturated fat and total fat, to limit sodium intake by avoiding high sodium foods and not adding table salt, and to maintain adequate dietary potassium and calcium preferably from fresh fruits, vegetables, and low-fat dairy products.    stressed the importance of regular exercise  Injury  prevention: Discussed safety belts, safety helmets, smoke detector, smoking near bedding or upholstery.   Dental health: Discussed importance of regular tooth brushing, flossing, and dental visits.   Follow up plan: NEXT PREVENTATIVE PHYSICAL DUE IN 1 YEAR. Return in about 3 months (around 03/06/2023).

## 2022-12-09 ENCOUNTER — Other Ambulatory Visit: Payer: Self-pay | Admitting: Family Medicine

## 2022-12-09 LAB — CBC WITH DIFFERENTIAL/PLATELET
Basophils Absolute: 0.1 10*3/uL (ref 0.0–0.2)
Basos: 1 %
EOS (ABSOLUTE): 0.3 10*3/uL (ref 0.0–0.4)
Eos: 4 %
Hematocrit: 45.3 % (ref 37.5–51.0)
Hemoglobin: 15.3 g/dL (ref 13.0–17.7)
Immature Grans (Abs): 0 10*3/uL (ref 0.0–0.1)
Immature Granulocytes: 0 %
Lymphocytes Absolute: 1.6 10*3/uL (ref 0.7–3.1)
Lymphs: 24 %
MCH: 29.5 pg (ref 26.6–33.0)
MCHC: 33.8 g/dL (ref 31.5–35.7)
MCV: 87 fL (ref 79–97)
Monocytes Absolute: 0.6 10*3/uL (ref 0.1–0.9)
Monocytes: 9 %
Neutrophils Absolute: 4.2 10*3/uL (ref 1.4–7.0)
Neutrophils: 62 %
Platelets: 174 10*3/uL (ref 150–450)
RBC: 5.19 x10E6/uL (ref 4.14–5.80)
RDW: 13.3 % (ref 11.6–15.4)
WBC: 6.6 10*3/uL (ref 3.4–10.8)

## 2022-12-09 LAB — HEPATITIS C ANTIBODY: Hep C Virus Ab: NONREACTIVE

## 2022-12-09 LAB — COMPREHENSIVE METABOLIC PANEL
ALT: 13 IU/L (ref 0–44)
AST: 17 IU/L (ref 0–40)
Albumin/Globulin Ratio: 1.9 (ref 1.2–2.2)
Albumin: 4.3 g/dL (ref 3.9–4.9)
Alkaline Phosphatase: 70 IU/L (ref 44–121)
BUN/Creatinine Ratio: 13 (ref 10–24)
BUN: 16 mg/dL (ref 8–27)
Bilirubin Total: 0.4 mg/dL (ref 0.0–1.2)
CO2: 24 mmol/L (ref 20–29)
Calcium: 9.4 mg/dL (ref 8.6–10.2)
Chloride: 101 mmol/L (ref 96–106)
Creatinine, Ser: 1.19 mg/dL (ref 0.76–1.27)
Globulin, Total: 2.3 g/dL (ref 1.5–4.5)
Glucose: 143 mg/dL — ABNORMAL HIGH (ref 70–99)
Potassium: 3.6 mmol/L (ref 3.5–5.2)
Sodium: 140 mmol/L (ref 134–144)
Total Protein: 6.6 g/dL (ref 6.0–8.5)
eGFR: 68 mL/min/{1.73_m2} (ref >59)

## 2022-12-09 LAB — HIV ANTIBODY (ROUTINE TESTING W REFLEX): HIV Screen 4th Generation wRfx: NONREACTIVE

## 2022-12-09 LAB — LIPID PANEL W/O CHOL/HDL RATIO
Cholesterol, Total: 296 mg/dL — ABNORMAL HIGH (ref 100–199)
HDL: 41 mg/dL (ref >39)
LDL Chol Calc (NIH): 146 mg/dL — ABNORMAL HIGH (ref 0–99)
Triglycerides: 572 mg/dL (ref 0–149)
VLDL Cholesterol Cal: 109 mg/dL — ABNORMAL HIGH (ref 5–40)

## 2022-12-09 LAB — PSA: Prostate Specific Ag, Serum: 2 ng/mL (ref 0.0–4.0)

## 2022-12-09 LAB — TESTOSTERONE, FREE, TOTAL, SHBG
Sex Hormone Binding: 33.2 nmol/L (ref 19.3–76.4)
Testosterone, Free: 4.8 pg/mL — ABNORMAL LOW (ref 6.6–18.1)
Testosterone: 265 ng/dL (ref 264–916)

## 2022-12-09 LAB — TSH: TSH: 2.66 u[IU]/mL (ref 0.450–4.500)

## 2022-12-09 MED ORDER — LEVOTHYROXINE SODIUM 75 MCG PO TABS: 75.0000 ug | ORAL_TABLET | Freq: Every day | ORAL | 1 refills | Status: DC

## 2022-12-09 MED ORDER — TESTOSTERONE CYPIONATE 200 MG/ML IM SOLN: INTRAMUSCULAR | 5 refills | Status: DC

## 2022-12-09 MED ORDER — LEVOTHYROXINE SODIUM 200 MCG PO TABS: 200.0000 ug | ORAL_TABLET | Freq: Every day | ORAL | 1 refills | Status: DC

## 2022-12-22 LAB — HM DIABETES EYE EXAM

## 2022-12-29 ENCOUNTER — Encounter (HOSPITAL_BASED_OUTPATIENT_CLINIC_OR_DEPARTMENT_OTHER): Payer: Self-pay | Admitting: Emergency Medicine

## 2022-12-29 ENCOUNTER — Emergency Department (HOSPITAL_BASED_OUTPATIENT_CLINIC_OR_DEPARTMENT_OTHER): Payer: No Typology Code available for payment source | Admitting: Radiology

## 2022-12-29 ENCOUNTER — Other Ambulatory Visit: Payer: Self-pay

## 2022-12-29 ENCOUNTER — Encounter: Payer: Self-pay | Admitting: Family Medicine

## 2022-12-29 ENCOUNTER — Emergency Department (HOSPITAL_BASED_OUTPATIENT_CLINIC_OR_DEPARTMENT_OTHER)
Admission: EM | Admit: 2022-12-29 | Discharge: 2022-12-29 | Disposition: A | Discharge status: Home / Self Care | Payer: No Typology Code available for payment source | Attending: Emergency Medicine | Admitting: Emergency Medicine

## 2022-12-29 DIAGNOSIS — S8992XA Unspecified injury of left lower leg, initial encounter: Secondary | ICD-10-CM

## 2022-12-29 DIAGNOSIS — S80212A Abrasion, left knee, initial encounter: Secondary | ICD-10-CM | POA: Insufficient documentation

## 2022-12-29 DIAGNOSIS — Y92481 Parking lot as the place of occurrence of the external cause: Secondary | ICD-10-CM | POA: Diagnosis not present

## 2022-12-29 DIAGNOSIS — W19XXXA Unspecified fall, initial encounter: Secondary | ICD-10-CM | POA: Diagnosis not present

## 2022-12-29 MED ORDER — ACETAMINOPHEN 325 MG PO TABS
650.0000 mg | ORAL_TABLET | Freq: Once | ORAL | Status: DC
  Filled 2022-12-29: qty 2

## 2022-12-29 NOTE — ED Triage Notes (Signed)
Pt arrives to ED with c/o fall and left knee injury that occurred today while at work.

## 2022-12-29 NOTE — ED Provider Notes (Signed)
[place] EMERGENCY DEPT Provider Note   CSN: [id number] Arrival date & time: 12/29/22  1153     History  Chief Complaint  Patient presents with   Fall   Knee Pain    Manuel Burnett is a 67 y.o. male.  Patient is a 67 year old presenting for fall that occurred today.  Patient admits to falling in the parking lot at work directly prior to arrival.  Since he fell down on his left knee and side.  Denies any head trauma, LOC, or blood thinner use.  Admits to abrasion to the left knee and pain.  Denies difficulty walking.  Sensation or motor dysfunction.  The history is provided by the patient. No language interpreter was used.  Fall  Knee Pain Associated symptoms: no back pain and no fever        Home Medications Prior to Admission medications   Medication Sig Start Date End Date Taking? Authorizing Provider  albuterol (PROAIR HFA) 108 (90 Base) MCG/ACT inhaler Inhale 2 puffs into the lungs every 4 (four) hours as needed for wheezing or shortness of breath. 05/31/22   Johnson, Megan P, DO  amLODipine (NORVASC) 10 MG tablet Take 1 tablet (10 mg total) by mouth daily. 12/05/22   Johnson, Megan P, DO  B-D INTEGRA SYRINGE 22G X 1-1/2" 3 ML MISC Inject testosterone 1x every 2 weeks 08/08/19   Johnson, Megan P, DO  BD HYPODERMIC NEEDLE 18G X 1" MISC INJECT TESTOSTERONE EVERY 2 WEEKS AS DIRECTED 12/24/20   Wynetta Emery, Megan P, DO  empagliflozin (JARDIANCE) 25 MG TABS tablet Take 1 tablet (25 mg total) by mouth daily before breakfast. 12/05/22   Wynetta Emery, Megan P, DO  fenofibrate 160 MG tablet Take 1 tablet (160 mg total) by mouth daily. with food 12/05/22   Park Liter P, DO  glipiZIDE (GLUCOTROL) 5 MG tablet Take 1 tablet (5 mg total) by mouth 2 (two) times daily before a meal. 12/05/22   Johnson, Megan P, DO  glucose blood (ACCU-CHEK GUIDE) test strip Use as instructed 06/24/22   Park Liter P, DO  hydrochlorothiazide (HYDRODIURIL) 25 MG tablet Take 1 tablet (25 mg total)  by mouth daily. 12/05/22   Park Liter P, DO  levothyroxine (SYNTHROID) 200 MCG tablet Take 1 tablet (200 mcg total) by mouth daily before breakfast. To be taken with '75mg'$  for 247mg total. 12/09/22   Johnson, Megan P, DO  levothyroxine (SYNTHROID) 75 MCG tablet Take 1 tablet (75 mcg total) by mouth daily. To be taken with '200mg'$  for 2778m total. 12/09/22   JoPark Liter, DO  losartan (COZAAR) 50 MG tablet Take 1 tablet (50 mg total) by mouth daily. 12/05/22   Johnson, Megan P, DO  metFORMIN (GLUCOPHAGE-XR) 500 MG 24 hr tablet TAKE 2 TABLETS(1000 MG) BY MOUTH IN THE MORNING AND AT BEDTIME 12/05/22   Johnson, Megan P, DO  Multiple Vitamin (MULTIVITAMIN WITH MINERALS) TABS tablet Take 1 tablet by mouth daily.    [provider]  pioglitazone (ACTOS) 30 MG tablet Take 1 tablet (30 mg total) by mouth daily. 12/05/22   Johnson, Megan P, DO  rosuvastatin (CRESTOR) 10 MG tablet TAKE 1 TABLET(10 MG) BY MOUTH DAILY 12/05/22   Johnson, Megan P, DO  sildenafil (VIAGRA) 100 MG tablet Take 1 tablet (100 mg total) by mouth daily as needed for erectile dysfunction. 11/10/21   Johnson, Megan P, DO  testosterone cypionate (DEPOTESTOSTERONE CYPIONATE) 200 MG/ML injection ADMINISTER 0.5 ML(100 MG) IN THE MUSCLE EVERY 14 DAYS 12/09/22  Johnson, Megan P, DO      Allergies    Codeine sulfate, Ozempic (0.25 or 0.5 mg-dose) [semaglutide(0.25 or 0.'5mg'$ -dos)], Semaglutide, and Trulicity [dulaglutide]    Review of Systems   Review of Systems  Constitutional:  Negative for chills and fever.  Musculoskeletal:  Negative for back pain and gait problem.  Skin:  Positive for wound. Negative for color change.    Physical Exam Updated Vital Signs BP (!) 167/93 (BP Location: Right Arm)   Pulse 67   Temp 98.2 F (36.8 C) (Oral)   Resp 18   Ht 6' (1.829 m)   Wt 98.9 kg   SpO2 98%   BMI 29.57 kg/m  Physical Exam Vitals and nursing note reviewed.  Constitutional:      Appearance: Normal appearance.   Cardiovascular:     Rate and Rhythm: Normal rate and regular rhythm.     Pulses:          Dorsalis pedis pulses are 2+ on the left side.  Pulmonary:     Effort: Pulmonary effort is normal.     Breath sounds: Normal breath sounds.  Musculoskeletal:     Left hip: Normal.     Left upper leg: Normal.     Left knee: Bony tenderness present. No swelling, deformity or effusion. Tenderness present.     Left lower leg: Normal.     Left ankle: Normal.     Comments: Left knee superficial abrasion   Skin:    Capillary Refill: Capillary refill takes less than 2 seconds.  Neurological:     Mental Status: He is alert.     Sensory: Sensation is intact.     Motor: Motor function is intact.     ED Results / Procedures / Treatments   Labs (all labs ordered are listed, but only abnormal results are displayed) Labs Reviewed - No data to display  EKG None  Radiology DG Knee Complete 4 Views Left  Result Date: 12/29/2022 CLINICAL DATA:  Left knee pain after fall. EXAM: LEFT KNEE - COMPLETE 4+ VIEW COMPARISON:  None Available. FINDINGS: No evidence of fracture, dislocation, or joint effusion. No evidence of arthropathy or other focal bone abnormality. Soft tissues are unremarkable. Patellar enthesophyte. IMPRESSION: No acute fracture or dislocation of the left knee. Electronically Signed   By: Marin Roberts M.D.   On: 12/29/2022 13:05    Procedures Procedures    Medications Ordered in ED Medications  acetaminophen (TYLENOL) tablet 650 mg (has no administration in time range)    ED Course/ Medical Decision Making/ A&P                           Medical Decision Making Amount and/or Complexity of Data Reviewed Radiology: ordered.  Risk OTC drugs.   81:32 PM  67 year old presenting for fall that occurred today.  Patient is alert and oriented x 3, no acute distress, afebrile, so vital signs.  Physical exam demonstrates superficial abrasion with no foreign bodies on the left knee.   Minimal tenderness and bony tenderness.  No joint laxity.  Leg is neurovascular intact.  X-ray demonstrates no acute fractures.  Stable for discharge home.  Crutches are not required at this time.  Recommended for Motrin, rest, elevation.   Patient in no distress and overall condition improved here in the ED. Detailed discussions were had with the patient regarding current findings, and need for close f/u with PCP or on call doctor. The patient  has been instructed to return immediately if the symptoms worsen in any way for re-evaluation. Patient verbalized understanding and is in agreement with current care plan. All questions answered prior to discharge.         Final Clinical Impression(s) / ED Diagnoses Final diagnoses:  Injury of left knee, initial encounter    Rx / DC Orders ED Discharge Orders     None         Lianne Cure, DO 45/91/36 1322

## 2022-12-29 NOTE — ED Notes (Signed)
Patient verbalizes understanding of discharge instructions. Opportunity for questioning and answers were provided. Patient discharged from ED.  °

## 2023-03-06 ENCOUNTER — Ambulatory Visit (INDEPENDENT_AMBULATORY_CARE_PROVIDER_SITE_OTHER): Payer: Medicare Other | Admitting: Family Medicine

## 2023-03-06 ENCOUNTER — Telehealth: Payer: Self-pay

## 2023-03-06 ENCOUNTER — Encounter: Payer: Self-pay | Admitting: Family Medicine

## 2023-03-06 VITALS — BP 152/82 | HR 56 | Temp 97.7°F | Ht 72.0 in | Wt 229.4 lb

## 2023-03-06 DIAGNOSIS — I129 Hypertensive chronic kidney disease with stage 1 through stage 4 chronic kidney disease, or unspecified chronic kidney disease: Secondary | ICD-10-CM

## 2023-03-06 DIAGNOSIS — E0840 Diabetes mellitus due to underlying condition with diabetic neuropathy, unspecified: Secondary | ICD-10-CM | POA: Diagnosis not present

## 2023-03-06 DIAGNOSIS — Z23 Encounter for immunization: Secondary | ICD-10-CM

## 2023-03-06 LAB — BAYER DCA HB A1C WAIVED: HB A1C (BAYER DCA - WAIVED): 9.5 % — ABNORMAL HIGH (ref 4.8–5.6)

## 2023-03-06 NOTE — Assessment & Plan Note (Deleted)
Running high. Will work on diet and exercise. Recheck 1 month. Call with any concerns.

## 2023-03-06 NOTE — Progress Notes (Signed)
   Care Guide Note  03/06/2023 Name: TALHAH LOCKETT MRN: [id number] DOB: 08-Jul-1956  Referred by: Valerie Roys, DO Reason for referral : Care Coordination (Outreach to schedule with Pharmd )   MCLAIN BRADWAY is a 67 y.o. year old male who is a primary care patient of Valerie Roys, DO. [name] was referred to the pharmacist for assistance related to DM.    Successful contact was made with the patient to discuss pharmacy services including being ready for the pharmacist to call at least 5 minutes before the scheduled appointment time, to have medication bottles and any blood sugar or blood pressure readings ready for review. The patient agreed to meet with the pharmacist via with the pharmacist via telephone visit on (date/time).  03/10/2023  Noreene Larsson, [place], [place] [address] Direct Dial: [phone] [email or web address]

## 2023-03-06 NOTE — Assessment & Plan Note (Signed)
Running high. Will work on diet and exercise. Recheck 1 month. Call with any concerns.

## 2023-03-06 NOTE — Assessment & Plan Note (Signed)
Not under good control with A1c of 8.5. Has not been eating right. Will really watch his diet and we will get pharmacy involved to help with cost of jardiance. He is aware that next step in medication is likely insulin.

## 2023-03-06 NOTE — Progress Notes (Signed)
BP (!) 152/82   Pulse (!) 56   Temp 97.7 F (36.5 C) (Oral)   Ht 6' (1.829 m)   Wt 229 lb 6.4 oz (104.1 kg)   SpO2 96%   BMI 31.11 kg/m    Subjective:    Patient ID: Manuel Burnett, male    DOB: November 23, 1956, 67 y.o.   MRN: [id number]  HPI: Manuel Burnett is a 67 y.o. male  Chief Complaint  Patient presents with   Diabetes    Patient says with his new insurance plan. He will not be able to get the Jardiance any longer. Patient would like to discuss with provider at today's visit.    DIABETES- has been taking his jardiance, but is going to have to stop it. He probably has about a week left Hypoglycemic episodes:no Polydipsia/polyuria: no Visual disturbance: no Chest pain: no Paresthesias: no Glucose Monitoring: no Taking Insulin?: no Blood Pressure Monitoring: not checking Retinal Examination: Up to Date Foot Exam: Up to Date Diabetic Education: Completed Pneumovax:  Given today Influenza: Up to Date Aspirin: no  HYPERTENSION  Hypertension status: controlled  Satisfied with current treatment? yes Duration of hypertension: chronic BP monitoring frequency:  not checking BP medication side effects:  no Medication compliance: excellent compliance Aspirin: no Recurrent headaches: no Visual changes: no Palpitations: no Dyspnea: no Chest pain: no Lower extremity edema: no Dizzy/lightheaded: no   Relevant past medical, surgical, family and social history reviewed and updated as indicated. Interim medical history since our last visit reviewed. Allergies and medications reviewed and updated.  Review of Systems  Constitutional: Negative.   Respiratory: Negative.    Cardiovascular: Negative.   Gastrointestinal: Negative.   Musculoskeletal: Negative.   Psychiatric/Behavioral: Negative.      Per HPI unless specifically indicated above     Objective:    BP (!) 152/82   Pulse (!) 56   Temp 97.7 F (36.5 C) (Oral)   Ht 6' (1.829 m)   Wt 229 lb 6.4 oz  (104.1 kg)   SpO2 96%   BMI 31.11 kg/m   Wt Readings from Last 3 Encounters:  03/06/23 229 lb 6.4 oz (104.1 kg)  12/29/22 218 lb (98.9 kg)  12/05/22 230 lb 1.6 oz (104.4 kg)    Physical Exam Vitals and nursing note reviewed.  Constitutional:      General: He is not in acute distress.    Appearance: Normal appearance. He is not ill-appearing, toxic-appearing or diaphoretic.  HENT:     Head: Normocephalic and atraumatic.     Right Ear: External ear normal.     Left Ear: External ear normal.     Nose: Nose normal.     Mouth/Throat:     Mouth: Mucous membranes are moist.     Pharynx: Oropharynx is clear.  Eyes:     General: No scleral icterus.       Right eye: No discharge.        Left eye: No discharge.     Extraocular Movements: Extraocular movements intact.     Conjunctiva/sclera: Conjunctivae normal.     Pupils: Pupils are equal, round, and reactive to light.  Cardiovascular:     Rate and Rhythm: Normal rate and regular rhythm.     Pulses: Normal pulses.     Heart sounds: Normal heart sounds. No murmur heard.    No friction rub. No gallop.  Pulmonary:     Effort: Pulmonary effort is normal. No respiratory distress.     Breath  sounds: Normal breath sounds. No stridor. No wheezing, rhonchi or rales.  Chest:     Chest wall: No tenderness.  Musculoskeletal:        General: Normal range of motion.     Cervical back: Normal range of motion and neck supple.  Skin:    General: Skin is warm and dry.     Capillary Refill: Capillary refill takes less than 2 seconds.     Coloration: Skin is not jaundiced or pale.     Findings: No bruising, erythema, lesion or rash.  Neurological:     General: No focal deficit present.     Mental Status: He is alert and oriented to person, place, and time. Mental status is at baseline.  Psychiatric:        Mood and Affect: Mood normal.        Behavior: Behavior normal.        Thought Content: Thought content normal.        Judgment: Judgment  normal.     Results for orders placed or performed in visit on 12/28/22  HM DIABETES EYE EXAM  Result Value Ref Range   HM Diabetic Eye Exam No Retinopathy No Retinopathy      Assessment & Plan:   Problem List Items Addressed This Visit       Endocrine   Diabetes mellitus due to underlying condition with diabetic neuropathy ([place]) - Primary    Not under good control with A1c of 8.5. Has not been eating right. Will really watch his diet and we will get pharmacy involved to help with cost of jardiance. He is aware that next step in medication is likely insulin.       Relevant Orders   [employer] DCA Hb A1c Waived (STAT)   AMB Referral to Pharmacy Medication Management     Genitourinary   Benign hypertensive renal disease    Running high. Will work on diet and exercise. Recheck 1 month. Call with any concerns.       Other Visit Diagnoses     Need for vaccination for pneumococcus       Prevnar 20 given today.   Relevant Orders   Pneumococcal conjugate vaccine 20-valent (Prevnar 20)        Follow up plan: Return in about 4 weeks (around 04/03/2023) for welcome to medicare.

## 2023-03-10 ENCOUNTER — Other Ambulatory Visit: Payer: Medicare Other

## 2023-03-13 NOTE — Progress Notes (Signed)
   03/13/2023  Patient ID: [name], male   DOB: Nov 08, 1956, 67 y.o.   MRN: [id number]  Subjective/Objective: Referral to pharmacy from Dr. Wynetta Emery, Mr. Manuella Ghazi PCP, for assistance with the affordability of Jardiance.  DM and Medication Management -Patient states he went to the pharmacy to pick up medications on 3/22 and was unable to afford all medications that were ready because his cost was going to be >$800 -States the [place] alone >$500 and he cannot afford -Currently taking metformin 1000mg  BID, glipizide 5mg  BID, and Jardiance 25mg  daily -Was prescribed pioglitazone in the past, but that was changed to the Jardiance -Most recent A1c 9.5 and Glucose 143 ib 03/06/23  Assessment/Plan:  DM -Contacted insurance and verified patient has $545 deductible for Tier 3-6 drugs that must be met prior to copays being applicable.  Nothing has gone toward this deductible so far; but once it is met, Vania Rea would be $11/30d.  First 30 day fill would cost patient $528, and second fill would be remaining deductibe amount (approximately $20) plus the $11 copay.  Future fills would be $11. -Patient would not be eligible for PAP based on household income, and being a Medicare part D beneficiary means he cannot use a manufacturer copay coupon, either. -Also able to identify other medications that are tier 3+ that will be affected by this deductible:  albuterol inhaler, clobetasol solution and cream, fenofibrate, sildenafil, and testosterone -Contacted patient to make aware and determine affordability of this for him but had to leave voice message for him to call me back.  Follow-Up: Will attempt contact again in 2-3 days if I have not heard back.  Darlina Guys, PharmD, DPLA

## 2023-03-17 ENCOUNTER — Telehealth: Payer: Self-pay

## 2023-03-17 NOTE — Progress Notes (Signed)
   03/17/2023  Patient ID: Manuel Burnett, male   DOB: 29-May-1956, 67 y.o.   MRN: [id number]  Contact made to discuss details of insurance coverage.  I was able to inform [name] of his deductible that is specific to certain tiers of medications, and what the monthly copay of Jardiance would be until that is met.  However, he did receive a phone call regarding a family emergency and conversation was not completed.  Sending a message in [place] will my direct number, so he can call me next week at his convenience.  Darlina Guys, PharmD, DPLA

## 2023-04-07 ENCOUNTER — Encounter: Payer: Medicare Other | Admitting: Family Medicine

## 2023-04-17 ENCOUNTER — Encounter: Payer: Self-pay | Admitting: Family Medicine

## 2023-04-17 ENCOUNTER — Ambulatory Visit (INDEPENDENT_AMBULATORY_CARE_PROVIDER_SITE_OTHER): Payer: Medicare Other | Admitting: Family Medicine

## 2023-04-17 VITALS — BP 148/74 | HR 64 | Temp 98.7°F | Ht 73.0 in | Wt 233.1 lb

## 2023-04-17 DIAGNOSIS — F17210 Nicotine dependence, cigarettes, uncomplicated: Secondary | ICD-10-CM

## 2023-04-17 DIAGNOSIS — R3911 Hesitancy of micturition: Secondary | ICD-10-CM | POA: Diagnosis not present

## 2023-04-17 DIAGNOSIS — Z136 Encounter for screening for cardiovascular disorders: Secondary | ICD-10-CM

## 2023-04-17 DIAGNOSIS — I152 Hypertension secondary to endocrine disorders: Secondary | ICD-10-CM

## 2023-04-17 DIAGNOSIS — Z7189 Other specified counseling: Secondary | ICD-10-CM | POA: Insufficient documentation

## 2023-04-17 DIAGNOSIS — E782 Mixed hyperlipidemia: Secondary | ICD-10-CM

## 2023-04-17 DIAGNOSIS — N2581 Secondary hyperparathyroidism of renal origin: Secondary | ICD-10-CM

## 2023-04-17 DIAGNOSIS — E039 Hypothyroidism, unspecified: Secondary | ICD-10-CM

## 2023-04-17 DIAGNOSIS — Z7984 Long term (current) use of oral hypoglycemic drugs: Secondary | ICD-10-CM

## 2023-04-17 DIAGNOSIS — I129 Hypertensive chronic kidney disease with stage 1 through stage 4 chronic kidney disease, or unspecified chronic kidney disease: Secondary | ICD-10-CM

## 2023-04-17 DIAGNOSIS — F17200 Nicotine dependence, unspecified, uncomplicated: Secondary | ICD-10-CM

## 2023-04-17 DIAGNOSIS — E0840 Diabetes mellitus due to underlying condition with diabetic neuropathy, unspecified: Secondary | ICD-10-CM | POA: Diagnosis not present

## 2023-04-17 DIAGNOSIS — E291 Testicular hypofunction: Secondary | ICD-10-CM

## 2023-04-17 DIAGNOSIS — Z0000 Encounter for general adult medical examination without abnormal findings: Secondary | ICD-10-CM | POA: Diagnosis not present

## 2023-04-17 DIAGNOSIS — N182 Chronic kidney disease, stage 2 (mild): Secondary | ICD-10-CM

## 2023-04-17 MED ORDER — LOSARTAN POTASSIUM 100 MG PO TABS: 100.0000 mg | ORAL_TABLET | Freq: Every day | ORAL | 1 refills | Status: DC

## 2023-04-17 NOTE — Progress Notes (Signed)
BP (!) 148/74   Pulse 64   Temp 98.7 F (37.1 C) (Oral)   Ht 6\' 1"  (1.854 m)   Wt 233 lb 1.6 oz (105.7 kg)   SpO2 95%   BMI 30.75 kg/m    Subjective:    Patient ID: Jadene Pierini, male    DOB: 01-18-1956, 67 y.o.   MRN: [id number]  HPI: AYODEJI KEIMIG is a 67 y.o. male presenting on 04/17/2023 for comprehensive medical examination. Current medical complaints include:  HYPERTENSION / HYPERLIPIDEMIA Satisfied with current treatment? yes Duration of hypertension: chronic BP monitoring frequency: not checking BP medication side effects: no Past BP meds: losartan, HCTZ, amlodipine Duration of hyperlipidemia: chronic Cholesterol medication side effects: no Cholesterol supplements: none Past cholesterol medications: crestor Medication compliance: excellent compliance Aspirin: no Recent stressors: no Recurrent headaches: no Visual changes: no Palpitations: no Dyspnea: no Chest pain: no Lower extremity edema: no Dizzy/lightheaded: no  DIABETES Hypoglycemic episodes:no Polydipsia/polyuria: no Visual disturbance: no Chest pain: no Paresthesias: no Glucose Monitoring: yes  Accucheck frequency: occasionally Taking Insulin?: no Blood Pressure Monitoring: not checking Retinal Examination: Up to Date Foot Exam: Up to Date Diabetic Education: Completed Pneumovax: Up to Date Influenza: Up to Date Aspirin: no  He currently lives with: wife Interim Problems from his last visit: no  Functional Status Survey: Is the patient deaf or have difficulty hearing?: No Does the patient have difficulty seeing, even when wearing glasses/contacts?: No Does the patient have difficulty concentrating, remembering, or making decisions?: No Does the patient have difficulty walking or climbing stairs?: Yes Does the patient have difficulty dressing or bathing?: No Does the patient have difficulty doing errands alone such as visiting a doctor's office or shopping?: No  FALL RISK:     04/17/2023    2:40 PM 03/06/2023    8:23 AM 09/05/2022    8:29 AM 04/12/2022    8:45 AM 11/10/2021    8:42 AM  Fall Risk   Falls in the past year? 0 1 0 0 0  Number falls in past yr: 0 0 0 0 0  Injury with Fall? 0 1 0 0 0  Risk for fall due to : Impaired mobility History of fall(s) No Fall Risks No Fall Risks No Fall Risks  Follow up Falls evaluation completed Falls evaluation completed Falls evaluation completed Falls evaluation completed Falls evaluation completed    Depression Screen    04/17/2023    2:39 PM 12/05/2022    8:43 AM 09/05/2022    8:28 AM 07/05/2022    9:39 AM 05/31/2022    8:46 AM  Depression screen PHQ 2/9  Decreased Interest 0 0 0 0 0  Down, Depressed, Hopeless 0 0 0 0 0  PHQ - 2 Score 0 0 0 0 0  Altered sleeping  0 0 0 0  Tired, decreased energy  0 0 0 0  Change in appetite  0 0 0 0  Feeling bad or failure about yourself   0 0 0 0  Trouble concentrating  0 0 0 0  Moving slowly or fidgety/restless  0 0 0 0  Suicidal thoughts  0 0 0 0  PHQ-9 Score  0 0 0 0  Difficult doing work/chores  Not difficult at all Not difficult at all Not difficult at all Not difficult at all    Advanced Directives Does patient have a HCPOA?    no Does patient have a living will or MOST form?  no  Past Medical History:  Past Medical History:  Diagnosis Date   Cancer (HCC)    SKIN   Chronic kidney disease    Diabetes mellitus without complication (HCC)    Dysplastic nevus 01/08/2021   right upper back biopsy proven compound dysplastic nevus with mild atypia margins involved    Erectile dysfunction    GERD (gastroesophageal reflux disease)    Hyperlipidemia    Hypertension    Hypogonadism male    Hypothyroidism    Proteinuria    Sleep apnea    CPAP   Thyroid disease     Surgical History:  Past Surgical History:  Procedure Laterality Date   CATARACT EXTRACTION W/PHACO Right 12/26/2017   Procedure: CATARACT EXTRACTION PHACO AND INTRAOCULAR LENS PLACEMENT (IOC);  Surgeon:  Galen Manila, MD;  Location: [facility] ORS;  Service: Ophthalmology;  Laterality: Right;  [place] 00:21.8 AP% 21.1 CDE 2.64 FLUID PACK LOT # [id number]   CATARACT EXTRACTION W/PHACO Left 01/09/2018   Procedure: CATARACT EXTRACTION PHACO AND INTRAOCULAR LENS PLACEMENT (IOC);  Surgeon: Galen Manila, MD;  Location: [facility] ORS;  Service: Ophthalmology;  Laterality: Left;  [place] 00:28.9 AP% 10.0 CDE 2.90 Fluid pcak lot # [id number]   CLAVICLE SURGERY     internal fixation   COLONOSCOPY WITH PROPOFOL N/A 04/06/2020   Procedure: COLONOSCOPY WITH PROPOFOL;  Surgeon: Toney Reil, MD;  Location: [facility];  Service: Gastroenterology;  Laterality: N/A;  priority 4   EYE SURGERY Bilateral 12/2017   Cataract   FRACTURE SURGERY     HEMORRHOID SURGERY     TONSILLECTOMY     TONSILLECTOMY AND ADENOIDECTOMY      Medications:  Current Outpatient Medications on File Prior to Visit  Medication Sig   albuterol (PROAIR HFA) 108 (90 Base) MCG/ACT inhaler Inhale 2 puffs into the lungs every 4 (four) hours as needed for wheezing or shortness of breath.   amLODipine (NORVASC) 10 MG tablet Take 1 tablet (10 mg total) by mouth daily.   B-D INTEGRA SYRINGE 22G X 1-1/2" 3 ML MISC Inject testosterone 1x every 2 weeks   BD HYPODERMIC NEEDLE 18G X 1" MISC INJECT TESTOSTERONE EVERY 2 WEEKS AS DIRECTED   clobetasol (TEMOVATE) 0.05 % external solution Apply topically.   clobetasol cream (TEMOVATE) 0.05 % SMARTSIG:2 Topical Twice Daily   empagliflozin (JARDIANCE) 25 MG TABS tablet Take 1 tablet (25 mg total) by mouth daily before breakfast.   fenofibrate 160 MG tablet Take 1 tablet (160 mg total) by mouth daily. with food   glipiZIDE (GLUCOTROL) 5 MG tablet Take 1 tablet (5 mg total) by mouth 2 (two) times daily before a meal.   glucose blood (ACCU-CHEK GUIDE) test strip Use as instructed   hydrochlorothiazide (HYDRODIURIL) 25 MG tablet Take 1 tablet (25 mg total) by mouth daily.   ketoconazole (NIZORAL) 2 % shampoo  SMARTSIG:Topical 2-3 Times Weekly   levothyroxine (SYNTHROID) 200 MCG tablet Take 1 tablet (200 mcg total) by mouth daily before breakfast. To be taken with 75mg  for total.   levothyroxine (SYNTHROID) 75 MCG tablet Take 1 tablet (75 mcg total) by mouth daily. To be taken with 200mg  for total.   metFORMIN (GLUCOPHAGE-XR) 500 MG 24 hr tablet TAKE 2 TABLETS(1000 MG) BY MOUTH IN THE MORNING AND AT BEDTIME   Multiple Vitamin (MULTIVITAMIN WITH MINERALS) TABS tablet Take 1 tablet by mouth daily.   pioglitazone (ACTOS) 30 MG tablet Take 1 tablet (30 mg total) by mouth daily.   rosuvastatin (CRESTOR) 10 MG tablet TAKE 1 TABLET(10 MG) BY MOUTH  DAILY   sildenafil (VIAGRA) 100 MG tablet Take 1 tablet (100 mg total) by mouth daily as needed for erectile dysfunction.   testosterone cypionate (DEPOTESTOSTERONE CYPIONATE) 200 MG/ML injection ADMINISTER 0.5 ML(100 MG) IN THE MUSCLE EVERY 14 DAYS   No current facility-administered medications on file prior to visit.    Allergies:  Allergies  Allergen Reactions   Codeine Sulfate Rash   Ozempic (0.25 Or 0.5 Mg-Dose) [Semaglutide(0.25 Or 0.5mg -Dos)] Rash   Semaglutide Rash   Trulicity [Dulaglutide] Rash    Social History:  Social History   Socioeconomic History   Marital status: Married    Spouse name: Not on file   Number of children: Not on file   Years of education: Not on file   Highest education level: Not on file  Occupational History   Not on file  Tobacco Use   Smoking status: Every Day    Packs/day: 0.50    Years: 53.00    Additional pack years: 0.00    Total pack years: 26.50    Types: Cigarettes    Start date: 12/19/1969   Smokeless tobacco: Never  Vaping Use   Vaping Use: Never used  Substance and Sexual Activity   Alcohol use: Not Currently    Alcohol/week: 0.0 standard drinks of alcohol   Drug use: No   Sexual activity: Not Currently  Other Topics Concern   Not on file  Social History Narrative   Not on file    Social Determinants of Health   Financial Resource Strain: Not on file  Food Insecurity: Not on file  Transportation Needs: Not on file  Physical Activity: Not on file  Stress: Not on file  Social Connections: Not on file  Intimate Partner Violence: Not on file   Social History   Tobacco Use  Smoking Status Every Day   Packs/day: 0.50   Years: 53.00   Additional pack years: 0.00   Total pack years: 26.50   Types: Cigarettes   Start date: 12/19/1969  Smokeless Tobacco Never   Social History   Substance and Sexual Activity  Alcohol Use Not Currently   Alcohol/week: 0.0 standard drinks of alcohol    Family History:  Family History  Problem Relation Age of Onset   Hypertension Mother    Heart disease Father    Heart attack Father    CAD Maternal Grandmother    Cancer Maternal Grandfather     Past medical history, surgical history, medications, allergies, family history and social history reviewed with patient today and changes made to appropriate areas of the chart.   Review of Systems  Constitutional: Negative.   HENT: Negative.    Eyes: Negative.   Respiratory: Negative.    Cardiovascular: Negative.   Gastrointestinal: Negative.   Genitourinary: Negative.   Musculoskeletal: Negative.   Skin: Negative.   Neurological: Negative.   Endo/Heme/Allergies: Negative.   Psychiatric/Behavioral: Negative.     All other ROS negative except what is listed above and in the HPI.      Objective:    BP (!) 148/74   Pulse 64   Temp 98.7 F (37.1 C) (Oral)   Ht 6\' 1"  (1.854 m)   Wt 233 lb 1.6 oz (105.7 kg)   SpO2 95%   BMI 30.75 kg/m   Wt Readings from Last 3 Encounters:  04/17/23 233 lb 1.6 oz (105.7 kg)  03/06/23 229 lb 6.4 oz (104.1 kg)  12/29/22 218 lb (98.9 kg)    Hearing Screening   500Hz  1000Hz   2000Hz  4000Hz   Right ear Fail 40 40 40  Left ear Fail 40 40 40   Vision Screening   Right eye Left eye Both eyes  Without correction 20/25 20/25 20/25    With correction 20/20 20/20 20/20     Physical Exam Vitals and nursing note reviewed.  Constitutional:      General: He is not in acute distress.    Appearance: Normal appearance. He is obese. He is not ill-appearing, toxic-appearing or diaphoretic.  HENT:     Head: Normocephalic and atraumatic.     Right Ear: Tympanic membrane, ear canal and external ear normal. There is no impacted cerumen.     Left Ear: Tympanic membrane, ear canal and external ear normal. There is no impacted cerumen.     Nose: Nose normal. No congestion or rhinorrhea.     Mouth/Throat:     Mouth: Mucous membranes are moist.     Pharynx: Oropharynx is clear. No oropharyngeal exudate or posterior oropharyngeal erythema.  Eyes:     General: No scleral icterus.       Right eye: No discharge.        Left eye: No discharge.     Extraocular Movements: Extraocular movements intact.     Conjunctiva/sclera: Conjunctivae normal.     Pupils: Pupils are equal, round, and reactive to light.  Neck:     Vascular: No carotid bruit.  Cardiovascular:     Rate and Rhythm: Normal rate and regular rhythm.     Pulses: Normal pulses.     Heart sounds: No murmur heard.    No friction rub. No gallop.  Pulmonary:     Effort: Pulmonary effort is normal. No respiratory distress.     Breath sounds: Normal breath sounds. No stridor. No wheezing, rhonchi or rales.  Chest:     Chest wall: No tenderness.  Abdominal:     General: Abdomen is flat. Bowel sounds are normal. There is no distension.     Palpations: Abdomen is soft. There is no mass.     Tenderness: There is no abdominal tenderness. There is no right CVA tenderness, left CVA tenderness, guarding or rebound.     Hernia: No hernia is present.  Genitourinary:    Comments: Genital exam deferred with shared decision making Musculoskeletal:        General: No swelling, tenderness, deformity or signs of injury.     Cervical back: Normal range of motion and neck supple. No  rigidity. No muscular tenderness.     Right lower leg: No edema.     Left lower leg: No edema.  Lymphadenopathy:     Cervical: No cervical adenopathy.  Skin:    General: Skin is warm and dry.     Capillary Refill: Capillary refill takes less than 2 seconds.     Coloration: Skin is not jaundiced or pale.     Findings: No bruising, erythema, lesion or rash.  Neurological:     General: No focal deficit present.     Mental Status: He is alert and oriented to person, place, and time.     Cranial Nerves: No cranial nerve deficit.     Sensory: No sensory deficit.     Motor: No weakness.     Coordination: Coordination normal.     Gait: Gait normal.     Deep Tendon Reflexes: Reflexes normal.  Psychiatric:        Mood and Affect: Mood normal.        Behavior: Behavior normal.  Thought Content: Thought content normal.        Judgment: Judgment normal.        04/17/2023    2:41 PM  6CIT Screen  What Year? 0 points  What month? 0 points  What time? 0 points  Count back from 20 0 points  Months in reverse 0 points  Repeat phrase 2 points  Total Score 2 points    Results for orders placed or performed in visit on 03/06/23  Bayer DCA Hb A1c Waived (STAT)  Result Value Ref Range   HB A1C (BAYER DCA - WAIVED) 9.5 (H) 4.8 - 5.6 %      Assessment & Plan:   Problem List Items Addressed This Visit       Cardiovascular and Mediastinum   Hypertension associated with diabetes (HCC)    Under good control on current regimen. Continue current regimen. Continue to monitor. Call with any concerns. Refills up to date. Labs to be drawn this week.        Relevant Medications   losartan (COZAAR) 100 MG tablet     Endocrine   Diabetes mellitus due to underlying condition with diabetic neuropathy (HCC)    Under good control on current regimen. Continue current regimen. Continue to monitor. Call with any concerns. Refills up to date. Labs to be drawn this week.       Relevant  Medications   losartan (COZAAR) 100 MG tablet   Other Relevant Orders   Bayer DCA Hb A1c Waived   CBC with Differential/Platelet   Comprehensive metabolic panel   Microalbumin, Urine Waived   Male hypogonadism    Under good control on current regimen. Continue current regimen. Continue to monitor. Call with any concerns. Refills up to date. Labs to be drawn this week.       Relevant Orders   Testosterone, free, total([facility])   Hypothyroidism    Under good control on current regimen. Continue current regimen. Continue to monitor. Call with any concerns. Refills up to date. Labs to be drawn this week.       Relevant Orders   CBC with Differential/Platelet   Comprehensive metabolic panel   TSH   Secondary hyperparathyroidism (HCC)    Under good control on current regimen. Continue current regimen. Continue to monitor. Call with any concerns. Refills up to date. Labs to be drawn this week.       Relevant Orders   PTH, Intact and Calcium     Genitourinary   Benign hypertensive renal disease    BP still running high. We will increase his losartan to 100mg  and recheck in 3 months. Call with any concerns.       Relevant Orders   CBC with Differential/Platelet   Comprehensive metabolic panel   Microalbumin, Urine Waived   Urinalysis, Routine w reflex microscopic   Chronic kidney disease, stage II (mild)    Rechecking labs today. Await results.         Other   Hyperlipemia    Under good control on current regimen. Continue current regimen. Continue to monitor. Call with any concerns. Refills up to date. Labs to be drawn this week.       Relevant Medications   losartan (COZAAR) 100 MG tablet   Other Relevant Orders   CBC with Differential/Platelet   Comprehensive metabolic panel   Lipid Panel w/o Chol/HDL Ratio   Advance directive discussed with patient    A voluntary discussion about advance care planning including the explanation and  discussion of advance  directives was extensively discussed  with the patient for 5 minutes with patient present.  Explanation about the health care proxy and Living will was reviewed and packet with forms with explanation of how to fill them out was given.  During this discussion, the patient was able to identify a health care proxy as his wife and plans to fill out the paperwork required.  Patient was offered a separate Advance Care Planning visit for further assistance with forms.         Other Visit Diagnoses     Welcome to Medicare preventive visit    -  Primary   Preventative care discussed today as below.   Relevant Orders   [place] MEDICARE SCREENING   Hesitancy       Will check labs. Await results.   Relevant Orders   PSA   Encounter for screening for cardiovascular disorders       Will refer for low dose CT scan and AAA screening. Await results.   Relevant Orders   EKG 12-Lead (Completed)   [place] MEDICARE SCREENING   Nicotine dependence, cigarettes, uncomplicated       Will refer for low dose CT scan and AAA screening. Await results.   Relevant Orders   [place] MEDICARE SCREENING   Encounter for screening for malignant neoplasm of lung in current smoker with 30 pack year history or greater       Will refer for low dose CT scan. Await results.   Relevant Orders   Ambulatory Referral Lung Cancer Screening [facility]        Preventative Services:  AAA Screening: Ordered today Health Risk Assessment and Personalized Prevention Plan: Done today Bone Mass Measurements: N/A CVD Screening: Done today Colon Cancer Screening: Up to date Depression Screening: Done today Diabetes Screening: Done today Glaucoma Screening: See your eye doctor Hepatitis B vaccine: N/A Hepatitis C screening: Up to date HIV Screening: Up to date Flu Vaccine: Get in the Fall Lung cancer Screening: Ordered today Obesity Screening: Done today Pneumonia Vaccines (2): Up to date STI Screening: N/A PSA  screening: Done today  LABORATORY TESTING:  Health maintenance labs ordered today as discussed above.   The natural history of prostate cancer and ongoing controversy regarding screening and potential treatment outcomes of prostate cancer has been discussed with the patient. The meaning of a false positive PSA and a false negative PSA has been discussed. He indicates understanding of the limitations of this screening test and wishes to proceed with screening PSA testing.   IMMUNIZATIONS:   - Tdap: Tetanus vaccination status reviewed: last tetanus booster within 10 years. - Influenza: Postponed to flu season - Pneumovax: Up to date - Prevnar: Up to date - Zostavax vaccine: Up to date  SCREENING: - Colonoscopy: Up to date  Discussed with patient purpose of the colonoscopy is to detect colon cancer at curable precancerous or early stages   - AAA Screening: Ordered today  -Hearing Test: Ordered today    PATIENT COUNSELING:    Sexuality: Discussed sexually transmitted diseases, partner selection, use of condoms, avoidance of unintended pregnancy  and contraceptive alternatives.   Advised to avoid cigarette smoking.  I discussed with the patient that most people either abstain from alcohol or drink within safe limits (<=14/week and <=4 drinks/occasion for males, <=7/weeks and <= 3 drinks/occasion for females) and that the risk for alcohol disorders and other health effects rises proportionally with the number of drinks per week and  how often a drinker exceeds daily limits.  Discussed cessation/primary prevention of drug use and availability of treatment for abuse.   Diet: Encouraged to adjust caloric intake to maintain  or achieve ideal body weight, to reduce intake of dietary saturated fat and total fat, to limit sodium intake by avoiding high sodium foods and not adding table salt, and to maintain adequate dietary potassium and calcium preferably from fresh fruits, vegetables, and  low-fat dairy products.    stressed the importance of regular exercise  Injury prevention: Discussed safety belts, safety helmets, smoke detector, smoking near bedding or upholstery.   Dental health: Discussed importance of regular tooth brushing, flossing, and dental visits.   Follow up plan: NEXT PREVENTATIVE PHYSICAL DUE IN 1 YEAR. Return in about 3 months (around 07/17/2023).

## 2023-04-17 NOTE — Assessment & Plan Note (Signed)
Under good control on current regimen. Continue current regimen. Continue to monitor. Call with any concerns. Refills up to date. Labs to be drawn this week.  

## 2023-04-17 NOTE — Patient Instructions (Signed)
Preventative Services:  AAA Screening: Ordered today Health Risk Assessment and Personalized Prevention Plan: Done today Bone Mass Measurements: N/A CVD Screening: Done today Colon Cancer Screening: Up to date Depression Screening: Done today Diabetes Screening: Done today Glaucoma Screening: See your eye doctor Hepatitis B vaccine: N/A Hepatitis C screening: Up to date HIV Screening: Up to date Flu Vaccine: Get in the Fall Lung cancer Screening: Ordered today Obesity Screening: Done today Pneumonia Vaccines (2): Up to date STI Screening: N/A PSA screening: Done today

## 2023-04-17 NOTE — Assessment & Plan Note (Signed)
A voluntary discussion about advance care planning including the explanation and discussion of advance directives was extensively discussed  with the patient for 5 minutes with patient present.  Explanation about the health care proxy and Living will was reviewed and packet with forms with explanation of how to fill them out was given.  During this discussion, the patient was able to identify a health care proxy as his wife and plans to fill out the paperwork required.  Patient was offered a separate Advance Care Planning visit for further assistance with forms.    

## 2023-04-17 NOTE — Assessment & Plan Note (Signed)
Under good control on current regimen. Continue current regimen. Continue to monitor. Call with any concerns. Refills up to date. Labs to be drawn this week.

## 2023-04-17 NOTE — Assessment & Plan Note (Signed)
Rechecking labs today. Await results.  

## 2023-04-17 NOTE — Assessment & Plan Note (Addendum)
BP still running high. We will increase his losartan to 100mg  and recheck in 3 months. Call with any concerns.

## 2023-04-21 ENCOUNTER — Other Ambulatory Visit: Payer: Medicare Other

## 2023-04-21 DIAGNOSIS — E0840 Diabetes mellitus due to underlying condition with diabetic neuropathy, unspecified: Secondary | ICD-10-CM

## 2023-04-21 DIAGNOSIS — E782 Mixed hyperlipidemia: Secondary | ICD-10-CM

## 2023-04-21 DIAGNOSIS — R3911 Hesitancy of micturition: Secondary | ICD-10-CM

## 2023-04-21 DIAGNOSIS — E039 Hypothyroidism, unspecified: Secondary | ICD-10-CM

## 2023-04-21 DIAGNOSIS — I129 Hypertensive chronic kidney disease with stage 1 through stage 4 chronic kidney disease, or unspecified chronic kidney disease: Secondary | ICD-10-CM

## 2023-04-21 DIAGNOSIS — N2581 Secondary hyperparathyroidism of renal origin: Secondary | ICD-10-CM

## 2023-04-21 DIAGNOSIS — E291 Testicular hypofunction: Secondary | ICD-10-CM

## 2023-04-21 LAB — MICROALBUMIN, URINE WAIVED
Creatinine, Urine Waived: 50 mg/dL (ref 10–300)
Microalb, Ur Waived: 150 mg/L — ABNORMAL HIGH (ref 0–19)
Microalb/Creat Ratio: >300 mg/g — ABNORMAL HIGH (ref <30)

## 2023-04-21 LAB — BAYER DCA HB A1C WAIVED: HB A1C (BAYER DCA - WAIVED): 9.3 % — ABNORMAL HIGH (ref 4.8–5.6)

## 2023-04-21 LAB — MICROSCOPIC EXAMINATION
Bacteria, UA: NONE SEEN
Epithelial Cells (non renal): NONE SEEN /hpf (ref 0–10)
RBC, Urine: NONE SEEN /hpf (ref 0–2)
WBC, UA: NONE SEEN /hpf (ref 0–5)

## 2023-04-21 LAB — URINALYSIS, ROUTINE W REFLEX MICROSCOPIC
Bilirubin, UA: NEGATIVE
Ketones, UA: NEGATIVE
Leukocytes,UA: NEGATIVE
Nitrite, UA: NEGATIVE
RBC, UA: NEGATIVE
Specific Gravity, UA: 1.025 (ref 1.005–1.030)
Urobilinogen, Ur: 0.2 mg/dL (ref 0.2–1.0)
pH, UA: 7 (ref 5.0–7.5)

## 2023-04-24 ENCOUNTER — Ambulatory Visit (INDEPENDENT_AMBULATORY_CARE_PROVIDER_SITE_OTHER): Payer: Medicare Other | Admitting: Internal Medicine

## 2023-04-24 VITALS — BP 162/95 | HR 70 | Resp 16 | Ht 73.0 in | Wt 228.0 lb

## 2023-04-24 DIAGNOSIS — G4733 Obstructive sleep apnea (adult) (pediatric): Secondary | ICD-10-CM

## 2023-04-24 DIAGNOSIS — Z7189 Other specified counseling: Secondary | ICD-10-CM

## 2023-04-24 DIAGNOSIS — E1159 Type 2 diabetes mellitus with other circulatory complications: Secondary | ICD-10-CM | POA: Diagnosis not present

## 2023-04-24 DIAGNOSIS — I152 Hypertension secondary to endocrine disorders: Secondary | ICD-10-CM | POA: Diagnosis not present

## 2023-04-24 NOTE — Patient Instructions (Signed)

## 2023-04-24 NOTE — Progress Notes (Signed)
[facility] [address] [place], [place] [address]  Pulmonary Sleep Medicine   Office Visit Note  Patient Name: Manuel Burnett DOB: January 27, 1956 MRN [id number]    Chief Complaint: Obstructive Sleep Apnea visit  Brief History:  Manuel Burnett is seen today for follow up on CPAP at 10 cmh20.  The patient has a 13 year history of sleep apnea. Patient is using PAP nightly.  The patient feels rested after sleeping with PAP.  The patient reports benefiting from PAP use. Reported sleepiness is  improved and the Epworth Sleepiness Score is 8 out of 24. Prior to going on CPAP the patient had snoring, morning headaches. The patient does not take naps. The patient complains of the following: No complaints.  The compliance download shows 100% compliance with an average use time of 7:19 hours. The AHI is 1.0.  The patient does not complain of limb movements disrupting sleep.  ROS  General: (-) fever, (-) chills, (-) night sweat Nose and Sinuses: (-) nasal stuffiness or itchiness, (-) postnasal drip, (-) nosebleeds, (-) sinus trouble. Mouth and Throat: (-) sore throat, (-) hoarseness. Neck: (-) swollen glands, (-) enlarged thyroid, (-) neck pain. Respiratory: - cough, - shortness of breath, - wheezing. Neurologic: - numbness, - tingling. Psychiatric: - anxiety, - depression   Current Medication: Outpatient Encounter Medications as of 04/24/2023  Medication Sig   albuterol (PROAIR HFA) 108 (90 Base) MCG/ACT inhaler Inhale 2 puffs into the lungs every 4 (four) hours as needed for wheezing or shortness of breath.   amLODipine (NORVASC) 10 MG tablet Take 1 tablet (10 mg total) by mouth daily.   B-D INTEGRA SYRINGE 22G X 1-1/2" 3 ML MISC Inject testosterone 1x every 2 weeks   BD HYPODERMIC NEEDLE 18G X 1" MISC INJECT TESTOSTERONE EVERY 2 WEEKS AS DIRECTED   clobetasol (TEMOVATE) 0.05 % external solution Apply topically.   clobetasol cream (TEMOVATE) 0.05 % SMARTSIG:2 Topical Twice Daily    empagliflozin (JARDIANCE) 25 MG TABS tablet Take 1 tablet (25 mg total) by mouth daily before breakfast.   fenofibrate 160 MG tablet Take 1 tablet (160 mg total) by mouth daily. with food   glipiZIDE (GLUCOTROL) 5 MG tablet Take 1 tablet (5 mg total) by mouth 2 (two) times daily before a meal.   glucose blood (ACCU-CHEK GUIDE) test strip Use as instructed   hydrochlorothiazide (HYDRODIURIL) 25 MG tablet Take 1 tablet (25 mg total) by mouth daily.   ketoconazole (NIZORAL) 2 % shampoo SMARTSIG:Topical 2-3 Times Weekly   levothyroxine (SYNTHROID) 200 MCG tablet Take 1 tablet (200 mcg total) by mouth daily before breakfast. To be taken with 75mg  for total.   levothyroxine (SYNTHROID) 75 MCG tablet Take 1 tablet (75 mcg total) by mouth daily. To be taken with 200mg  for total.   losartan (COZAAR) 100 MG tablet Take 1 tablet (100 mg total) by mouth daily.   metFORMIN (GLUCOPHAGE-XR) 500 MG 24 hr tablet TAKE 2 TABLETS(1000 MG) BY MOUTH IN THE MORNING AND AT BEDTIME   Multiple Vitamin (MULTIVITAMIN WITH MINERALS) TABS tablet Take 1 tablet by mouth daily.   pioglitazone (ACTOS) 30 MG tablet Take 1 tablet (30 mg total) by mouth daily.   rosuvastatin (CRESTOR) 10 MG tablet TAKE 1 TABLET(10 MG) BY MOUTH DAILY   sildenafil (VIAGRA) 100 MG tablet Take 1 tablet (100 mg total) by mouth daily as needed for erectile dysfunction.   testosterone cypionate (DEPOTESTOSTERONE CYPIONATE) 200 MG/ML injection ADMINISTER 0.5 ML(100 MG) IN THE MUSCLE EVERY 14  DAYS   No facility-administered encounter medications on file as of 04/24/2023.    Surgical History: Past Surgical History:  Procedure Laterality Date   CATARACT EXTRACTION W/PHACO Right 12/26/2017   Procedure: CATARACT EXTRACTION PHACO AND INTRAOCULAR LENS PLACEMENT (IOC);  Surgeon: Galen Manila, MD;  Location: [facility] ORS;  Service: Ophthalmology;  Laterality: Right;  [place] 00:21.8 AP% 21.1 CDE 2.64 FLUID PACK LOT # [id number]   CATARACT EXTRACTION  W/PHACO Left 01/09/2018   Procedure: CATARACT EXTRACTION PHACO AND INTRAOCULAR LENS PLACEMENT (IOC);  Surgeon: Galen Manila, MD;  Location: [facility] ORS;  Service: Ophthalmology;  Laterality: Left;  [place] 00:28.9 AP% 10.0 CDE 2.90 Fluid pcak lot # [id number]   CLAVICLE SURGERY     internal fixation   COLONOSCOPY WITH PROPOFOL N/A 04/06/2020   Procedure: COLONOSCOPY WITH PROPOFOL;  Surgeon: Toney Reil, MD;  Location: [facility] ENDOSCOPY;  Service: Gastroenterology;  Laterality: N/A;  priority 4   EYE SURGERY Bilateral 12/2017   Cataract   FRACTURE SURGERY     HEMORRHOID SURGERY     TONSILLECTOMY     TONSILLECTOMY AND ADENOIDECTOMY      Medical History: Past Medical History:  Diagnosis Date   Cancer (HCC)    SKIN   Chronic kidney disease    Diabetes mellitus without complication (HCC)    Dysplastic nevus 01/08/2021   right upper back biopsy proven compound dysplastic nevus with mild atypia margins involved    Erectile dysfunction    GERD (gastroesophageal reflux disease)    Hyperlipidemia    Hypertension    Hypogonadism male    Hypothyroidism    Proteinuria    Sleep apnea    CPAP   Thyroid disease     Family History: Non contributory to the present illness  Social History: Social History   Socioeconomic History   Marital status: Married    Spouse name: Not on file   Number of children: Not on file   Years of education: Not on file   Highest education level: Not on file  Occupational History   Not on file  Tobacco Use   Smoking status: Every Day    Packs/day: 0.50    Years: 53.00    Additional pack years: 0.00    Total pack years: 26.50    Types: Cigarettes    Start date: 12/19/1969   Smokeless tobacco: Never  Vaping Use   Vaping Use: Never used  Substance and Sexual Activity   Alcohol use: Not Currently    Alcohol/week: 0.0 standard drinks of alcohol   Drug use: No   Sexual activity: Not Currently  Other Topics Concern   Not on file  Social History  Narrative   Not on file   Social Determinants of Health   Financial Resource Strain: Not on file  Food Insecurity: Not on file  Transportation Needs: Not on file  Physical Activity: Not on file  Stress: Not on file  Social Connections: Not on file  Intimate Partner Violence: Not on file    Vital Signs: Blood pressure (!) 162/95, pulse 70, resp. rate 16, height 6\' 1"  (1.854 m), weight 228 lb (103.4 kg), SpO2 97 %. Body mass index is 30.08 kg/m.    Examination: General Appearance: The patient is well-developed, well-nourished, and in no distress. Neck Circumference: 45 cm Skin: Gross inspection of skin unremarkable. Head: normocephalic, no gross deformities. Eyes: no gross deformities noted. ENT: ears appear grossly normal Neurologic: Alert and oriented. No involuntary movements.  STOP BANG RISK ASSESSMENT S (snore)  Have you been told that you snore?     NO   T (tired) Are you often tired, fatigued, or sleepy during the day?   NO  O (obstruction) Do you stop breathing, choke, or gasp during sleep? NO   P (pressure) Do you have or are you being treated for high blood pressure? YES   B (BMI) Is your body index greater than 35 kg/m? NO   A (age) Are you 65 years old or older? YES   N (neck) Do you have a neck circumference greater than 16 inches?   YES   G (gender) Are you a male? YES   TOTAL STOP/BANG "YES" ANSWERS 4       A STOP-Bang score of 2 or less is considered low risk, and a score of 5 or more is high risk for having either moderate or severe OSA. For people who score 3 or 4, doctors may need to perform further assessment to determine how likely they are to have OSA.         EPWORTH SLEEPINESS SCALE:  Scale:  (0)= no chance of dozing; (1)= slight chance of dozing; (2)= moderate chance of dozing; (3)= high chance of dozing  Chance  Situtation    Sitting and reading: 2    Watching TV: 1    Sitting Inactive in public: 1    As a passenger in car: 0       Lying down to rest: 3    Sitting and talking: 0    Sitting quielty after lunch: 1    In a car, stopped in traffic: 0   TOTAL SCORE:   8 out of 24    SLEEP STUDIES:  SPLIT (09/14/09) AHI 85.6, min SPO2 79%, recommended CPAP at 10 cmh20   CPAP COMPLIANCE DATA:  Date Range: 08/23/22 - 04/19/23  Average Daily Use: 7:19 hours  Median Use: 7:19  Compliance for > 4 Hours: 240 days  AHI: 1.0 respiratory events per hour  Days Used: 240/240  Mask Leak: 24.9  95th Percentile Pressure: 10 cmh20         LABS: Recent Results (from the past 2160 hour(s))  Bayer DCA Hb A1c Waived (STAT)     Status: Abnormal   Collection Time: 03/06/23  8:24 AM  Result Value Ref Range   HB A1C (BAYER DCA - WAIVED) 9.5 (H) 4.8 - 5.6 %    Comment:          Prediabetes: 5.7 - 6.4          Diabetes: >6.4          Glycemic control for adults with diabetes: <7.0   Urinalysis, Routine w reflex microscopic     Status: Abnormal   Collection Time: 04/21/23  8:15 AM  Result Value Ref Range   Specific Gravity, UA 1.025 1.005 - 1.030   pH, UA 7.0 5.0 - 7.5   Color, UA Yellow Yellow   Appearance Ur Clear Clear   Leukocytes,UA Negative Negative   Protein,UA 3+ (A) Negative/Trace   Glucose, UA 2+ (A) Negative   Ketones, UA Negative Negative   RBC, UA Negative Negative   Bilirubin, UA Negative Negative   Urobilinogen, Ur 0.2 0.2 - 1.0 mg/dL   Nitrite, UA Negative Negative   Microscopic Examination See below:   Microalbumin, Urine Waived     Status: Abnormal   Collection Time: 04/21/23  8:15 AM  Result Value Ref Range   Microalb, Ur Waived 150 (H) 0 -  19 mg/L   Creatinine, Urine Waived 50 10 - 300 mg/dL   Microalb/Creat Ratio >300 (H) <30 mg/g    Comment:                              Abnormal:       30 - 300                         High Abnormal:           >300   Bayer DCA Hb A1c Waived     Status: Abnormal   Collection Time: 04/21/23  8:15 AM  Result Value Ref Range   HB A1C (BAYER  DCA - WAIVED) 9.3 (H) 4.8 - 5.6 %    Comment:          Prediabetes: 5.7 - 6.4          Diabetes: >6.4          Glycemic control for adults with diabetes: <7.0   Microscopic Examination     Status: Abnormal   Collection Time: 04/21/23  8:15 AM   Urine  Result Value Ref Range   WBC, UA None seen 0 - 5 /hpf   RBC, Urine None seen 0 - 2 /hpf   Epithelial Cells (non renal) None seen 0 - 10 /hpf   Casts Present (A) None seen /lpf   Cast Type Hyaline casts N/A   Bacteria, UA None seen None seen/Few  TSH     Status: None   Collection Time: 04/21/23  8:19 AM  Result Value Ref Range   TSH 3.730 0.450 - 4.500 uIU/mL  PTH, Intact and Calcium     Status: None   Collection Time: 04/21/23  8:19 AM  Result Value Ref Range   PTH 20 15 - 65 pg/mL   PTH Interp Comment     Comment: Interpretation                 Intact PTH    Calcium                                 (pg/mL)      (mg/dL) Normal                          15 - 65     8.6 - 10.2 Primary Hyperparathyroidism         >65          >10.2 Secondary Hyperparathyroidism       >65          <10.2 Non-Parathyroid Hypercalcemia       <65          >10.2 Hypoparathyroidism                  <15          < 8.6 Non-Parathyroid Hypocalcemia    15 - 65          < 8.6   PSA     Status: None   Collection Time: 04/21/23  8:19 AM  Result Value Ref Range   Prostate Specific Ag, Serum 2.4 0.0 - 4.0 ng/mL    Comment: Roche ECLIA methodology. According to the American Urological Association, Serum PSA should decrease and remain at undetectable levels after radical  prostatectomy. The AUA defines biochemical recurrence as an initial PSA value 0.2 ng/mL or greater followed by a subsequent confirmatory PSA value 0.2 ng/mL or greater. Values obtained with different assay methods or kits cannot be used interchangeably. Results cannot be interpreted as absolute evidence of the presence or absence of malignant disease.   Lipid Panel w/o Chol/HDL Ratio     Status:  Abnormal   Collection Time: 04/21/23  8:19 AM  Result Value Ref Range   Cholesterol, Total 262 (H) 100 - 199 mg/dL   Triglycerides 295 (HH) 0 - 149 mg/dL   HDL 40 >62 mg/dL   VLDL Cholesterol Cal 100 (H) 5 - 40 mg/dL   LDL Chol Calc (NIH) 130 (H) 0 - 99 mg/dL  Comprehensive metabolic panel     Status: Abnormal   Collection Time: 04/21/23  8:19 AM  Result Value Ref Range   Glucose 222 (H) 70 - 99 mg/dL   BUN 15 8 - 27 mg/dL   Creatinine, Ser 8.65 0.76 - 1.27 mg/dL   eGFR 78 >78 IO/NGE/9.52   BUN/Creatinine Ratio 14 10 - 24   Sodium 142 134 - 144 mmol/L   Potassium 3.6 3.5 - 5.2 mmol/L   Chloride 103 96 - 106 mmol/L   CO2 23 20 - 29 mmol/L   Calcium 9.3 8.6 - 10.2 mg/dL   Total Protein 6.7 6.0 - 8.5 g/dL   Albumin 4.1 3.9 - 4.9 g/dL   Globulin, Total 2.6 1.5 - 4.5 g/dL   Albumin/Globulin Ratio 1.6 1.2 - 2.2   Bilirubin Total 0.4 0.0 - 1.2 mg/dL   Alkaline Phosphatase 67 44 - 121 IU/L   AST 13 0 - 40 IU/L   ALT 10 0 - 44 IU/L  CBC with Differential/Platelet     Status: None   Collection Time: 04/21/23  8:19 AM  Result Value Ref Range   WBC 6.5 3.4 - 10.8 x10E3/uL   RBC 5.05 4.14 - 5.80 x10E6/uL   Hemoglobin 14.9 13.0 - 17.7 g/dL   Hematocrit 84.1 32.4 - 51.0 %   MCV 89 79 - 97 fL   MCH 29.5 26.6 - 33.0 pg   MCHC 33.2 31.5 - 35.7 g/dL   RDW 40.1 02.7 - 25.3 %   Platelets 162 150 - 450 x10E3/uL   Neutrophils 61 Not Estab. %   Lymphs 25 Not Estab. %   Monocytes 8 Not Estab. %   Eos 4 Not Estab. %   Basos 1 Not Estab. %   Neutrophils Absolute 4.0 1.4 - 7.0 x10E3/uL   Lymphocytes Absolute 1.6 0.7 - 3.1 x10E3/uL   Monocytes Absolute 0.6 0.1 - 0.9 x10E3/uL   EOS (ABSOLUTE) 0.3 0.0 - 0.4 x10E3/uL   Basophils Absolute 0.1 0.0 - 0.2 x10E3/uL   Immature Granulocytes 1 Not Estab. %   Immature Grans (Abs) 0.1 0.0 - 0.1 x10E3/uL  Testosterone, free, total([facility])     Status: None (Preliminary result)   Collection Time: 04/21/23  8:19 AM  Result Value Ref Range    Testosterone 328 264 - 916 ng/dL    Comment: Adult male reference interval is based on a population of healthy nonobese males (BMI <30) between 68 and 24 years old. Travison, et.al. JCEM [phone]. PMID: [id number].    Testosterone, Free WILL FOLLOW    Sex Hormone Binding 35.3 19.3 - 76.4 nmol/L    Radiology: DG Knee Complete 4 Views Left  Result Date: 12/29/2022 CLINICAL DATA:  Left knee pain after fall. EXAM: LEFT KNEE -  COMPLETE 4+ VIEW COMPARISON:  None Available. FINDINGS: No evidence of fracture, dislocation, or joint effusion. No evidence of arthropathy or other focal bone abnormality. Soft tissues are unremarkable. Patellar enthesophyte. IMPRESSION: No acute fracture or dislocation of the left knee. Electronically Signed   By: Lorenza Cambridge M.D.   On: 12/29/2022 13:05    No results found.  No results found.    Assessment and Plan: Patient Active Problem List   Diagnosis Date Noted   Advance directive discussed with patient 04/17/2023   CPAP use counseling 06/13/2022   Hypertension associated with diabetes (HCC) 06/13/2022   COVID-19 05/25/2021   Encounter for commercial driving license (CDL) exam 16/09/9603   Benign hypertensive renal disease 08/03/2015   Hyperlipemia 08/03/2015   Diabetes mellitus due to underlying condition with diabetic neuropathy (HCC) 08/03/2015   Chronic kidney disease, stage II (mild) 08/03/2015   Male hypogonadism 08/03/2015   Hypothyroidism 08/03/2015   Proteinuria 08/03/2015   ED (erectile dysfunction) 08/03/2015   OSA on CPAP 08/03/2015   Secondary hyperparathyroidism (HCC) 08/03/2015   Diverticulosis 08/03/2015   Closed fracture of left scapula 11/09/2014   Closed fracture of shaft of left clavicle 11/09/2014   Multiple fractures of ribs of left side 11/09/2014   1. OSA on CPAP The patient does tolerate PAP and reports  benefit from PAP use. The patient was reminded how to clean equipment and advised to replace supplies  routinely. The patient was also counselled on weight loss. The compliance is excellent. The AHI is 1.0.   OSA on cpap- controlled. Continue with excellent compliance with pap. CPAP continues to be medically necessary to treat this patient's OSA. F/u one year.    2. CPAP use counseling CPAP Counseling: had a lengthy discussion with the patient regarding the importance of PAP therapy in management of the sleep apnea. Patient appears to understand the risk factor reduction and also understands the risks associated with untreated sleep apnea. Patient will try to make a good faith effort to remain compliant with therapy. Also instructed the patient on proper cleaning of the device including the water must be changed daily if possible and use of distilled water is preferred. Patient understands that the machine should be regularly cleaned with appropriate recommended cleaning solutions that do not damage the PAP machine for example given white vinegar and water rinses. Other methods such as ozone treatment may not be as good as these simple methods to achieve cleaning.   3. Hypertension associated with diabetes (HCC). Discussed today's reading- he has not taken his meds today.  Hypertension Counseling:   The following hypertensive lifestyle modification were recommended and discussed:  1. Limiting alcohol intake to less than 1 oz/day of ethanol:(24 oz of beer or 8 oz of wine or 2 oz of 100-proof whiskey). 2. Take baby ASA 81 mg daily. 3. Importance of regular aerobic exercise and losing weight. 4. Reduce dietary saturated fat and cholesterol intake for overall cardiovascular health. 5. Maintaining adequate dietary potassium, calcium, and magnesium intake. 6. Regular monitoring of the blood pressure. 7. Reduce sodium intake to less than 100 mmol/day (less than 2.3 gm of sodium or less than 6 gm of sodium choride)       General Counseling: I have discussed the findings of the evaluation and examination  with Molli Hazard.  I have also discussed any further diagnostic evaluation thatmay be needed or ordered today. Janson verbalizes understanding of the findings of todays visit. We also reviewed his medications today and discussed drug interactions  and side effects including but not limited excessive drowsiness and altered mental states. We also discussed that there is always a risk not just to him but also people around him. he has been encouraged to call the office with any questions or concerns that should arise related to todays visit.  No orders of the defined types were placed in this encounter.       I have personally obtained a history, examined the patient, evaluated laboratory and imaging results, formulated the assessment and plan and placed orders. This patient was seen today by Emmaline Kluver, PA-C in collaboration with Dr. Freda Munro.   Yevonne Pax, MD [facility] Diplomate ABMS Pulmonary Critical Care Medicine and Sleep Medicine

## 2023-04-25 ENCOUNTER — Ambulatory Visit
Admission: RE | Admit: 2023-04-25 | Discharge: 2023-04-25 | Disposition: A | Discharge status: Home / Self Care | Payer: Medicare Other | Source: Ambulatory Visit | Attending: Family Medicine | Admitting: Family Medicine

## 2023-04-25 DIAGNOSIS — Z0000 Encounter for general adult medical examination without abnormal findings: Secondary | ICD-10-CM | POA: Insufficient documentation

## 2023-04-25 DIAGNOSIS — Z136 Encounter for screening for cardiovascular disorders: Secondary | ICD-10-CM | POA: Diagnosis present

## 2023-04-25 DIAGNOSIS — F17210 Nicotine dependence, cigarettes, uncomplicated: Secondary | ICD-10-CM | POA: Insufficient documentation

## 2023-04-27 LAB — LIPID PANEL W/O CHOL/HDL RATIO
Cholesterol, Total: 262 mg/dL — ABNORMAL HIGH (ref 100–199)
HDL: 40 mg/dL (ref >39)
LDL Chol Calc (NIH): 122 mg/dL — ABNORMAL HIGH (ref 0–99)
Triglycerides: 556 mg/dL (ref 0–149)
VLDL Cholesterol Cal: 100 mg/dL — ABNORMAL HIGH (ref 5–40)

## 2023-04-27 LAB — PTH, INTACT AND CALCIUM: PTH: 20 pg/mL (ref 15–65)

## 2023-04-27 LAB — COMPREHENSIVE METABOLIC PANEL
ALT: 10 IU/L (ref 0–44)
AST: 13 IU/L (ref 0–40)
Albumin/Globulin Ratio: 1.6 (ref 1.2–2.2)
Albumin: 4.1 g/dL (ref 3.9–4.9)
Alkaline Phosphatase: 67 IU/L (ref 44–121)
BUN/Creatinine Ratio: 14 (ref 10–24)
BUN: 15 mg/dL (ref 8–27)
Bilirubin Total: 0.4 mg/dL (ref 0.0–1.2)
CO2: 23 mmol/L (ref 20–29)
Calcium: 9.3 mg/dL (ref 8.6–10.2)
Chloride: 103 mmol/L (ref 96–106)
Creatinine, Ser: 1.06 mg/dL (ref 0.76–1.27)
Globulin, Total: 2.6 g/dL (ref 1.5–4.5)
Glucose: 222 mg/dL — ABNORMAL HIGH (ref 70–99)
Potassium: 3.6 mmol/L (ref 3.5–5.2)
Sodium: 142 mmol/L (ref 134–144)
Total Protein: 6.7 g/dL (ref 6.0–8.5)
eGFR: 78 mL/min/{1.73_m2} (ref >59)

## 2023-04-27 LAB — TESTOSTERONE, FREE, TOTAL, SHBG
Sex Hormone Binding: 35.3 nmol/L (ref 19.3–76.4)
Testosterone, Free: 5.6 pg/mL — ABNORMAL LOW (ref 6.6–18.1)
Testosterone: 328 ng/dL (ref 264–916)

## 2023-04-27 LAB — CBC WITH DIFFERENTIAL/PLATELET
Basophils Absolute: 0.1 10*3/uL (ref 0.0–0.2)
Basos: 1 %
EOS (ABSOLUTE): 0.3 10*3/uL (ref 0.0–0.4)
Eos: 4 %
Hematocrit: 44.9 % (ref 37.5–51.0)
Hemoglobin: 14.9 g/dL (ref 13.0–17.7)
Immature Grans (Abs): 0.1 10*3/uL (ref 0.0–0.1)
Immature Granulocytes: 1 %
Lymphocytes Absolute: 1.6 10*3/uL (ref 0.7–3.1)
Lymphs: 25 %
MCH: 29.5 pg (ref 26.6–33.0)
MCHC: 33.2 g/dL (ref 31.5–35.7)
MCV: 89 fL (ref 79–97)
Monocytes Absolute: 0.6 10*3/uL (ref 0.1–0.9)
Monocytes: 8 %
Neutrophils Absolute: 4 10*3/uL (ref 1.4–7.0)
Neutrophils: 61 %
Platelets: 162 10*3/uL (ref 150–450)
RBC: 5.05 x10E6/uL (ref 4.14–5.80)
RDW: 13.4 % (ref 11.6–15.4)
WBC: 6.5 10*3/uL (ref 3.4–10.8)

## 2023-04-27 LAB — PSA: Prostate Specific Ag, Serum: 2.4 ng/mL (ref 0.0–4.0)

## 2023-04-27 LAB — TSH: TSH: 3.73 u[IU]/mL (ref 0.450–4.500)

## 2023-05-08 NOTE — Progress Notes (Signed)
Interpreted by me 04/17/23. NSR at 64bpm. Flipped t-waves in V4-6.

## 2023-05-30 ENCOUNTER — Other Ambulatory Visit: Payer: Self-pay | Admitting: Family Medicine

## 2023-05-31 NOTE — Telephone Encounter (Signed)
Requested Prescriptions  Pending Prescriptions Disp Refills   glipiZIDE (GLUCOTROL) 5 MG tablet [Pharmacy Med Name: GLIPIZIDE 5MG  TABLETS] 180 tablet 0    Sig: TAKE 1 TABLET(5 MG) BY MOUTH TWICE DAILY BEFORE A MEAL     Endocrinology:  Diabetes - Sulfonylureas Failed - 05/30/2023  7:00 AM      Failed - HBA1C is between 0 and 7.9 and within 180 days    Hemoglobin A1C  Date Value Ref Range Status  07/09/2018 6.9  Final   HB A1C (BAYER DCA - WAIVED)  Date Value Ref Range Status  04/21/2023 9.3 (H) 4.8 - 5.6 % Final    Comment:             Prediabetes: 5.7 - 6.4          Diabetes: >6.4          Glycemic control for adults with diabetes: <7.0          Passed - Cr in normal range and within 360 days    Creatinine  Date Value Ref Range Status  11/08/2014 1.31 (H) 0.60 - 1.30 mg/dL Final   Creatinine, Ser  Date Value Ref Range Status  04/21/2023 1.06 0.76 - 1.27 mg/dL Final         Passed - Valid encounter within last 6 months    Recent Outpatient Visits           1 month ago Welcome to [employer] preventive visit   [place] [facility] [place], Megan P, DO   2 months ago Diabetes mellitus due to underlying condition with diabetic neuropathy, without long-term current use of insulin (HCC)   [place] [facility] [place], Megan P, DO   5 months ago Routine general medical examination at a health care facility   [facility], [place] P, DO   8 months ago Diabetes mellitus due to underlying condition with diabetic neuropathy, without long-term current use of insulin (HCC)   [place] [facility] [place], Megan P, DO   11 months ago Diabetes mellitus due to underlying condition with diabetic neuropathy, without long-term current use of insulin [place])   [place] [facility] [place], Oralia Rud, DO       Future Appointments             In 1 month Johnson, Oralia Rud, DO [facility]

## 2023-07-24 ENCOUNTER — Ambulatory Visit (INDEPENDENT_AMBULATORY_CARE_PROVIDER_SITE_OTHER): Payer: Medicare Other | Admitting: Family Medicine

## 2023-07-24 ENCOUNTER — Encounter: Payer: Self-pay | Admitting: Family Medicine

## 2023-07-24 ENCOUNTER — Telehealth: Payer: Self-pay

## 2023-07-24 VITALS — BP 156/82 | HR 65 | Temp 98.0°F | Wt 231.4 lb

## 2023-07-24 DIAGNOSIS — E0840 Diabetes mellitus due to underlying condition with diabetic neuropathy, unspecified: Secondary | ICD-10-CM

## 2023-07-24 DIAGNOSIS — Z7984 Long term (current) use of oral hypoglycemic drugs: Secondary | ICD-10-CM | POA: Diagnosis not present

## 2023-07-24 DIAGNOSIS — E1159 Type 2 diabetes mellitus with other circulatory complications: Secondary | ICD-10-CM

## 2023-07-24 DIAGNOSIS — E1140 Type 2 diabetes mellitus with diabetic neuropathy, unspecified: Secondary | ICD-10-CM | POA: Diagnosis not present

## 2023-07-24 DIAGNOSIS — I152 Hypertension secondary to endocrine disorders: Secondary | ICD-10-CM

## 2023-07-24 LAB — BAYER DCA HB A1C WAIVED: HB A1C (BAYER DCA - WAIVED): 10 % — ABNORMAL HIGH (ref 4.8–5.6)

## 2023-07-24 MED ORDER — AMLODIPINE BESYLATE 10 MG PO TABS: 10.0000 mg | ORAL_TABLET | Freq: Every day | ORAL | 1 refills | Status: DC

## 2023-07-24 MED ORDER — PIOGLITAZONE HCL 30 MG PO TABS: 30.0000 mg | ORAL_TABLET | Freq: Every day | ORAL | 1 refills | Status: DC

## 2023-07-24 MED ORDER — METFORMIN HCL ER 500 MG PO TB24: ORAL_TABLET | ORAL | 1 refills | Status: DC

## 2023-07-24 MED ORDER — EMPAGLIFLOZIN 25 MG PO TABS: 25.0000 mg | ORAL_TABLET | Freq: Every day | ORAL | 1 refills | Status: DC

## 2023-07-24 MED ORDER — ROSUVASTATIN CALCIUM 10 MG PO TABS: ORAL_TABLET | ORAL | 1 refills | Status: DC

## 2023-07-24 MED ORDER — GLIPIZIDE 5 MG PO TABS: 5.0000 mg | ORAL_TABLET | Freq: Two times a day (BID) | ORAL | 1 refills | Status: DC

## 2023-07-24 MED ORDER — TESTOSTERONE CYPIONATE 200 MG/ML IM SOLN: INTRAMUSCULAR | 5 refills | Status: DC

## 2023-07-24 MED ORDER — TIRZEPATIDE 2.5 MG/0.5ML ~~LOC~~ SOAJ: 2.5000 mg | SUBCUTANEOUS | 0 refills | Status: DC

## 2023-07-24 MED ORDER — TIRZEPATIDE 7.5 MG/0.5ML ~~LOC~~ SOAJ: 7.5000 mg | SUBCUTANEOUS | 1 refills | Status: DC

## 2023-07-24 MED ORDER — LOSARTAN POTASSIUM 100 MG PO TABS: 100.0000 mg | ORAL_TABLET | Freq: Every day | ORAL | 1 refills | Status: DC

## 2023-07-24 MED ORDER — FENOFIBRATE 160 MG PO TABS: 160.0000 mg | ORAL_TABLET | Freq: Every day | ORAL | 1 refills | Status: DC

## 2023-07-24 MED ORDER — HYDROCHLOROTHIAZIDE 25 MG PO TABS: 25.0000 mg | ORAL_TABLET | Freq: Every day | ORAL | 1 refills | Status: DC

## 2023-07-24 MED ORDER — TIRZEPATIDE 5 MG/0.5ML ~~LOC~~ SOAJ: 5.0000 mg | SUBCUTANEOUS | 0 refills | Status: DC

## 2023-07-24 NOTE — Assessment & Plan Note (Signed)
Running high. Drinking a lot of caffeine. Will cut down. Recheck in 2 months.

## 2023-07-24 NOTE — Progress Notes (Signed)
   07/24/2023  Patient ID: Manuel Burnett, male   DOB: 1956-09-13, 66 y.o.   MRN: [id number]  Patient outreach attempt in response to clinic routed request from patient's PCP, Dr. Laural Benes.  Mr. Mcquaide has not been taking Jardiance due to cost of the medication.  He and I spoke previously, but it appears he now has Medicare and may qualify for PAP for either this or [place].  I was not able to reach the patient but did leave a voicemail with my direct phone number.  If I do not hear back from him, I will try to call again in a few days.  Lenna Gilford, PharmD, DPLA

## 2023-07-24 NOTE — Assessment & Plan Note (Signed)
Has been off his jardiance due to cost. Should be more reasonable now. Refills given will restart. A1c not under good control at 10. Will try mounjaro, has had rashes with other GLP-1s before so will watch closely. Call with any concerns. Recheck about 2 months.

## 2023-07-24 NOTE — Progress Notes (Signed)
BP (!) 156/82   Pulse 65   Temp 98 F (36.7 C) (Oral)   Wt 231 lb 6.4 oz (105 kg)   SpO2 96%   BMI 30.53 kg/m    Subjective:    Patient ID: Manuel Burnett, male    DOB: 1956/01/18, 67 y.o.   MRN: [id number]  HPI: Manuel Burnett is a 67 y.o. male  Chief Complaint  Patient presents with   Diabetes   DIABETES- has been off his jardiance for probably 3 months due to cost Hypoglycemic episodes:no Polydipsia/polyuria: no Visual disturbance: no Chest pain: no Paresthesias: no Glucose Monitoring: yes  Accucheck frequency: Daily Taking Insulin?: no Blood Pressure Monitoring: not checking Retinal Examination: Up to Date Foot Exam: Up to Date Diabetic Education: Completed Pneumovax: Up to Date Influenza: Not up to Date Aspirin: no   Relevant past medical, surgical, family and social history reviewed and updated as indicated. Interim medical history since our last visit reviewed. Allergies and medications reviewed and updated.  Review of Systems  Constitutional: Negative.   Respiratory: Negative.    Cardiovascular: Negative.   Gastrointestinal: Negative.   Musculoskeletal: Negative.   Neurological: Negative.   Psychiatric/Behavioral: Negative.      Per HPI unless specifically indicated above     Objective:    BP (!) 156/82   Pulse 65   Temp 98 F (36.7 C) (Oral)   Wt 231 lb 6.4 oz (105 kg)   SpO2 96%   BMI 30.53 kg/m   Wt Readings from Last 3 Encounters:  07/24/23 231 lb 6.4 oz (105 kg)  04/24/23 228 lb (103.4 kg)  04/17/23 233 lb 1.6 oz (105.7 kg)    Physical Exam Vitals and nursing note reviewed.  Constitutional:      General: He is not in acute distress.    Appearance: Normal appearance. He is not ill-appearing, toxic-appearing or diaphoretic.  HENT:     Head: Normocephalic and atraumatic.     Right Ear: External ear normal.     Left Ear: External ear normal.     Nose: Nose normal.     Mouth/Throat:     Mouth: Mucous membranes are moist.      Pharynx: Oropharynx is clear.  Eyes:     General: No scleral icterus.       Right eye: No discharge.        Left eye: No discharge.     Extraocular Movements: Extraocular movements intact.     Conjunctiva/sclera: Conjunctivae normal.     Pupils: Pupils are equal, round, and reactive to light.  Cardiovascular:     Rate and Rhythm: Normal rate and regular rhythm.     Pulses: Normal pulses.     Heart sounds: Normal heart sounds. No murmur heard.    No friction rub. No gallop.  Pulmonary:     Effort: Pulmonary effort is normal. No respiratory distress.     Breath sounds: Normal breath sounds. No stridor. No wheezing, rhonchi or rales.  Chest:     Chest wall: No tenderness.  Musculoskeletal:        General: Normal range of motion.     Cervical back: Normal range of motion and neck supple.  Skin:    General: Skin is warm and dry.     Capillary Refill: Capillary refill takes less than 2 seconds.     Coloration: Skin is not jaundiced or pale.     Findings: No bruising, erythema, lesion or rash.  Neurological:  General: No focal deficit present.     Mental Status: He is alert and oriented to person, place, and time. Mental status is at baseline.  Psychiatric:        Mood and Affect: Mood normal.        Behavior: Behavior normal.        Thought Content: Thought content normal.        Judgment: Judgment normal.     Results for orders placed or performed in visit on 07/24/23  Bayer DCA Hb A1c Waived  Result Value Ref Range   HB A1C (BAYER DCA - WAIVED) 10.0 (H) 4.8 - 5.6 %      Assessment & Plan:   Problem List Items Addressed This Visit       Cardiovascular and Mediastinum   Hypertension associated with diabetes (HCC)    Running high. Drinking a lot of caffeine. Will cut down. Recheck in 2 months.       Relevant Medications   amLODipine (NORVASC) 10 MG tablet   empagliflozin (JARDIANCE) 25 MG TABS tablet   fenofibrate 160 MG tablet   glipiZIDE (GLUCOTROL) 5 MG  tablet   hydrochlorothiazide (HYDRODIURIL) 25 MG tablet   losartan (COZAAR) 100 MG tablet   metFORMIN (GLUCOPHAGE-XR) 500 MG 24 hr tablet   pioglitazone (ACTOS) 30 MG tablet   rosuvastatin (CRESTOR) 10 MG tablet   tirzepatide (MOUNJARO) 2.5 MG/0.5ML Pen   tirzepatide [facility]) 5 MG/0.5ML Pen (Start on 08/24/2023)   tirzepatide [facility]) 7.5 MG/0.5ML Pen (Start on 09/23/2023)     Endocrine   Diabetes mellitus due to underlying condition with diabetic neuropathy (HCC) - Primary    Has been off his jardiance due to cost. Should be more reasonable now. Refills given will restart. A1c not under good control at 10. Will try mounjaro, has had rashes with other GLP-1s before so will watch closely. Call with any concerns. Recheck about 2 months.       Relevant Medications   empagliflozin (JARDIANCE) 25 MG TABS tablet   glipiZIDE (GLUCOTROL) 5 MG tablet   losartan (COZAAR) 100 MG tablet   metFORMIN (GLUCOPHAGE-XR) 500 MG 24 hr tablet   pioglitazone (ACTOS) 30 MG tablet   rosuvastatin (CRESTOR) 10 MG tablet   tirzepatide (MOUNJARO) 2.5 MG/0.5ML Pen   tirzepatide Greggory Keen) 5 MG/0.5ML Pen (Start on 08/24/2023)   tirzepatide Manuel Burnett) 7.5 MG/0.5ML Pen (Start on 09/23/2023)   Other Relevant Orders   Bayer DCA Hb A1c Waived (Completed)     Follow up plan: Return in about 8 weeks (around 09/18/2023) for before the end of September.

## 2023-08-01 ENCOUNTER — Telehealth: Payer: Self-pay

## 2023-08-01 ENCOUNTER — Other Ambulatory Visit: Payer: Self-pay | Admitting: Family Medicine

## 2023-08-01 NOTE — Telephone Encounter (Signed)
Medication Refill - Medication: Levothyroxzine   Has the patient contacted their pharmacy? Yes.   (Agent: If no, request that the patient contact the pharmacy for the refill. If patient does not wish to contact the pharmacy document the reason why and proceed with request.) (Agent: If yes, when and what did the pharmacy advise?)  Preferred Pharmacy (with phone number or street name): [facility] [name] Has the patient been seen for an appointment in the last year OR does the patient have an upcoming appointment? Yes.    Agent: Please be advised that RX refills may take up to 3 business days. We ask that you follow-up with your pharmacy.

## 2023-08-01 NOTE — Progress Notes (Signed)
08/01/2023 Name: Manuel Burnett MRN: [id number] DOB: [birth year]  Chief Complaint  Patient presents with   Medication Assistance   Manuel Burnett is a 67 y.o. year old male who presented for a telephone visit.   They were referred to the pharmacist by their PCP for assistance in managing medication access.   Subjective:  Care Team: Primary Care Provider: Dorcas Carrow, DO ; Next Scheduled Visit: 9/24  Medication Access/Adherence Current Pharmacy:  [id number] - [name], [place] - [address] AT [facility] [address] [place] [address] Phone: [phone] Fax: [phone]  [id number] [place], [place] - [address] [place] [address] Phone: [phone] Fax: [phone]  [facility] [address] [place] [address] Phone: [phone] Fax: [phone]  -Patient reports affordability concerns with their medications: Yes -Jardiance -Patient reports access/transportation concerns to their pharmacy: No  -Patient reports adherence concerns with their medications:  Yes  Has been without SGLT2 and GLP1 for quite sometime now  Diabetes: Current medications: glipizide 5mg  BID before meals, metformin 1000mg  BID, pioglitazone 30mg  dialy, Mounjaro 2.5mg  weekly -Mounjaro 2.5mg  weekly is going through on insurance for $11, and patient pans to pick up and start therapy this week -Patient is also prescribed Jardiance 25mg  daily, but his copay for this medication is going to be $400, which he is not able to afford -Recent A1c on 8/5 was 10, which is up from 9.3 in May -Patient endorses being without SGLT2 and GLP1 therapy prior to recent A1c -He has previously tried [employer] and Trulicity, and these both caused skin irritation he was not able to tolerate -Patient denies hypoglycemic s/sx including dizziness, shakiness, sweating.  -Patient denies  hyperglycemic symptoms including polyuria, polydipsia, polyphagia, nocturia, neuropathy, blurred vision.  Objective: Lab Results  Component Value Date   HGBA1C 10.0 (H) 07/24/2023   Medications Reviewed Today     Reviewed by Lenna Gilford, [facility] (Pharmacist) on 08/01/23 at 1252  Med List Status: <None>   Medication Order Taking? Sig Documenting Provider Last Dose Status Informant  albuterol (PROAIR HFA) 108 (90 Base) MCG/ACT inhaler [id number] Yes Inhale 2 puffs into the lungs every 4 (four) hours as needed for wheezing or shortness of breath. Johnson, Megan P, DO Taking Active   amLODipine (NORVASC) 10 MG tablet [id number] Yes Take 1 tablet (10 mg total) by mouth daily. Johnson, Megan P, DO Taking Active   B-D INTEGRA SYRINGE 22G X 1-1/2" 3 ML MISC [id number] Yes Inject testosterone 1x every 2 weeks Dorcas Carrow, DO Taking Active   BD HYPODERMIC NEEDLE 18G X 1" MISC [id number] Yes INJECT TESTOSTERONE EVERY 2 WEEKS AS DIRECTED Johnson, Megan P, DO Taking Active   clobetasol (TEMOVATE) 0.05 % external solution [id number] No Apply topically. [provider] Unknown Active   clobetasol cream (TEMOVATE) 0.05 % [id number] No SMARTSIG:2 Topical Twice Daily [provider] Unknown Active   empagliflozin (JARDIANCE) 25 MG TABS tablet [id number] No Take 1 tablet (25 mg total) by mouth daily before breakfast.  Patient not taking: Reported on 08/01/2023   Dorcas Carrow, DO Not Taking Active   fenofibrate 160 MG tablet [id number] Yes Take 1 tablet (160 mg total) by mouth daily. with food Olevia Perches P, DO Taking Active   glipiZIDE (GLUCOTROL) 5 MG tablet [id number] Yes Take 1 tablet (5 mg total) by mouth 2 (two) times  daily before a meal. Olevia Perches P, DO Taking Active   glucose blood (ACCU-CHEK GUIDE) test strip [id number] Yes Use as instructed Olevia Perches P, DO Taking Active   hydrochlorothiazide (HYDRODIURIL) 25 MG tablet [id number] Yes Take 1 tablet (25 mg total) by mouth  daily. Olevia Perches P, DO Taking Active   ketoconazole (NIZORAL) 2 % shampoo [id number] No SMARTSIG:Topical 2-3 Times Weekly [provider] Unknown Active   levothyroxine (SYNTHROID) 200 MCG tablet [id number] Yes Take 1 tablet (200 mcg total) by mouth daily before breakfast. To be taken with 75mg  for total. Olevia Perches P, DO Taking Active   levothyroxine (SYNTHROID) 75 MCG tablet [id number] Yes Take 1 tablet (75 mcg total) by mouth daily. To be taken with 200mg  for total. Olevia Perches P, DO Taking Active   losartan (COZAAR) 100 MG tablet [id number] Yes Take 1 tablet (100 mg total) by mouth daily. Johnson, Megan P, DO Taking Active   metFORMIN (GLUCOPHAGE-XR) 500 MG 24 hr tablet [id number] Yes TAKE 2 TABLETS(1000 MG) BY MOUTH IN THE MORNING AND AT BEDTIME Johnson, Megan P, DO Taking Active   Multiple Vitamin (MULTIVITAMIN WITH MINERALS) TABS tablet [id number] Yes Take 1 tablet by mouth daily. [provider] Taking Active Self  pioglitazone (ACTOS) 30 MG tablet [id number] Yes Take 1 tablet (30 mg total) by mouth daily. Olevia Perches P, DO Taking Active   rosuvastatin (CRESTOR) 10 MG tablet [id number] Yes TAKE 1 TABLET(10 MG) BY MOUTH DAILY Laural Benes, Megan P, DO Taking Active   sildenafil (VIAGRA) 100 MG tablet [id number] Yes Take 1 tablet (100 mg total) by mouth daily as needed for erectile dysfunction. Olevia Perches P, DO Taking Active   testosterone cypionate (DEPOTESTOSTERONE CYPIONATE) 200 MG/ML injection [id number] Yes ADMINISTER 0.5 ML(100 MG) IN THE MUSCLE EVERY 14 DAYS Johnson, Megan P, DO Taking Active   tirzepatide [facility]) 2.5 MG/0.5ML Pen [id number] Yes Inject 2.5 mg into the skin once a week. Olevia Perches P, DO Taking Active   tirzepatide [facility]) 5 MG/0.5ML Pen [id number] No Inject 5 mg into the skin once a week.  Patient not taking: Reported on 08/01/2023   Dorcas Carrow, DO Not Taking Active   tirzepatide [facility]) 7.5 MG/0.5ML Pen [id number] No  Inject 7.5 mg into the skin once a week.  Patient not taking: Reported on 08/01/2023   Dorcas Carrow, DO Not Taking Active            Assessment/Plan:   Diabetes: - Currently uncontrolled - Contacted pharmacy to verify Jardiance being processed on insurance correctly, and it was for the $400 copay.  They have placed this on hold for now. - Discussed [employer] Patient Assistance Program for Jardiance 25mg , but patient does not qualify. - Recommend patient begin Mounjaro 2.5mg  weekly and titrate every 4 weeks as tolerated - Recommend f/u A1c in 3 months to see efficacy of GLP1 - If patient cannot tolerate Mounjaro, could increase pioglitazone to 45mg  daily or increase sulfonylurea dosing  Follow Up Plan: None scheduled at this time but can as needed per PCP  Lenna Gilford, PharmD, DPLA

## 2023-08-03 MED ORDER — LEVOTHYROXINE SODIUM 75 MCG PO TABS: 75.0000 ug | ORAL_TABLET | Freq: Every day | ORAL | 1 refills | Status: DC

## 2023-08-03 NOTE — Telephone Encounter (Signed)
Requested Prescriptions  Pending Prescriptions Disp Refills   levothyroxine (SYNTHROID) 75 MCG tablet 90 tablet 1    Sig: Take 1 tablet (75 mcg total) by mouth daily. To be taken with 200mg  for total.     Endocrinology:  Hypothyroid Agents Passed - 08/01/2023  3:06 PM      Passed - TSH in normal range and within 360 days    TSH  Date Value Ref Range Status  04/21/2023 3.730 0.450 - 4.500 uIU/mL Final         Passed - Valid encounter within last 12 months    Recent Outpatient Visits           1 week ago Diabetes mellitus due to underlying condition with diabetic neuropathy, without long-term current use of insulin (HCC)   [place] [facility] [place], Megan P, DO   3 months ago [facility] [employer] preventive visit   [place] [facility] [place], Megan P, DO   5 months ago Diabetes mellitus due to underlying condition with diabetic neuropathy, without long-term current use of insulin (HCC)   [place] [facility] [place], Megan P, DO   8 months ago Routine general medical examination at a health care facility   [facility], [place] P, DO   11 months ago Diabetes mellitus due to underlying condition with diabetic neuropathy, without long-term current use of insulin [facility])   [facility] [place], Oralia Rud, DO       Future Appointments             In 1 month Johnson, Oralia Rud, DO [place] [facility]

## 2023-08-09 ENCOUNTER — Encounter: Payer: Self-pay | Admitting: Emergency Medicine

## 2023-09-12 ENCOUNTER — Ambulatory Visit: Payer: Medicare Other | Admitting: Family Medicine

## 2023-09-25 ENCOUNTER — Ambulatory Visit: Payer: Medicare Other | Admitting: Family Medicine

## 2023-10-25 ENCOUNTER — Ambulatory Visit: Payer: Medicare Other | Admitting: Family Medicine

## 2023-11-01 ENCOUNTER — Other Ambulatory Visit: Payer: Self-pay

## 2023-11-01 ENCOUNTER — Encounter: Payer: Self-pay | Admitting: Emergency Medicine

## 2023-11-01 ENCOUNTER — Emergency Department: Payer: Medicare Other

## 2023-11-01 ENCOUNTER — Emergency Department
Admission: EM | Admit: 2023-11-01 | Discharge: 2023-11-01 | Disposition: A | Discharge status: Home / Self Care | Payer: Medicare Other | Attending: Emergency Medicine | Admitting: Emergency Medicine

## 2023-11-01 DIAGNOSIS — I129 Hypertensive chronic kidney disease with stage 1 through stage 4 chronic kidney disease, or unspecified chronic kidney disease: Secondary | ICD-10-CM | POA: Insufficient documentation

## 2023-11-01 DIAGNOSIS — W19XXXA Unspecified fall, initial encounter: Secondary | ICD-10-CM | POA: Diagnosis not present

## 2023-11-01 DIAGNOSIS — N189 Chronic kidney disease, unspecified: Secondary | ICD-10-CM | POA: Insufficient documentation

## 2023-11-01 DIAGNOSIS — S299XXA Unspecified injury of thorax, initial encounter: Secondary | ICD-10-CM | POA: Diagnosis present

## 2023-11-01 DIAGNOSIS — S20212A Contusion of left front wall of thorax, initial encounter: Secondary | ICD-10-CM | POA: Diagnosis not present

## 2023-11-01 DIAGNOSIS — E1122 Type 2 diabetes mellitus with diabetic chronic kidney disease: Secondary | ICD-10-CM | POA: Diagnosis not present

## 2023-11-01 MED ORDER — CYCLOBENZAPRINE HCL 10 MG PO TABS
10.0000 mg | ORAL_TABLET | Freq: Once | ORAL | Status: AC
  Administered 2023-11-01: 10 mg via ORAL
  Filled 2023-11-01: qty 1

## 2023-11-01 MED ORDER — CYCLOBENZAPRINE HCL 5 MG PO TABS: 5.0000 mg | ORAL_TABLET | Freq: Three times a day (TID) | ORAL | 0 refills | Status: DC | PRN

## 2023-11-01 MED ORDER — LIDOCAINE 5 % EX PTCH: 1.0000 | MEDICATED_PATCH | Freq: Two times a day (BID) | CUTANEOUS | 0 refills | Status: AC | PRN

## 2023-11-01 MED ORDER — LIDOCAINE 5 % EX PTCH
1.0000 | MEDICATED_PATCH | Freq: Once | CUTANEOUS | Status: DC
  Administered 2023-11-01: 1 via TRANSDERMAL
  Filled 2023-11-01: qty 1

## 2023-11-01 NOTE — Discharge Instructions (Addendum)
Your exam and XR did not show any signs of an acute fracture. Take the prescription meds as directed. Apply moist heat as needed.

## 2023-11-01 NOTE — ED Triage Notes (Signed)
Patient to ED via POV for left rib pain. Pt reports falling last week and having pain since. Denies hitting head or LOC. Hx of hypertension. Took BP meds at 1740.

## 2023-11-01 NOTE — ED Provider Notes (Signed)
[facility] Emergency Department Provider Note     Event Date/Time   First MD Initiated Contact with Patient 11/01/23 2105     (approximate)   History   Rib Injury   HPI  Manuel Burnett is a 67 y.o. male a history of HLD, diabetes, CKD, hypertension, presents to the ED for evaluation of left-sided chest wall and rib pain.  Patient would endorse a fall last week, and has been having increasing pain since that time.  Denies any head injury or LOC.  No cough, congestion,or hemoptysis reported.  Physical Exam   Triage Vital Signs: ED Triage Vitals  Encounter Vitals Group     BP 11/01/23 1802 (!) 207/94     Systolic BP Percentile --      Diastolic BP Percentile --      Pulse Rate 11/01/23 1802 73     Resp 11/01/23 1802 18     Temp 11/01/23 1802 98.1 F (36.7 C)     Temp Source 11/01/23 1802 Oral     SpO2 11/01/23 1802 95 %     Weight 11/01/23 1803 220 lb (99.8 kg)     Height 11/01/23 1803 6\' 1"  (1.854 m)     Head Circumference --      Peak Flow --      Pain Score 11/01/23 1803 9     Pain Loc --      Pain Education --      Exclude from Growth Chart --     Most recent vital signs: Vitals:   11/01/23 2057 11/01/23 2229  BP: (!) 175/84 (!) 162/97  Pulse: 65 66  Resp: 17 16  Temp: 98.1 F (36.7 C)   SpO2: 96% 93%    General Awake, no distress. NAD HEENT NCAT. PERRL. EOMI. No rhinorrhea. Mucous membranes are moist.  CV:  Good peripheral perfusion. RRR RESP:  Normal effort. CTA.  No chest wall deformity, ecchymosis, or abnormalities noted. ABD:  No distention.  Soft and nontender MSK:  Active range of motion of all extremities.  ED Results / Procedures / Treatments   Labs (all labs ordered are listed, but only abnormal results are displayed) Labs Reviewed - No data to display   EKG   RADIOLOGY  I personally viewed and evaluated these images as part of my medical decision making, as well as reviewing the written report by the  radiologist.  ED Provider Interpretation: No acute findings  DG Ribs Unilateral W/Chest Left  Result Date: 11/01/2023 CLINICAL DATA:  Larey Seat last week with subsequent pain. EXAM: LEFT RIBS AND CHEST - 3+ VIEW COMPARISON:  02/06/2006 FINDINGS: Stable cardiomediastinal silhouette. Bibasilar atelectasis. Otherwise no focal consolidation, pleural effusion, or pneumothorax. Remote fracture left scapula. Plate and screw fixation left clavicle. Remote left 3rd-6th rib fractures. The area of symptomatic concern as indicated by the patient was denoted with a metallic skin BB by the technologist. No acute fracture. IMPRESSION: No evidence of acute fracture. Electronically Signed   By: Minerva Fester M.D.   On: 11/01/2023 20:08     PROCEDURES:  Critical Care performed: No  Procedures   MEDICATIONS ORDERED IN ED: Medications  lidocaine (LIDODERM) 5 % 1 patch (1 patch Transdermal Patch Applied 11/01/23 2227)  cyclobenzaprine (FLEXERIL) tablet 10 mg (10 mg Oral Given 11/01/23 2227)     IMPRESSION / MDM / ASSESSMENT AND PLAN / ED COURSE  I reviewed the triage vital signs and the nursing notes.  Differential diagnosis includes, but is not limited to, fracture, pneumothorax, chest wall contusion, myalgias  Patient's presentation is most consistent with acute complicated illness / injury requiring diagnostic workup.  Patient's diagnosis is consistent with chest wall contusion without evidence of fracture.  Patient with a reassuring exam and workup at this time.  No abnormal findings on exam.  No x-ray evidence of any acute fracture based on my interpretation.  Patient will be discharged home with prescriptions for LidoDerm patches and Flexeril. Patient is to follow up with his PCP as discussed, as needed or otherwise directed. Patient is given ED precautions to return to the ED for any worsening or new symptoms.   FINAL CLINICAL IMPRESSION(S) / ED DIAGNOSES   Final  diagnoses:  Rib contusion, left, initial encounter     Rx / DC Orders   ED Discharge Orders          Ordered    cyclobenzaprine (FLEXERIL) 5 MG tablet  3 times daily PRN        11/01/23 2222    lidocaine (LIDODERM) 5 %  Every 12 hours PRN        11/01/23 2222             Note:  This document was prepared using Dragon voice recognition software and may include unintentional dictation errors.    Lissa Hoard, PA-C 11/01/23 2241    Phineas Semen, MD 11/01/23 [phone]

## 2023-11-09 ENCOUNTER — Ambulatory Visit (INDEPENDENT_AMBULATORY_CARE_PROVIDER_SITE_OTHER): Payer: Medicare Other | Admitting: Family Medicine

## 2023-11-09 VITALS — BP 162/84 | HR 61 | Temp 97.9°F | Resp 16 | Wt 227.4 lb

## 2023-11-09 DIAGNOSIS — E0840 Diabetes mellitus due to underlying condition with diabetic neuropathy, unspecified: Secondary | ICD-10-CM

## 2023-11-09 DIAGNOSIS — I129 Hypertensive chronic kidney disease with stage 1 through stage 4 chronic kidney disease, or unspecified chronic kidney disease: Secondary | ICD-10-CM

## 2023-11-09 DIAGNOSIS — E039 Hypothyroidism, unspecified: Secondary | ICD-10-CM

## 2023-11-09 DIAGNOSIS — E782 Mixed hyperlipidemia: Secondary | ICD-10-CM | POA: Diagnosis not present

## 2023-11-09 LAB — BAYER DCA HB A1C WAIVED: HB A1C (BAYER DCA - WAIVED): 9.1 % — ABNORMAL HIGH (ref 4.8–5.6)

## 2023-11-09 MED ORDER — INSULIN DEGLUDEC 100 UNIT/ML ~~LOC~~ SOPN: 20.0000 [IU] | PEN_INJECTOR | Freq: Every day | SUBCUTANEOUS | 1 refills | Status: DC

## 2023-11-09 NOTE — Assessment & Plan Note (Signed)
Under good control on current regimen. Continue current regimen. Continue to monitor. Call with any concerns. Refills given. Labs drawn today.   
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
## 2023-11-09 NOTE — Assessment & Plan Note (Signed)
Rechecking labs today. Await results. Treat as needed.  °

## 2023-11-09 NOTE — Assessment & Plan Note (Signed)
Running high. Will watch DASH diet and recheck in 1 month- may need to add hydralazine.

## 2023-11-09 NOTE — Assessment & Plan Note (Signed)
Improved with A1c down to 9.1 from 10.0, but not under good control. Will start tresiba 20units and recheck virtually in 1 week to titrate medication. Monitor sugars every AM.

## 2023-11-10 LAB — CBC WITH DIFFERENTIAL/PLATELET
Basophils Absolute: 0.1 10*3/uL (ref 0.0–0.2)
Basos: 1 %
EOS (ABSOLUTE): 0.2 10*3/uL (ref 0.0–0.4)
Eos: 3 %
Hematocrit: 46.6 % (ref 37.5–51.0)
Hemoglobin: 15.4 g/dL (ref 13.0–17.7)
Immature Grans (Abs): 0 10*3/uL (ref 0.0–0.1)
Immature Granulocytes: 0 %
Lymphocytes Absolute: 1.6 10*3/uL (ref 0.7–3.1)
Lymphs: 23 %
MCH: 29.6 pg (ref 26.6–33.0)
MCHC: 33 g/dL (ref 31.5–35.7)
MCV: 89 fL (ref 79–97)
Monocytes Absolute: 0.6 10*3/uL (ref 0.1–0.9)
Monocytes: 9 %
Neutrophils Absolute: 4.5 10*3/uL (ref 1.4–7.0)
Neutrophils: 64 %
Platelets: 199 10*3/uL (ref 150–450)
RBC: 5.21 x10E6/uL (ref 4.14–5.80)
RDW: 13.1 % (ref 11.6–15.4)
WBC: 7 10*3/uL (ref 3.4–10.8)

## 2023-11-10 LAB — TSH: TSH: 6.3 u[IU]/mL — ABNORMAL HIGH (ref 0.450–4.500)

## 2023-11-10 LAB — LIPID PANEL W/O CHOL/HDL RATIO
Cholesterol, Total: 336 mg/dL — ABNORMAL HIGH (ref 100–199)
HDL: 36 mg/dL — ABNORMAL LOW (ref >39)
Triglycerides: 804 mg/dL (ref 0–149)

## 2023-11-10 LAB — COMPREHENSIVE METABOLIC PANEL
ALT: 10 [IU]/L (ref 0–44)
AST: 14 [IU]/L (ref 0–40)
Albumin: 4.4 g/dL (ref 3.9–4.9)
Alkaline Phosphatase: 90 [IU]/L (ref 44–121)
BUN/Creatinine Ratio: 13 (ref 10–24)
BUN: 15 mg/dL (ref 8–27)
Bilirubin Total: 0.4 mg/dL (ref 0.0–1.2)
CO2: 22 mmol/L (ref 20–29)
Calcium: 9.1 mg/dL (ref 8.6–10.2)
Chloride: 97 mmol/L (ref 96–106)
Creatinine, Ser: 1.12 mg/dL (ref 0.76–1.27)
Globulin, Total: 2.4 g/dL (ref 1.5–4.5)
Glucose: 194 mg/dL — ABNORMAL HIGH (ref 70–99)
Potassium: 3.6 mmol/L (ref 3.5–5.2)
Sodium: 140 mmol/L (ref 134–144)
Total Protein: 6.8 g/dL (ref 6.0–8.5)
eGFR: 73 mL/min/{1.73_m2} (ref >59)

## 2023-11-13 ENCOUNTER — Telehealth: Payer: Self-pay | Admitting: Family Medicine

## 2023-11-13 NOTE — Telephone Encounter (Unsigned)
Copied from CRM [id number]. Topic: General - Other >> Nov 13, 2023  3:42 PM Ja-Kwan M wrote: Reason for CRM: Pt stated that he was told to call provide blood sugar reading for the last 3 days. Offered to document the blood sugar readings but pt declined and stated that he needed to speak with Dr. Laural Benes. Pt requests call back at [phone]

## 2023-11-14 ENCOUNTER — Encounter: Payer: Self-pay | Admitting: Family Medicine

## 2023-11-14 NOTE — Telephone Encounter (Signed)
See mychart message from the patient.

## 2023-11-15 ENCOUNTER — Telehealth: Payer: Self-pay | Admitting: Family Medicine

## 2023-11-15 ENCOUNTER — Other Ambulatory Visit: Payer: Self-pay | Admitting: Family Medicine

## 2023-11-15 DIAGNOSIS — E039 Hypothyroidism, unspecified: Secondary | ICD-10-CM

## 2023-11-15 DIAGNOSIS — E782 Mixed hyperlipidemia: Secondary | ICD-10-CM

## 2023-11-15 MED ORDER — LEVOTHYROXINE SODIUM 300 MCG PO TABS: 300.0000 ug | ORAL_TABLET | Freq: Every day | ORAL | 0 refills | Status: DC

## 2023-11-15 NOTE — Telephone Encounter (Signed)
Has not started insulin yet- see telephone call. Will pick up insulin and monitor sugars

## 2023-11-15 NOTE — Telephone Encounter (Signed)
Called patient and discussed results. He has not picked up his insulin yet- will pick up his insulin and monitor sugars daily. He will send a mychart message with them on Monday

## 2023-11-20 ENCOUNTER — Encounter: Payer: Self-pay | Admitting: Family Medicine

## 2023-11-20 ENCOUNTER — Telehealth (INDEPENDENT_AMBULATORY_CARE_PROVIDER_SITE_OTHER): Payer: Medicare Other | Admitting: Family Medicine

## 2023-11-20 DIAGNOSIS — E0840 Diabetes mellitus due to underlying condition with diabetic neuropathy, unspecified: Secondary | ICD-10-CM

## 2023-11-20 DIAGNOSIS — Z794 Long term (current) use of insulin: Secondary | ICD-10-CM

## 2023-11-20 DIAGNOSIS — E1140 Type 2 diabetes mellitus with diabetic neuropathy, unspecified: Secondary | ICD-10-CM

## 2023-11-20 MED ORDER — "PEN NEEDLES 5/16"" 30G X 8 MM MISC": 1.0000 | Freq: Every day | 3 refills | Status: DC

## 2023-11-20 MED ORDER — INSULIN DEGLUDEC 100 UNIT/ML ~~LOC~~ SOPN: 30.0000 [IU] | PEN_INJECTOR | Freq: Every day | SUBCUTANEOUS | 1 refills | Status: DC

## 2023-11-20 NOTE — Progress Notes (Signed)
Attempted to reach patient, LVM to call office back to get scheduled for a 6 week lab only visit. Also patient has an appointment today so we can schedule it then if need be. Put in CRM.

## 2023-11-20 NOTE — Progress Notes (Signed)
Appointment has been made

## 2023-11-20 NOTE — Progress Notes (Signed)
There were no vitals taken for this visit.   Subjective:    Patient ID: Manuel Burnett, male    DOB: [birth year], 67 y.o.   MRN: [id number]  HPI: Manuel Burnett is a 66 y.o. male  Chief Complaint  Patient presents with   Diabetes   DIABETES- started insulin on Friday Hypoglycemic episodes:no Polydipsia/polyuria: no Visual disturbance: no Chest pain: no Paresthesias: yes Glucose Monitoring: yes  Accucheck frequency: daily  Fasting glucose: 161, 168, 171 Taking Insulin?: yes  Long acting insulin:20 units daily Blood Pressure Monitoring: not checking Retinal Examination: Up to Date Foot Exam: Up to Date Diabetic Education: Completed Pneumovax: Up to Date Influenza: Not up to Date Aspirin: no   Relevant past medical, surgical, family and social history reviewed and updated as indicated. Interim medical history since our last visit reviewed. Allergies and medications reviewed and updated.  Review of Systems  Constitutional: Negative.   Respiratory: Negative.    Cardiovascular: Negative.   Musculoskeletal: Negative.   Neurological: Negative.   Psychiatric/Behavioral: Negative.      Per HPI unless specifically indicated above     Objective:    There were no vitals taken for this visit.  Wt Readings from Last 3 Encounters:  11/09/23 227 lb 6.4 oz (103.1 kg)  11/01/23 220 lb (99.8 kg)  07/24/23 231 lb 6.4 oz (105 kg)    Physical Exam Vitals and nursing note reviewed.  Constitutional:      General: He is not in acute distress.    Appearance: Normal appearance. He is not ill-appearing, toxic-appearing or diaphoretic.  HENT:     Head: Normocephalic and atraumatic.     Right Ear: External ear normal.     Left Ear: External ear normal.     Nose: Nose normal.     Mouth/Throat:     Mouth: Mucous membranes are moist.     Pharynx: Oropharynx is clear.  Eyes:     General: No scleral icterus.       Right eye: No discharge.        Left eye: No discharge.      Conjunctiva/sclera: Conjunctivae normal.     Pupils: Pupils are equal, round, and reactive to light.  Pulmonary:     Effort: Pulmonary effort is normal. No respiratory distress.     Comments: Speaking in full sentences Musculoskeletal:        General: Normal range of motion.     Cervical back: Normal range of motion.  Skin:    Coloration: Skin is not jaundiced or pale.     Findings: No bruising, erythema, lesion or rash.  Neurological:     Mental Status: He is alert and oriented to person, place, and time. Mental status is at baseline.  Psychiatric:        Mood and Affect: Mood normal.        Behavior: Behavior normal.        Thought Content: Thought content normal.        Judgment: Judgment normal.     Results for orders placed or performed in visit on 11/09/23  Bayer DCA Hb A1c Waived  Result Value Ref Range   HB A1C (BAYER DCA - WAIVED) 9.1 (H) 4.8 - 5.6 %  CBC with Differential/Platelet  Result Value Ref Range   WBC 7.0 3.4 - 10.8 x10E3/uL   RBC 5.21 4.14 - 5.80 x10E6/uL   Hemoglobin 15.4 13.0 - 17.7 g/dL   Hematocrit 76.2 83.1 - 51.0 %  MCV 89 79 - 97 fL   MCH 29.6 26.6 - 33.0 pg   MCHC 33.0 31.5 - 35.7 g/dL   RDW 16.1 09.6 - 04.5 %   Platelets 199 150 - 450 x10E3/uL   Neutrophils 64 Not Estab. %   Lymphs 23 Not Estab. %   Monocytes 9 Not Estab. %   Eos 3 Not Estab. %   Basos 1 Not Estab. %   Neutrophils Absolute 4.5 1.4 - 7.0 x10E3/uL   Lymphocytes Absolute 1.6 0.7 - 3.1 x10E3/uL   Monocytes Absolute 0.6 0.1 - 0.9 x10E3/uL   EOS (ABSOLUTE) 0.2 0.0 - 0.4 x10E3/uL   Basophils Absolute 0.1 0.0 - 0.2 x10E3/uL   Immature Granulocytes 0 Not Estab. %   Immature Grans (Abs) 0.0 0.0 - 0.1 x10E3/uL  Comprehensive metabolic panel  Result Value Ref Range   Glucose 194 (H) 70 - 99 mg/dL   BUN 15 8 - 27 mg/dL   Creatinine, Ser 4.09 0.76 - 1.27 mg/dL   eGFR 73 >81 XB/JYN/8.29   BUN/Creatinine Ratio 13 10 - 24   Sodium 140 134 - 144 mmol/L   Potassium 3.6 3.5 - 5.2  mmol/L   Chloride 97 96 - 106 mmol/L   CO2 22 20 - 29 mmol/L   Calcium 9.1 8.6 - 10.2 mg/dL   Total Protein 6.8 6.0 - 8.5 g/dL   Albumin 4.4 3.9 - 4.9 g/dL   Globulin, Total 2.4 1.5 - 4.5 g/dL   Bilirubin Total 0.4 0.0 - 1.2 mg/dL   Alkaline Phosphatase 90 44 - 121 IU/L   AST 14 0 - 40 IU/L   ALT 10 0 - 44 IU/L  Lipid Panel w/o Chol/HDL Ratio  Result Value Ref Range   Cholesterol, Total 336 (H) 100 - 199 mg/dL   Triglycerides 562 (HH) 0 - 149 mg/dL   HDL 36 (L) >13 mg/dL   VLDL Cholesterol Cal Comment (A) 5 - 40 mg/dL   LDL Chol Calc (NIH) Comment (A) 0 - 99 mg/dL   LDL CALC COMMENT: Comment   TSH  Result Value Ref Range   TSH 6.300 (H) 0.450 - 4.500 uIU/mL      Assessment & Plan:   Problem List Items Addressed This Visit       Endocrine   Diabetes mellitus due to underlying condition with diabetic neuropathy (HCC) - Primary    Feeling much better. Fasting sugars down to the high 160s-low 170s. Will increase his insulin to 30 units and recheck in 1 week. Call with any concerns. Continue to monitor.       Relevant Medications   insulin degludec (TRESIBA) 100 UNIT/ML FlexTouch Pen     Follow up plan: Return in about 1 week (around 11/27/2023) for virtual OK.   This visit was completed via video visit through MyChart due to the restrictions of the COVID-19 pandemic. All issues as above were discussed and addressed. Physical exam was done as above through visual confirmation on video through MyChart. If it was felt that the patient should be evaluated in the office, they were directed there. The patient verbally consented to this visit. Location of the patient: home Location of the provider: work Those involved with this call:  Provider: Olevia Perches, DO CMA: Malen Gauze, CMA Front Desk/Registration:  Servando Snare   Time spent on call:  15 minutes with patient face to face via video conference. More than 50% of this time was spent in counseling and coordination of  care. 23 minutes  total spent in review of patient's record and preparation of their chart.

## 2023-11-20 NOTE — Assessment & Plan Note (Signed)
Feeling much better. Fasting sugars down to the high 160s-low 170s. Will increase his insulin to 30 units and recheck in 1 week. Call with any concerns. Continue to monitor.

## 2023-11-29 ENCOUNTER — Other Ambulatory Visit: Payer: Self-pay | Admitting: Family Medicine

## 2023-11-30 ENCOUNTER — Other Ambulatory Visit: Payer: Self-pay

## 2023-11-30 ENCOUNTER — Telehealth: Payer: Self-pay | Admitting: Family Medicine

## 2023-11-30 MED ORDER — ACCU-CHEK GUIDE TEST VI STRP: ORAL_STRIP | 12 refills | Status: DC

## 2023-11-30 NOTE — Telephone Encounter (Signed)
RX request sent to provider.

## 2023-11-30 NOTE — Telephone Encounter (Signed)
Medication Refill -  Most Recent Primary Care Visit:  Provider: Olevia Perches P  Department: [facility]  Visit Type: MYCHART VIDEO VISIT  Date: 11/20/2023  Medication:  glucose blood (ACCU-CHEK GUIDE) test strips   *Patient is completely out  Has the patient contacted their pharmacy? Yes, advised to contact PCP. Patient states pharmacy was faxing a request over.  Is this the correct pharmacy for this prescription? Yes, the one listed below.  This is the patient's preferred pharmacy:  [id number] - [name], [place] - [address] AT [facility] [address] [facility] Phone: [phone] Fax: [phone]   Has the prescription been filled recently?   Receipt confirmed by pharmacy (06/24/2022  4:46 PM EDT)   Is the patient out of the medication?  Yes.  Has the patient been seen for an appointment in the last year OR does the patient have an upcoming appointment? Patient has a F/U virtual appt with Dr Laural Benes on 12.13.14

## 2023-11-30 NOTE — Telephone Encounter (Signed)
Unable to reorder, routing to the office.

## 2023-11-30 NOTE — Telephone Encounter (Signed)
Requested medication (s) are due for refill today - expired Rx  Requested medication (s) are on the active medication list -yes  Future visit scheduled - yes  Last refill: 06/24/22  Notes to clinic: expired Rx, no protocol listed  Requested Prescriptions  Pending Prescriptions Disp Refills   ACCU-CHEK GUIDE TEST test strip [Pharmacy Med Name: ACCU-CHEK GUIDE TEST STRIPS 50] 100 strip     Sig: USE STRIPS TO TEST AS MANY TIMES A DAY AS DIRECTED FROM DOCTOR     There is no refill protocol information for this order       Requested Prescriptions  Pending Prescriptions Disp Refills   ACCU-CHEK GUIDE TEST test strip [Pharmacy Med Name: ACCU-CHEK GUIDE TEST STRIPS 50] 100 strip     Sig: USE STRIPS TO TEST AS MANY TIMES A DAY AS DIRECTED FROM DOCTOR     There is no refill protocol information for this order

## 2023-12-01 ENCOUNTER — Telehealth (INDEPENDENT_AMBULATORY_CARE_PROVIDER_SITE_OTHER): Payer: Medicare Other | Admitting: Family Medicine

## 2023-12-01 ENCOUNTER — Encounter: Payer: Self-pay | Admitting: Family Medicine

## 2023-12-01 DIAGNOSIS — Z794 Long term (current) use of insulin: Secondary | ICD-10-CM

## 2023-12-01 DIAGNOSIS — E0840 Diabetes mellitus due to underlying condition with diabetic neuropathy, unspecified: Secondary | ICD-10-CM | POA: Diagnosis not present

## 2023-12-01 MED ORDER — INSULIN DEGLUDEC 100 UNIT/ML ~~LOC~~ SOPN: 30.0000 [IU] | PEN_INJECTOR | Freq: Every day | SUBCUTANEOUS | 1 refills | Status: DC

## 2023-12-01 NOTE — Assessment & Plan Note (Signed)
Tolerating his insulin very well. Will continue current regimen and recheck in 2.5 months. Call with any concerns.

## 2023-12-01 NOTE — Progress Notes (Signed)
There were no vitals taken for this visit.   Subjective:    Patient ID: Manuel Burnett, male    DOB: Apr 20, 1956, 67 y.o.   MRN: [id number]  HPI: Manuel Burnett is a 67 y.o. male  Chief Complaint  Patient presents with   Diabetes    Patient says he feel the increase of his medication, he has noticed some good readings and his sugars come down. Patient says his sugar this morning was 148. Patient says he has been having reading of 105. Patient says the breakouts he was having with his previous injections are now showing up with his insulin. Patient says he uses the cream and then it will dry the area out and he is good.   DIABETES Hypoglycemic episodes:no Polydipsia/polyuria: no Visual disturbance: no Chest pain: no Paresthesias: no Glucose Monitoring: yes  Accucheck frequency: Daily  Fasting glucose: 105-148 Taking Insulin?: yes  Long acting insulin: 30 units daily Blood Pressure Monitoring: not checking Retinal Examination: Up to Date Foot Exam: Up to Date Diabetic Education: Completed Pneumovax: Up to Date Influenza: Up to Date Aspirin: no  Relevant past medical, surgical, family and social history reviewed and updated as indicated. Interim medical history since our last visit reviewed. Allergies and medications reviewed and updated.  Review of Systems  Constitutional: Negative.   Respiratory: Negative.    Cardiovascular: Negative.   Gastrointestinal: Negative.   Musculoskeletal: Negative.   Neurological: Negative.   Psychiatric/Behavioral: Negative.      Per HPI unless specifically indicated above     Objective:    There were no vitals taken for this visit.  Wt Readings from Last 3 Encounters:  11/09/23 227 lb 6.4 oz (103.1 kg)  11/01/23 220 lb (99.8 kg)  07/24/23 231 lb 6.4 oz (105 kg)    Physical Exam Vitals and nursing note reviewed.  Constitutional:      General: He is not in acute distress.    Appearance: Normal appearance. He is not  ill-appearing, toxic-appearing or diaphoretic.  HENT:     Head: Normocephalic and atraumatic.     Right Ear: External ear normal.     Left Ear: External ear normal.     Nose: Nose normal.     Mouth/Throat:     Mouth: Mucous membranes are moist.     Pharynx: Oropharynx is clear.  Eyes:     General: No scleral icterus.       Right eye: No discharge.        Left eye: No discharge.     Extraocular Movements: Extraocular movements intact.     Conjunctiva/sclera: Conjunctivae normal.     Pupils: Pupils are equal, round, and reactive to light.  Cardiovascular:     Rate and Rhythm: Normal rate and regular rhythm.     Pulses: Normal pulses.     Heart sounds: Normal heart sounds. No murmur heard.    No friction rub. No gallop.  Pulmonary:     Effort: Pulmonary effort is normal. No respiratory distress.     Breath sounds: Normal breath sounds. No stridor. No wheezing, rhonchi or rales.  Chest:     Chest wall: No tenderness.  Musculoskeletal:        General: Normal range of motion.     Cervical back: Normal range of motion and neck supple.  Skin:    General: Skin is warm and dry.     Capillary Refill: Capillary refill takes less than 2 seconds.     Coloration:  Skin is not jaundiced or pale.     Findings: No bruising, erythema, lesion or rash.  Neurological:     General: No focal deficit present.     Mental Status: He is alert and oriented to person, place, and time. Mental status is at baseline.  Psychiatric:        Mood and Affect: Mood normal.        Behavior: Behavior normal.        Thought Content: Thought content normal.        Judgment: Judgment normal.     Results for orders placed or performed in visit on 11/09/23  Bayer DCA Hb A1c Waived   Collection Time: 11/09/23  9:43 AM  Result Value Ref Range   HB A1C (BAYER DCA - WAIVED) 9.1 (H) 4.8 - 5.6 %  CBC with Differential/Platelet   Collection Time: 11/09/23 10:10 AM  Result Value Ref Range   WBC 7.0 3.4 - 10.8 x10E3/uL    RBC 5.21 4.14 - 5.80 x10E6/uL   Hemoglobin 15.4 13.0 - 17.7 g/dL   Hematocrit 78.4 69.6 - 51.0 %   MCV 89 79 - 97 fL   MCH 29.6 26.6 - 33.0 pg   MCHC 33.0 31.5 - 35.7 g/dL   RDW 29.5 28.4 - 13.2 %   Platelets 199 150 - 450 x10E3/uL   Neutrophils 64 Not Estab. %   Lymphs 23 Not Estab. %   Monocytes 9 Not Estab. %   Eos 3 Not Estab. %   Basos 1 Not Estab. %   Neutrophils Absolute 4.5 1.4 - 7.0 x10E3/uL   Lymphocytes Absolute 1.6 0.7 - 3.1 x10E3/uL   Monocytes Absolute 0.6 0.1 - 0.9 x10E3/uL   EOS (ABSOLUTE) 0.2 0.0 - 0.4 x10E3/uL   Basophils Absolute 0.1 0.0 - 0.2 x10E3/uL   Immature Granulocytes 0 Not Estab. %   Immature Grans (Abs) 0.0 0.0 - 0.1 x10E3/uL  Comprehensive metabolic panel   Collection Time: 11/09/23 10:10 AM  Result Value Ref Range   Glucose 194 (H) 70 - 99 mg/dL   BUN 15 8 - 27 mg/dL   Creatinine, Ser 4.40 0.76 - 1.27 mg/dL   eGFR 73 >10 UV/OZD/6.64   BUN/Creatinine Ratio 13 10 - 24   Sodium 140 134 - 144 mmol/L   Potassium 3.6 3.5 - 5.2 mmol/L   Chloride 97 96 - 106 mmol/L   CO2 22 20 - 29 mmol/L   Calcium 9.1 8.6 - 10.2 mg/dL   Total Protein 6.8 6.0 - 8.5 g/dL   Albumin 4.4 3.9 - 4.9 g/dL   Globulin, Total 2.4 1.5 - 4.5 g/dL   Bilirubin Total 0.4 0.0 - 1.2 mg/dL   Alkaline Phosphatase 90 44 - 121 IU/L   AST 14 0 - 40 IU/L   ALT 10 0 - 44 IU/L  Lipid Panel w/o Chol/HDL Ratio   Collection Time: 11/09/23 10:10 AM  Result Value Ref Range   Cholesterol, Total 336 (H) 100 - 199 mg/dL   Triglycerides 403 (HH) 0 - 149 mg/dL   HDL 36 (L) >47 mg/dL   VLDL Cholesterol Cal Comment (A) 5 - 40 mg/dL   LDL Chol Calc (NIH) Comment (A) 0 - 99 mg/dL   LDL CALC COMMENT: Comment   TSH   Collection Time: 11/09/23 10:10 AM  Result Value Ref Range   TSH 6.300 (H) 0.450 - 4.500 uIU/mL      Assessment & Plan:   Problem List Items Addressed This Visit  Endocrine   Diabetes mellitus due to underlying condition with diabetic neuropathy (HCC) - Primary    Tolerating his insulin very well. Will continue current regimen and recheck in 2.5 months. Call with any concerns.       Relevant Medications   insulin degludec (TRESIBA) 100 UNIT/ML FlexTouch Pen     Follow up plan: Return in about 10 weeks (around 02/09/2024).    This visit was completed via video visit through MyChart due to the restrictions of the COVID-19 pandemic. All issues as above were discussed and addressed. Physical exam was done as above through visual confirmation on video through MyChart. If it was felt that the patient should be evaluated in the office, they were directed there. The patient verbally consented to this visit. Location of the patient: home Location of the provider: work Those involved with this call:  Provider: Olevia Perches, DO CMA: Malen Gauze, CMA Front Desk/Registration:  Servando Snare   Time spent on call:  15 minutes with patient face to face via video conference. More than 50% of this time was spent in counseling and coordination of care. 23 minutes total spent in review of patient's record and preparation of their chart.

## 2023-12-04 NOTE — Progress Notes (Signed)
Appointment scheduled.

## 2023-12-29 ENCOUNTER — Telehealth: Payer: Self-pay | Admitting: Family Medicine

## 2023-12-29 NOTE — Telephone Encounter (Signed)
 We do not have [place] in office. Is there anything we can do/give the patient?

## 2023-12-29 NOTE — Telephone Encounter (Signed)
 Pt is calling in because he wants to know if the office has 1 pen of Triseba because the pharmacy is out of it. Pt says he doesn't have enough for another shot. Pt says he just needs 1 pen to last him until Monday. Please follow up with pt.

## 2023-12-29 NOTE — Telephone Encounter (Signed)
Called and notified patient of Dr. Johnson's message.  

## 2023-12-29 NOTE — Telephone Encounter (Signed)
 We have a toujeo he can use until Monday- dosing is the same. I'll sign one out

## 2024-02-05 ENCOUNTER — Other Ambulatory Visit: Payer: Self-pay | Admitting: Family Medicine

## 2024-02-05 DIAGNOSIS — E0840 Diabetes mellitus due to underlying condition with diabetic neuropathy, unspecified: Secondary | ICD-10-CM

## 2024-02-06 ENCOUNTER — Other Ambulatory Visit: Payer: Medicare Other

## 2024-02-06 DIAGNOSIS — Z794 Long term (current) use of insulin: Secondary | ICD-10-CM

## 2024-02-06 DIAGNOSIS — E039 Hypothyroidism, unspecified: Secondary | ICD-10-CM

## 2024-02-06 DIAGNOSIS — E782 Mixed hyperlipidemia: Secondary | ICD-10-CM

## 2024-02-06 LAB — BAYER DCA HB A1C WAIVED: HB A1C (BAYER DCA - WAIVED): 7 % — ABNORMAL HIGH (ref 4.8–5.6)

## 2024-02-07 ENCOUNTER — Telehealth: Payer: Medicare Other | Admitting: Family Medicine

## 2024-02-07 ENCOUNTER — Encounter: Payer: Self-pay | Admitting: Family Medicine

## 2024-02-07 DIAGNOSIS — E0840 Diabetes mellitus due to underlying condition with diabetic neuropathy, unspecified: Secondary | ICD-10-CM

## 2024-02-07 DIAGNOSIS — Z794 Long term (current) use of insulin: Secondary | ICD-10-CM

## 2024-02-07 DIAGNOSIS — E782 Mixed hyperlipidemia: Secondary | ICD-10-CM

## 2024-02-07 DIAGNOSIS — I129 Hypertensive chronic kidney disease with stage 1 through stage 4 chronic kidney disease, or unspecified chronic kidney disease: Secondary | ICD-10-CM

## 2024-02-07 DIAGNOSIS — E291 Testicular hypofunction: Secondary | ICD-10-CM

## 2024-02-07 DIAGNOSIS — E039 Hypothyroidism, unspecified: Secondary | ICD-10-CM | POA: Diagnosis not present

## 2024-02-07 DIAGNOSIS — E785 Hyperlipidemia, unspecified: Secondary | ICD-10-CM

## 2024-02-07 LAB — COMPREHENSIVE METABOLIC PANEL
ALT: 10 [IU]/L (ref 0–44)
AST: 14 [IU]/L (ref 0–40)
Albumin: 4.1 g/dL (ref 3.9–4.9)
Alkaline Phosphatase: 39 [IU]/L — ABNORMAL LOW (ref 44–121)
BUN/Creatinine Ratio: 18 (ref 10–24)
BUN: 23 mg/dL (ref 8–27)
Bilirubin Total: 0.3 mg/dL (ref 0.0–1.2)
CO2: 24 mmol/L (ref 20–29)
Calcium: 9.1 mg/dL (ref 8.6–10.2)
Chloride: 106 mmol/L (ref 96–106)
Creatinine, Ser: 1.25 mg/dL (ref 0.76–1.27)
Globulin, Total: 2.4 g/dL (ref 1.5–4.5)
Glucose: 68 mg/dL — ABNORMAL LOW (ref 70–99)
Potassium: 3.8 mmol/L (ref 3.5–5.2)
Sodium: 142 mmol/L (ref 134–144)
Total Protein: 6.5 g/dL (ref 6.0–8.5)
eGFR: 64 mL/min/{1.73_m2} (ref >59)

## 2024-02-07 LAB — LIPID PANEL W/O CHOL/HDL RATIO
Cholesterol, Total: 156 mg/dL (ref 100–199)
HDL: 42 mg/dL (ref >39)
LDL Chol Calc (NIH): 74 mg/dL (ref 0–99)
Triglycerides: 241 mg/dL — ABNORMAL HIGH (ref 0–149)
VLDL Cholesterol Cal: 40 mg/dL (ref 5–40)

## 2024-02-07 LAB — TSH: TSH: 0.012 u[IU]/mL — ABNORMAL LOW (ref 0.450–4.500)

## 2024-02-07 MED ORDER — LEVOTHYROXINE SODIUM 200 MCG PO TABS: 200.0000 ug | ORAL_TABLET | Freq: Every day | ORAL | 0 refills | Status: DC

## 2024-02-07 MED ORDER — PIOGLITAZONE HCL 30 MG PO TABS: 30.0000 mg | ORAL_TABLET | Freq: Every day | ORAL | 1 refills | Status: DC

## 2024-02-07 MED ORDER — HYDROCHLOROTHIAZIDE 25 MG PO TABS: 25.0000 mg | ORAL_TABLET | Freq: Every day | ORAL | 1 refills | Status: DC

## 2024-02-07 MED ORDER — GLIPIZIDE 5 MG PO TABS: 5.0000 mg | ORAL_TABLET | Freq: Every day | ORAL | 1 refills | Status: DC

## 2024-02-07 MED ORDER — METFORMIN HCL ER 500 MG PO TB24: ORAL_TABLET | ORAL | 1 refills | Status: DC

## 2024-02-07 MED ORDER — TESTOSTERONE CYPIONATE 200 MG/ML IM SOLN: INTRAMUSCULAR | 2 refills | Status: DC

## 2024-02-07 MED ORDER — AMLODIPINE BESYLATE 10 MG PO TABS: 10.0000 mg | ORAL_TABLET | Freq: Every day | ORAL | 1 refills | Status: DC

## 2024-02-07 MED ORDER — ROSUVASTATIN CALCIUM 10 MG PO TABS: ORAL_TABLET | ORAL | 1 refills | Status: DC

## 2024-02-07 MED ORDER — FENOFIBRATE 160 MG PO TABS: 160.0000 mg | ORAL_TABLET | Freq: Every day | ORAL | 1 refills | Status: DC

## 2024-02-07 MED ORDER — LEVOTHYROXINE SODIUM 75 MCG PO TABS: 75.0000 ug | ORAL_TABLET | Freq: Every day | ORAL | 0 refills | Status: DC

## 2024-02-07 MED ORDER — LOSARTAN POTASSIUM 100 MG PO TABS: 100.0000 mg | ORAL_TABLET | Freq: Every day | ORAL | 1 refills | Status: DC

## 2024-02-07 NOTE — Assessment & Plan Note (Signed)
 Over treated. Will cut his levothyroxine to and recheck in 3 months. Call with any concerns.

## 2024-02-07 NOTE — Assessment & Plan Note (Signed)
Under good control on current regimen. Continue current regimen. Continue to monitor. Call with any concerns. Refills given. Labs normal.

## 2024-02-07 NOTE — Assessment & Plan Note (Signed)
 Under good control on current regimen. Continue current regimen. Continue to monitor. Call with any concerns. Refills given.

## 2024-02-07 NOTE — Assessment & Plan Note (Signed)
 Doing great with A1c of 7.0. Will continue insulin- going low in the AM. Stop AM glipizide. Call with any concerns.

## 2024-02-07 NOTE — Progress Notes (Signed)
 There were no vitals taken for this visit.   Subjective:    Patient ID: Manuel Burnett, male    DOB: November 29, 1956, 68 y.o.   MRN: [id number]  HPI: Manuel Burnett is a 68 y.o. male  Chief Complaint  Patient presents with   Diabetes    A1c- Down to 7 and he is happy about this   DIABETES Hypoglycemic episodes:no Polydipsia/polyuria: no Visual disturbance: no Chest pain: no Paresthesias: no Glucose Monitoring: yes  Accucheck frequency: TID  Fasting glucose: 80s Taking Insulin?: yes  Long acting insulin: 30 units Blood Pressure Monitoring: not checking Retinal Examination: Up to Date Foot Exam: Up to Date Diabetic Education: Completed Pneumovax: Up to Date Influenza: Up to Date Aspirin: no  HYPOTHYROIDISM Thyroid control status:uncontrolled Satisfied with current treatment? yes Medication side effects: no Medication compliance: excellent compliance Etiology of hypothyroidism:  Recent dose adjustment:yes Fatigue: no Cold intolerance: no Heat intolerance: no Weight gain: no Weight loss: no Constipation: no Diarrhea/loose stools: no Palpitations: no Lower extremity edema: no Anxiety/depressed mood: no  HYPERTENSION / HYPERLIPIDEMIA Satisfied with current treatment? yes Duration of hypertension: chronic BP monitoring frequency: not checking BP medication side effects: no Past BP meds: amlodipine, losartan, HCTZ Duration of hyperlipidemia: chronic Cholesterol medication side effects: no Cholesterol supplements: none Past cholesterol medications: fenofibrate, crestor Medication compliance: excellent compliance Aspirin: no Recent stressors: no Recurrent headaches: no Visual changes: no Palpitations: no Dyspnea: no Chest pain: no Lower extremity edema: no Dizzy/lightheaded: no  LOW TESTOSTERONE Duration: chronic Status: controlled  Satisfied with current treatment:  yes Previous testosterone therapies: androgel, injections Medication side effects:   no Medication compliance: excellent compliance Decreased libido: no Fatigue: yes Depressed mood: no Muscle weakness: yes Erectile dysfunction: no   Relevant past medical, surgical, family and social history reviewed and updated as indicated. Interim medical history since our last visit reviewed. Allergies and medications reviewed and updated.  Review of Systems  Constitutional: Negative.   Respiratory: Negative.    Cardiovascular: Negative.   Gastrointestinal: Negative.   Musculoskeletal: Negative.   Psychiatric/Behavioral: Negative.      Per HPI unless specifically indicated above     Objective:    There were no vitals taken for this visit.  Wt Readings from Last 3 Encounters:  11/09/23 227 lb 6.4 oz (103.1 kg)  11/01/23 220 lb (99.8 kg)  07/24/23 231 lb 6.4 oz (105 kg)    Physical Exam Constitutional:      General: He is not in acute distress.    Appearance: Normal appearance. He is well-developed. He is not ill-appearing, toxic-appearing or diaphoretic.  HENT:     Head: Normocephalic and atraumatic.     Right Ear: Hearing and external ear normal.     Left Ear: Hearing and external ear normal.     Nose: Nose normal.  Eyes:     General: Lids are normal. No scleral icterus.       Right eye: No discharge.        Left eye: No discharge.     Extraocular Movements: Extraocular movements intact.     Conjunctiva/sclera: Conjunctivae normal.     Pupils: Pupils are equal, round, and reactive to light.  Pulmonary:     Effort: Pulmonary effort is normal. No respiratory distress.  Musculoskeletal:        General: Normal range of motion.     Cervical back: Normal range of motion.  Skin:    Coloration: Skin is not jaundiced or pale.  Findings: No bruising, erythema, lesion or rash.  Neurological:     General: No focal deficit present.     Mental Status: He is alert and oriented to person, place, and time.  Psychiatric:        Mood and Affect: Mood normal.         Speech: Speech normal.        Behavior: Behavior normal.        Thought Content: Thought content normal.        Judgment: Judgment normal.     Results for orders placed or performed in visit on 02/06/24  Comprehensive metabolic panel   Collection Time: 02/06/24  8:30 AM  Result Value Ref Range   Glucose 68 (L) 70 - 99 mg/dL   BUN 23 8 - 27 mg/dL   Creatinine, Ser 1.61 0.76 - 1.27 mg/dL   eGFR 64 >09 UE/AVW/0.98   BUN/Creatinine Ratio 18 10 - 24   Sodium 142 134 - 144 mmol/L   Potassium 3.8 3.5 - 5.2 mmol/L   Chloride 106 96 - 106 mmol/L   CO2 24 20 - 29 mmol/L   Calcium 9.1 8.6 - 10.2 mg/dL   Total Protein 6.5 6.0 - 8.5 g/dL   Albumin 4.1 3.9 - 4.9 g/dL   Globulin, Total 2.4 1.5 - 4.5 g/dL   Bilirubin Total 0.3 0.0 - 1.2 mg/dL   Alkaline Phosphatase 39 (L) 44 - 121 IU/L   AST 14 0 - 40 IU/L   ALT 10 0 - 44 IU/L  Bayer DCA Hb A1c Waived   Collection Time: 02/06/24  8:30 AM  Result Value Ref Range   HB A1C (BAYER DCA - WAIVED) 7.0 (H) 4.8 - 5.6 %  Lipid Panel w/o Chol/HDL Ratio   Collection Time: 02/06/24  8:30 AM  Result Value Ref Range   Cholesterol, Total 156 100 - 199 mg/dL   Triglycerides 119 (H) 0 - 149 mg/dL   HDL 42 >14 mg/dL   VLDL Cholesterol Cal 40 5 - 40 mg/dL   LDL Chol Calc (NIH) 74 0 - 99 mg/dL  TSH   Collection Time: 02/06/24  8:30 AM  Result Value Ref Range   TSH 0.012 (L) 0.450 - 4.500 uIU/mL      Assessment & Plan:   Problem List Items Addressed This Visit       Endocrine   Diabetes mellitus due to underlying condition with diabetic neuropathy (HCC)    Doing great with A1c of 7.0. Will continue insulin- going low in the AM. Stop AM glipizide. Call with any concerns.       Relevant Medications   glipiZIDE (GLUCOTROL) 5 MG tablet   losartan (COZAAR) 100 MG tablet   metFORMIN (GLUCOPHAGE-XR) 500 MG 24 hr tablet   pioglitazone (ACTOS) 30 MG tablet   rosuvastatin (CRESTOR) 10 MG tablet   Male hypogonadism   Under good control on current  regimen. Continue current regimen. Continue to monitor. Call with any concerns. Refills given.        Hypothyroidism   Over treated. Will cut his levothyroxine to and recheck in 3 months. Call with any concerns.       Relevant Medications   levothyroxine (SYNTHROID) 200 MCG tablet   levothyroxine (SYNTHROID) 75 MCG tablet     Genitourinary   Benign hypertensive renal disease - Primary   Under good control on current regimen. Continue current regimen. Continue to monitor. Call with any concerns. Refills given. Labs normal.  Other   Hyperlipemia   Under good control on current regimen. Continue current regimen. Continue to monitor. Call with any concerns. Refills given. Labs normal.       Relevant Medications   amLODipine (NORVASC) 10 MG tablet   fenofibrate 160 MG tablet   hydrochlorothiazide (HYDRODIURIL) 25 MG tablet   losartan (COZAAR) 100 MG tablet   rosuvastatin (CRESTOR) 10 MG tablet     Follow up plan: Return in about 3 months (around 05/06/2024).   This visit was completed via video visit through MyChart due to the restrictions of the COVID-19 pandemic. All issues as above were discussed and addressed. Physical exam was done as above through visual confirmation on video through MyChart. If it was felt that the patient should be evaluated in the office, they were directed there. The patient verbally consented to this visit. Location of the patient: home Location of the provider: home Those involved with this call:  Provider: Olevia Perches, DO CMA:  Arnetha Gula, CMA Front Desk/Registration:  Servando Snare   Time spent on call:  25 minutes with patient face to face via video conference. More than 50% of this time was spent in counseling and coordination of care. 40 minutes total spent in review of patient's record and preparation of their chart.

## 2024-03-20 ENCOUNTER — Other Ambulatory Visit: Payer: Self-pay | Admitting: Family Medicine

## 2024-03-22 NOTE — Telephone Encounter (Signed)
 Requested Prescriptions  Pending Prescriptions Disp Refills   insulin degludec (TRESIBA FLEXTOUCH) 100 UNIT/ML FlexTouch Pen [Pharmacy Med Name: TRESIBA FLEXTOUCH PEN ([id number]] 15 mL 0    Sig: INJECT 30 UNITS UNDER THE SKIN DAILY     Endocrinology:  Diabetes - Insulins Passed - 03/22/2024  9:07 AM      Passed - HBA1C is between 0 and 7.9 and within 180 days    Hemoglobin A1C  Date Value Ref Range Status  07/09/2018 6.9  Final   HB A1C (BAYER DCA - WAIVED)  Date Value Ref Range Status  02/06/2024 7.0 (H) 4.8 - 5.6 % Final    Comment:             Prediabetes: 5.7 - 6.4          Diabetes: >6.4          Glycemic control for adults with diabetes: <7.0          Passed - Valid encounter within last 6 months    Recent Outpatient Visits           1 month ago Benign hypertensive renal disease   [facility] [place], Oralia Rud, DO       Future Appointments             In 1 month Johnson, Oralia Rud, DO  [facility]

## 2024-04-19 NOTE — Progress Notes (Unsigned)
 [facility] [address] [place], [place] [address]  Pulmonary Sleep Medicine   Office Visit Note  Patient Name: Manuel Burnett DOB: Dec 06, 1956 MRN [id number]    Chief Complaint: Obstructive Sleep Apnea visit  Brief History:  Manuel Burnett is seen today for an annual follow up visit for CPAP@ 10 cmH2O. The patient has a 14 year history of sleep apnea. Patient is using PAP nightly.  The patient feels rested after sleeping with PAP.  The patient reports benefiting from PAP use. Reported sleepiness is  improved and the Epworth Sleepiness Score is 8 out of 24. The patient does not take naps. The patient complains of the following: none.  The compliance download shows 100% compliance with an average use time of 7 hours 11 minutes. The AHI is 0.8.  The patient does not complain of limb movements disrupting sleep. The patient continues to require PAP therapy in order to eliminate sleep apnea.   ROS  General: (-) fever, (-) chills, (-) night sweat Nose and Sinuses: (-) nasal stuffiness or itchiness, (-) postnasal drip, (-) nosebleeds, (-) sinus trouble. Mouth and Throat: (-) sore throat, (-) hoarseness. Neck: (-) swollen glands, (-) enlarged thyroid , (-) neck pain. Respiratory: - cough, - shortness of breath, - wheezing. Neurologic: - numbness, - tingling. Psychiatric: - anxiety, - depression   Current Medication: Outpatient Encounter Medications as of 04/22/2024  Medication Sig   albuterol  (PROAIR  HFA) 108 (90 Base) MCG/ACT inhaler Inhale 2 puffs into the lungs every 4 (four) hours as needed for wheezing or shortness of breath.   amLODipine  (NORVASC ) 10 MG tablet Take 1 tablet (10 mg total) by mouth daily.   B-D INTEGRA SYRINGE 22G X 1-1/2" 3 ML MISC Inject testosterone  1x every 2 weeks   B-D ULTRAFINE III SHORT PEN 31G X 8 MM MISC Inject into the skin daily.   BD HYPODERMIC NEEDLE 18G X 1" MISC INJECT TESTOSTERONE  EVERY 2 WEEKS AS DIRECTED   cyclobenzaprine  (FLEXERIL ) 5 MG  tablet Take 1 tablet (5 mg total) by mouth 3 (three) times daily as needed.   fenofibrate  160 MG tablet Take 1 tablet (160 mg total) by mouth daily. with food   glipiZIDE  (GLUCOTROL ) 5 MG tablet Take 1 tablet (5 mg total) by mouth daily.   glucose blood (ACCU-CHEK GUIDE TEST) test strip Use as instructed   hydrochlorothiazide  (HYDRODIURIL ) 25 MG tablet Take 1 tablet (25 mg total) by mouth daily.   insulin  degludec (TRESIBA  FLEXTOUCH) 100 UNIT/ML FlexTouch Pen INJECT 30 UNITS UNDER THE SKIN DAILY   Insulin  Pen Needle (PEN NEEDLES 5/16") 30G X 8 MM MISC 1 each by Does not apply route daily.   ketoconazole (NIZORAL) 2 % shampoo SMARTSIG:Topical 2-3 Times Weekly   levothyroxine  (SYNTHROID ) 200 MCG tablet Take 1 tablet (200 mcg total) by mouth daily before breakfast. Take 1 pill a day. To be taken with the 75mcg for 275mcg daily   levothyroxine  (SYNTHROID ) 75 MCG tablet Take 1 tablet (75 mcg total) by mouth daily. Take 1 pill a day. To be taken with the 200mcg for 275mcg daily   losartan  (COZAAR ) 100 MG tablet Take 1 tablet (100 mg total) by mouth daily.   metFORMIN  (GLUCOPHAGE -XR) 500 MG 24 hr tablet TAKE 2 TABLETS(1000 MG) BY MOUTH IN THE MORNING AND AT BEDTIME   Multiple Vitamin (MULTIVITAMIN WITH MINERALS) TABS tablet Take 1 tablet by mouth daily.   pioglitazone  (ACTOS ) 30 MG tablet Take 1 tablet (30 mg total) by mouth daily.   rosuvastatin  (CRESTOR ) 10  MG tablet TAKE 1 TABLET(10 MG) BY MOUTH DAILY   testosterone  cypionate (DEPOTESTOSTERONE CYPIONATE) 200 MG/ML injection ADMINISTER 0.5 ML(100 MG) IN THE MUSCLE EVERY 14 DAYS   triamcinolone  cream (KENALOG ) 0.1 % Apply topically 2 (two) times daily.   No facility-administered encounter medications on file as of 04/22/2024.    Surgical History: Past Surgical History:  Procedure Laterality Date   CATARACT EXTRACTION W/PHACO Right 12/26/2017   Procedure: CATARACT EXTRACTION PHACO AND INTRAOCULAR LENS PLACEMENT (IOC);  Surgeon: Clair Crews, MD;   Location: [facility] ORS;  Service: Ophthalmology;  Laterality: Right;  US  00:21.8 AP% 21.1 CDE 2.64 FLUID PACK LOT # [id number]   CATARACT EXTRACTION W/PHACO Left 01/09/2018   Procedure: CATARACT EXTRACTION PHACO AND INTRAOCULAR LENS PLACEMENT (IOC);  Surgeon: Clair Crews, MD;  Location: [facility] ORS;  Service: Ophthalmology;  Laterality: Left;  US  00:28.9 AP% 10.0 CDE 2.90 Fluid pcak lot # [id number]   CLAVICLE SURGERY     internal fixation   COLONOSCOPY WITH PROPOFOL  N/A 04/06/2020   Procedure: COLONOSCOPY WITH PROPOFOL ;  Surgeon: Selena Daily, MD;  Location: [facility] ENDOSCOPY;  Service: Gastroenterology;  Laterality: N/A;  priority 4   EYE SURGERY Bilateral 12/2017   Cataract   FRACTURE SURGERY     HEMORRHOID SURGERY     TONSILLECTOMY     TONSILLECTOMY AND ADENOIDECTOMY      Medical History: Past Medical History:  Diagnosis Date   Cancer (HCC)    SKIN   Chronic kidney disease    Diabetes mellitus without complication (HCC)    Dysplastic nevus 01/08/2021   right upper back biopsy proven compound dysplastic nevus with mild atypia margins involved    Erectile dysfunction    GERD (gastroesophageal reflux disease)    Hyperlipidemia    Hypertension    Hypogonadism male    Hypothyroidism    Proteinuria    Sleep apnea    CPAP   Thyroid  disease     Family History: Non contributory to the present illness  Social History: Social History   Socioeconomic History   Marital status: Married    Spouse name: Not on file   Number of children: Not on file   Years of education: Not on file   Highest education level: Some college, no degree  Occupational History   Not on file  Tobacco Use   Smoking status: Every Day    Current packs/day: 0.50    Average packs/day: 0.5 packs/day for 54.3 years (27.2 ttl pk-yrs)    Types: Cigarettes    Start date: 12/19/1969   Smokeless tobacco: Never  Vaping Use   Vaping status: Never Used  Substance and Sexual Activity   Alcohol use: Not  Currently    Alcohol/week: 0.0 standard drinks of alcohol   Drug use: No   Sexual activity: Not Currently  Other Topics Concern   Not on file  Social History Narrative   Not on file   Social Drivers of Health   Financial Resource Strain: Low Risk  (11/09/2023)   Overall Financial Resource Strain (CARDIA)    Difficulty of Paying Living Expenses: Not hard at all  Food Insecurity: No Food Insecurity (11/09/2023)   Hunger Vital Sign    Worried About Running Out of Food in the Last Year: Never true    Ran Out of Food in the Last Year: Never true  Transportation Needs: No Transportation Needs (11/09/2023)   PRAPARE - Administrator, Civil Service (Medical): No    Lack of Transportation (Non-Medical):  No  Physical Activity: Unknown (11/09/2023)   Exercise Vital Sign    Days of Exercise per Week: Patient declined    Minutes of Exercise per Session: Not on file  Stress: No Stress Concern Present (11/09/2023)   [employer] of Occupational Health - Occupational Stress Questionnaire    Feeling of Stress : Not at all  Social Connections: Socially Integrated (11/09/2023)   Social Connection and Isolation Panel [NHANES]    Frequency of Communication with Friends and Family: More than three times a week    Frequency of Social Gatherings with Friends and Family: More than three times a week    Attends Religious Services: More than 4 times per year    Active Member of [employer] or Organizations: Yes    Attends Engineer, structural: More than 4 times per year    Marital Status: Married  Catering manager Violence: Not At Risk (11/09/2023)   Humiliation, Afraid, Rape, and Kick questionnaire    Fear of Current or Ex-Partner: No    Emotionally Abused: No    Physically Abused: No    Sexually Abused: No    Vital Signs: Blood pressure (!) 199/107, pulse 71, resp. rate 16, height 6\' 1"  (1.854 m), weight 236 lb (107 kg), SpO2 96%. Body mass index is 31.14 kg/m.     Examination: General Appearance: The patient is well-developed, well-nourished, and in no distress. Neck Circumference: 45 cm Skin: Gross inspection of skin unremarkable. Head: normocephalic, no gross deformities. Eyes: no gross deformities noted. ENT: ears appear grossly normal Neurologic: Alert and oriented. No involuntary movements.  STOP BANG RISK ASSESSMENT S (snore) Have you been told that you snore?     NO   T (tired) Are you often tired, fatigued, or sleepy during the day?   NO  O (obstruction) Do you stop breathing, choke, or gasp during sleep? NO   P (pressure) Do you have or are you being treated for high blood pressure? YES   B (BMI) Is your body index greater than 35 kg/m? NO   A (age) Are you 69 years old or older? YES   N (neck) Do you have a neck circumference greater than 16 inches?   YES   G (gender) Are you a male? YES   TOTAL STOP/BANG "YES" ANSWERS 4       A STOP-Bang score of 2 or less is considered low risk, and a score of 5 or more is high risk for having either moderate or severe OSA. For people who score 3 or 4, doctors may need to perform further assessment to determine how likely they are to have OSA.         EPWORTH SLEEPINESS SCALE:  Scale:  (0)= no chance of dozing; (1)= slight chance of dozing; (2)= moderate chance of dozing; (3)= high chance of dozing  Chance  Situtation    Sitting and reading: 1    Watching TV: 1    Sitting Inactive in public: 1    As a passenger in car: 0      Lying down to rest: 3    Sitting and talking: 0    Sitting quielty after lunch: 2    In a car, stopped in traffic: 0   TOTAL SCORE:   8 out of 24    SLEEP STUDIES:  Split Study (08/2009) AHI 86/hr, min SpO2 79%, recommend CPAP@ 10 cmH2O   CPAP COMPLIANCE DATA:  Date Range: 04/20/2023-04/18/2024  Average Daily Use: 7 hours 11  minutes  Median Use: 7 hours 7 minutes  Compliance for > 4 Hours: 100%  AHI: 0.8/ respiratory events per  hour  Days Used: 365/365 days  Mask Leak: 22.1  95th Percentile Pressure: 10         LABS: Recent Results (from the past 2160 hours)  Comprehensive metabolic panel     Status: Abnormal   Collection Time: 02/06/24  8:30 AM  Result Value Ref Range   Glucose 68 (L) 70 - 99 mg/dL   BUN 23 8 - 27 mg/dL   Creatinine, Ser 2.53 0.76 - 1.27 mg/dL   eGFR 64 >66 YQ/IHK/7.42   BUN/Creatinine Ratio 18 10 - 24   Sodium 142 134 - 144 mmol/L   Potassium 3.8 3.5 - 5.2 mmol/L   Chloride 106 96 - 106 mmol/L   CO2 24 20 - 29 mmol/L   Calcium  9.1 8.6 - 10.2 mg/dL   Total Protein 6.5 6.0 - 8.5 g/dL   Albumin 4.1 3.9 - 4.9 g/dL   Globulin, Total 2.4 1.5 - 4.5 g/dL   Bilirubin Total 0.3 0.0 - 1.2 mg/dL   Alkaline Phosphatase 39 (L) 44 - 121 IU/L   AST 14 0 - 40 IU/L   ALT 10 0 - 44 IU/L  Bayer DCA Hb A1c Waived     Status: Abnormal   Collection Time: 02/06/24  8:30 AM  Result Value Ref Range   HB A1C (BAYER DCA - WAIVED) 7.0 (H) 4.8 - 5.6 %    Comment:          Prediabetes: 5.7 - 6.4          Diabetes: >6.4          Glycemic control for adults with diabetes: <7.0   Lipid Panel w/o Chol/HDL Ratio     Status: Abnormal   Collection Time: 02/06/24  8:30 AM  Result Value Ref Range   Cholesterol, Total 156 100 - 199 mg/dL   Triglycerides 595 (H) 0 - 149 mg/dL   HDL 42 >63 mg/dL   VLDL Cholesterol Cal 40 5 - 40 mg/dL   LDL Chol Calc (NIH) 74 0 - 99 mg/dL  TSH     Status: Abnormal   Collection Time: 02/06/24  8:30 AM  Result Value Ref Range   TSH 0.012 (L) 0.450 - 4.500 uIU/mL    Radiology: DG Ribs Unilateral W/Chest Left Result Date: 11/01/2023 CLINICAL DATA:  Marvell Slider last week with subsequent pain. EXAM: LEFT RIBS AND CHEST - 3+ VIEW COMPARISON:  02/06/2006 FINDINGS: Stable cardiomediastinal silhouette. Bibasilar atelectasis. Otherwise no focal consolidation, pleural effusion, or pneumothorax. Remote fracture left scapula. Plate and screw fixation left clavicle. Remote left 3rd-6th rib  fractures. The area of symptomatic concern as indicated by the patient was denoted with a metallic skin BB by the technologist. No acute fracture. IMPRESSION: No evidence of acute fracture. Electronically Signed   By: Rozell Cornet M.D.   On: 11/01/2023 20:08    No results found.  No results found.    Assessment and Plan: Patient Active Problem List   Diagnosis Date Noted   Advance directive discussed with patient 04/17/2023   CPAP use counseling 06/13/2022   Hypertension associated with diabetes (HCC) 06/13/2022   COVID-19 05/25/2021   Encounter for commercial driving license (CDL) exam 87/56/4332   Benign hypertensive renal disease 08/03/2015   Hyperlipemia 08/03/2015   Diabetes mellitus due to underlying condition with diabetic neuropathy (HCC) 08/03/2015   Chronic kidney disease, stage II (mild) 08/03/2015  Male hypogonadism 08/03/2015   Hypothyroidism 08/03/2015   Proteinuria 08/03/2015   ED (erectile dysfunction) 08/03/2015   OSA on CPAP 08/03/2015   Secondary hyperparathyroidism (HCC) 08/03/2015   Diverticulosis 08/03/2015   Closed fracture of left scapula 11/09/2014   Closed fracture of shaft of left clavicle 11/09/2014   Multiple fractures of ribs of left side 11/09/2014    1. OSA on CPAP (Primary) The patient does tolerate PAP and reports  benefit from PAP use. The patient was reminded how to clean equipment and advised to replace supplies routinely. The patient was also counselled on weight loss. The compliance is excellent. The AHI is 0.8.   OSA on cpap- controlled. Continue with excellent compliance with pap. CPAP continues to be medically necessary to treat this patient's OSA. F/u one year.     2. CPAP use counseling Pt reports good compliance with CPAP therapy. Cleaning machine by hand, and changing filters and tubing as directed. Denies headaches, sinus issues, palpitations, or hemoptysis.     3. Hypertension associated with diabetes (HCC) Elevated  today. He will check it at home. He took his meds late today.     General Counseling: I have discussed the findings of the evaluation and examination with Zoila Hines.  I have also discussed any further diagnostic evaluation thatmay be needed or ordered today. Melva verbalizes understanding of the findings of todays visit. We also reviewed his medications today and discussed drug interactions and side effects including but not limited excessive drowsiness and altered mental states. We also discussed that there is always a risk not just to him but also people around him. he has been encouraged to call the office with any questions or concerns that should arise related to todays visit.  No orders of the defined types were placed in this encounter.       I have personally obtained a history, examined the patient, evaluated laboratory and imaging results, formulated the assessment and plan and placed orders. This patient was seen today by Louann Rous, PA-C in collaboration with Dr. Cam Cava.   Cordie Deters, MD [facility] Diplomate ABMS Pulmonary Critical Care Medicine and Sleep Medicine

## 2024-04-22 ENCOUNTER — Ambulatory Visit (INDEPENDENT_AMBULATORY_CARE_PROVIDER_SITE_OTHER): Admitting: Internal Medicine

## 2024-04-22 VITALS — BP 199/107 | HR 71 | Resp 16 | Ht 73.0 in | Wt 236.0 lb

## 2024-04-22 DIAGNOSIS — I152 Hypertension secondary to endocrine disorders: Secondary | ICD-10-CM | POA: Diagnosis not present

## 2024-04-22 DIAGNOSIS — G4733 Obstructive sleep apnea (adult) (pediatric): Secondary | ICD-10-CM

## 2024-04-22 DIAGNOSIS — E1159 Type 2 diabetes mellitus with other circulatory complications: Secondary | ICD-10-CM

## 2024-04-22 DIAGNOSIS — Z7189 Other specified counseling: Secondary | ICD-10-CM | POA: Diagnosis not present

## 2024-04-22 NOTE — Patient Instructions (Signed)

## 2024-04-23 ENCOUNTER — Other Ambulatory Visit: Payer: Self-pay

## 2024-04-23 ENCOUNTER — Emergency Department
Admission: EM | Admit: 2024-04-23 | Discharge: 2024-04-23 | Disposition: A | Discharge status: Home / Self Care | Attending: Emergency Medicine | Admitting: Emergency Medicine

## 2024-04-23 DIAGNOSIS — I129 Hypertensive chronic kidney disease with stage 1 through stage 4 chronic kidney disease, or unspecified chronic kidney disease: Secondary | ICD-10-CM | POA: Diagnosis present

## 2024-04-23 DIAGNOSIS — E871 Hypo-osmolality and hyponatremia: Secondary | ICD-10-CM | POA: Diagnosis not present

## 2024-04-23 DIAGNOSIS — E1122 Type 2 diabetes mellitus with diabetic chronic kidney disease: Secondary | ICD-10-CM | POA: Diagnosis not present

## 2024-04-23 DIAGNOSIS — N189 Chronic kidney disease, unspecified: Secondary | ICD-10-CM | POA: Insufficient documentation

## 2024-04-23 DIAGNOSIS — E876 Hypokalemia: Secondary | ICD-10-CM | POA: Insufficient documentation

## 2024-04-23 DIAGNOSIS — I10 Essential (primary) hypertension: Secondary | ICD-10-CM

## 2024-04-23 LAB — CBC
HCT: 43.3 % (ref 39.0–52.0)
Hemoglobin: 15.6 g/dL (ref 13.0–17.0)
MCH: 29.1 pg (ref 26.0–34.0)
MCHC: 36 g/dL (ref 30.0–36.0)
MCV: 80.8 fL (ref 80.0–100.0)
Platelets: 177 10*3/uL (ref 150–400)
RBC: 5.36 MIL/uL (ref 4.22–5.81)
RDW: 13 % (ref 11.5–15.5)
WBC: 8.4 10*3/uL (ref 4.0–10.5)
nRBC: 0 % (ref 0.0–0.2)

## 2024-04-23 LAB — BASIC METABOLIC PANEL WITH GFR
Anion gap: 11 (ref 5–15)
BUN: 16 mg/dL (ref 8–23)
CO2: 25 mmol/L (ref 22–32)
Calcium: 9.3 mg/dL (ref 8.9–10.3)
Chloride: 97 mmol/L — ABNORMAL LOW (ref 98–111)
Creatinine, Ser: 1.33 mg/dL — ABNORMAL HIGH (ref 0.61–1.24)
GFR, Estimated: 59 mL/min — ABNORMAL LOW (ref >60)
Glucose, Bld: 281 mg/dL — ABNORMAL HIGH (ref 70–99)
Potassium: 2.9 mmol/L — ABNORMAL LOW (ref 3.5–5.1)
Sodium: 133 mmol/L — ABNORMAL LOW (ref 135–145)

## 2024-04-23 LAB — TROPONIN I (HIGH SENSITIVITY)
Troponin I (High Sensitivity): 7 ng/L (ref <18)
Troponin I (High Sensitivity): 7 ng/L (ref <18)

## 2024-04-23 MED ORDER — POTASSIUM CHLORIDE CRYS ER 20 MEQ PO TBCR
40.0000 meq | EXTENDED_RELEASE_TABLET | Freq: Once | ORAL | Status: AC
  Administered 2024-04-23: 40 meq via ORAL
  Filled 2024-04-23: qty 2

## 2024-04-23 NOTE — ED Provider Notes (Signed)
 [facility] Provider Note    Event Date/Time   First MD Initiated Contact with Patient 04/23/24 1133     (approximate)   History   Hypertension   HPI  Manuel Burnett is a 68 y.o. male with PMH of diabetes, CKD, hypertension, GERD who presents for evaluation of hypertension.  Patient states that his blood pressures been in the 200s over the past few days.  He states he took his blood pressure medication this morning but has not been consistent about taking it over the past few weeks.  Reports that he has also been under an increased amount of stress due to some family circumstances and trying to sell his house.  He denies chest pain, shortness of breath, headache and vision changes.      Physical Exam   Triage Vital Signs: ED Triage Vitals  Encounter Vitals Group     BP 04/23/24 1059 (!) 171/87     Systolic BP Percentile --      Diastolic BP Percentile --      Pulse Rate 04/23/24 1059 72     Resp 04/23/24 1059 18     Temp 04/23/24 1059 (!) 97.5 F (36.4 C)     Temp Source 04/23/24 1059 Oral     SpO2 04/23/24 1059 96 %     Weight 04/23/24 1102 235 lb 14.3 oz (107 kg)     Height 04/23/24 1102 6\' 1"  (1.854 m)     Head Circumference --      Peak Flow --      Pain Score 04/23/24 1101 3     Pain Loc --      Pain Education --      Exclude from Growth Chart --     Most recent vital signs: Vitals:   04/23/24 1300 04/23/24 1315  BP: (!) 184/85 (!) 180/82  Pulse: 61 (!) 57  Resp:    Temp:    SpO2: 97% 97%    General: Awake, no distress.  CV:  Good peripheral perfusion. RRR. Resp:  Normal effort. CTAB. Abd:  No distention.  Other:  Radial pulse and dorsalis pedis pulse 2+ and regular bilaterally.   ED Results / Procedures / Treatments   Labs (all labs ordered are listed, but only abnormal results are displayed) Labs Reviewed  BASIC METABOLIC PANEL WITH GFR - Abnormal; Notable for the following components:      Result Value   Sodium  133 (*)    Potassium 2.9 (*)    Chloride 97 (*)    Glucose, Bld 281 (*)    Creatinine, Ser 1.33 (*)    GFR, Estimated 59 (*)    All other components within normal limits  CBC  TROPONIN I (HIGH SENSITIVITY)  TROPONIN I (HIGH SENSITIVITY)     EKG  ED provider interpretation: Normal sinus rhythm  Vent. rate 70 BPM PR interval 158 ms QRS duration 106 ms QT/QTcB 446/481 ms P-R-T axes 50 116 98   PROCEDURES:  Critical Care performed: No  Procedures   MEDICATIONS ORDERED IN ED: Medications  potassium chloride SA (KLOR-CON M) CR tablet 40 mEq (40 mEq Oral Given 04/23/24 1304)     IMPRESSION / MDM / ASSESSMENT AND PLAN / ED COURSE  I reviewed the triage vital signs and the nursing notes.                             68 year  old male presents for evaluation of hypertension. BP is elevated, VSS otherwise, patient NAD on exam.   Differential diagnosis includes, but is not limited to, asymptomatic hypertension, hypertensive emergency, stroke, ACS.  Patient's presentation is most consistent with acute complicated illness / injury requiring diagnostic workup.  Patient's blood pressure is elevated but he denies any symptoms at this time.  Workup is reassuring.  Given patient is asymptomatic I feel he is stable for outpatient management.  I believe his blood pressure was elevated over the past 2 days due to taking his medication inconsistently early and the added life stressors.  We discussed stress management, taking his medications as prescribed and checking his blood pressure few times a week so he has numbers to report to his primary care provider.  He will follow-up with his primary care provider as needed.  Patient voiced understanding, all questions were answered and he is stable at discharge.  Clinical Course as of 04/23/24 1339  Tue Apr 23, 2024  1334 Basic metabolic panel(!) Mild hypokalemia, patient was given potassium supplement.   Creatinine slightly elevated but at  patient's baseline.  [LD]  1335 CBC Within normal limits. [LD]  1335 Troponin I (High Sensitivity) Not elevated and no chest pain.  [LD]    Clinical Course User Index [LD] Phyliss Breen, PA-C     FINAL CLINICAL IMPRESSION(S) / ED DIAGNOSES   Final diagnoses:  Asymptomatic hypertension     Rx / DC Orders   ED Discharge Orders     None        Note:  This document was prepared using Dragon voice recognition software and may include unintentional dictation errors.   Phyliss Breen, PA-C 04/23/24 1339    Arline Bennett, MD 04/23/24 [phone]

## 2024-04-23 NOTE — ED Triage Notes (Signed)
 Pt here c/o hypertension. Pt states his bp has been elevated lately with his bp being in the 200s. Pt states Sat he had a bit of fogginess with some head pressure. Pt states he has been stressed as well. Pt takes bp medication and has been compliant. Pt denies cp.

## 2024-04-23 NOTE — Discharge Instructions (Signed)
 Take your medications as prescribed. Return to the ED with any worsening symptoms like blurry vision, headache, chest pain or difficulty breathing.   Check your blood pressure a few times a week and write down these numbers for your primary care provider. Schedule a follow up appointment with your primary care as needed.   It was a pleasure to care for you today!

## 2024-04-26 DIAGNOSIS — G459 Transient cerebral ischemic attack, unspecified: Secondary | ICD-10-CM | POA: Insufficient documentation

## 2024-04-27 LAB — BASIC METABOLIC PANEL WITH GFR
BUN: 13 (ref 4–21)
CO2: 28 — AB (ref 13–22)
Chloride: 106 (ref 99–108)
Creatinine: 1 (ref 0.6–1.3)
Glucose: 209
Potassium: 3.6 meq/L (ref 3.5–5.1)
Sodium: 140 (ref 137–147)

## 2024-04-27 LAB — LIPID PANEL
Cholesterol: 259 — AB (ref 0–200)
HDL: 36 (ref 35–70)
LDL Cholesterol: 88
Triglycerides: 509 — AB (ref 40–160)

## 2024-04-27 LAB — COMPREHENSIVE METABOLIC PANEL WITH GFR: Calcium: 9 (ref 8.7–10.7)

## 2024-04-27 LAB — VITAMIN B12: Vitamin B-12: 674

## 2024-04-27 LAB — HEMOGLOBIN A1C: Hemoglobin A1C: 8.1

## 2024-04-27 LAB — VITAMIN D 25 HYDROXY (VIT D DEFICIENCY, FRACTURES): Vit D, 25-Hydroxy: 14

## 2024-04-29 ENCOUNTER — Telehealth: Payer: Self-pay

## 2024-04-29 NOTE — Telephone Encounter (Signed)
 Copied from CRM [id number]. Topic: Clinical - Medical Advice >> Apr 29, 2024 11:06 AM Everlene Hobby D wrote: Shelah Derry down to [facility] Friday and suffered a stroke and was hospitalized and needs a hospital f/u. Says it affected his vision >> Apr 29, 2024 11:06 AM Everlene Hobby D wrote: Would like a call back from his pcp

## 2024-04-29 NOTE — Telephone Encounter (Signed)
 Has hospital follow up scheduled for 05/08/2024 with J. Cannady.

## 2024-05-08 ENCOUNTER — Inpatient Hospital Stay: Admitting: Nurse Practitioner

## 2024-05-09 ENCOUNTER — Ambulatory Visit (INDEPENDENT_AMBULATORY_CARE_PROVIDER_SITE_OTHER): Admitting: Family Medicine

## 2024-05-09 ENCOUNTER — Encounter: Payer: Self-pay | Admitting: Family Medicine

## 2024-05-09 ENCOUNTER — Other Ambulatory Visit: Payer: Self-pay | Admitting: Family Medicine

## 2024-05-09 ENCOUNTER — Other Ambulatory Visit: Payer: Self-pay

## 2024-05-09 VITALS — BP 169/74 | HR 56 | Temp 98.0°F | Ht 73.0 in | Wt 227.2 lb

## 2024-05-09 DIAGNOSIS — I6930 Unspecified sequelae of cerebral infarction: Secondary | ICD-10-CM | POA: Diagnosis not present

## 2024-05-09 DIAGNOSIS — E876 Hypokalemia: Secondary | ICD-10-CM | POA: Diagnosis not present

## 2024-05-09 DIAGNOSIS — E559 Vitamin D deficiency, unspecified: Secondary | ICD-10-CM

## 2024-05-09 DIAGNOSIS — I129 Hypertensive chronic kidney disease with stage 1 through stage 4 chronic kidney disease, or unspecified chronic kidney disease: Secondary | ICD-10-CM

## 2024-05-09 DIAGNOSIS — H539 Unspecified visual disturbance: Secondary | ICD-10-CM

## 2024-05-09 DIAGNOSIS — Z794 Long term (current) use of insulin: Secondary | ICD-10-CM

## 2024-05-09 DIAGNOSIS — E782 Mixed hyperlipidemia: Secondary | ICD-10-CM

## 2024-05-09 DIAGNOSIS — E0840 Diabetes mellitus due to underlying condition with diabetic neuropathy, unspecified: Secondary | ICD-10-CM

## 2024-05-09 DIAGNOSIS — N182 Chronic kidney disease, stage 2 (mild): Secondary | ICD-10-CM

## 2024-05-09 LAB — HM DIABETES EYE EXAM

## 2024-05-09 MED ORDER — HYDRALAZINE HCL 10 MG PO TABS: 10.0000 mg | ORAL_TABLET | Freq: Two times a day (BID) | ORAL | 1 refills | Status: DC

## 2024-05-09 MED ORDER — TRESIBA FLEXTOUCH 100 UNIT/ML ~~LOC~~ SOPN
35.0000 [IU] | PEN_INJECTOR | Freq: Every day | SUBCUTANEOUS | 2 refills | Status: DC
Start: 2024-05-09 — End: 2024-05-27
  Filled 2024-05-09: qty 15, 42d supply, fill #0
  Filled 2024-05-09: qty 3, 8d supply, fill #0
  Filled 2024-05-10 (×2): qty 6, 17d supply, fill #0
  Filled 2024-05-10 (×2): qty 3, 8d supply, fill #0

## 2024-05-09 NOTE — Progress Notes (Signed)
 BP (!) 169/74 (BP Location: Left Arm, Patient Position: Sitting)   Pulse (!) 56   Temp 98 F (36.7 C) (Oral)   Ht 6\' 1"  (1.854 m)   Wt 227 lb 3.2 oz (103.1 kg)   BMI 29.98 kg/m    Subjective:    Patient ID: Manuel Burnett, male    DOB: [birth year], 68 y.o.   MRN: [id number]  HPI: Manuel Burnett is a 68 y.o. male  Chief Complaint  Patient presents with   Hospital Admission    Pt was admitted last Friday for a stroke. Pt is still having some what of vision lost.    Transition of Care Hospital Follow up.   Hospital/Facility: [facility] D/C Physician: Dr. Elvan Hamel D/C Date: 04/28/24  Records Requested: 05/09/24 Records Received: 05/09/24 Records Reviewed: 05/09/24  Diagnoses on Discharge: Acute CVA AMS Hypertensive Encephalopathy QTC Prolongation Hypertensive Emergency Hypokalemia DMII Nicotine Use disorder OSA on CPAP  Date of interactive Contact within 48 hours of discharge: NOT DONE  Date of 7 day or 14 day face-to-face visit:  05/09/24  within 14 days  Outpatient Encounter Medications as of 05/09/2024  Medication Sig Note   albuterol  (PROAIR  HFA) 108 (90 Base) MCG/ACT inhaler Inhale 2 puffs into the lungs every 4 (four) hours as needed for wheezing or shortness of breath.    amLODipine  (NORVASC ) 10 MG tablet Take 1 tablet (10 mg total) by mouth daily.    aspirin 81 MG chewable tablet Chew 81 mg by mouth. 05/09/2024: 81 MG PO DAILY   B-D INTEGRA SYRINGE 22G X 1-1/2" 3 ML MISC Inject testosterone  1x every 2 weeks    B-D ULTRAFINE III SHORT PEN 31G X 8 MM MISC Inject into the skin daily.    BD HYPODERMIC NEEDLE 18G X 1" MISC INJECT TESTOSTERONE  EVERY 2 WEEKS AS DIRECTED    fenofibrate  160 MG tablet Take 1 tablet (160 mg total) by mouth daily. with food    glipiZIDE  (GLUCOTROL ) 5 MG tablet Take 1 tablet (5 mg total) by mouth daily.    glucose blood (ACCU-CHEK GUIDE TEST) test strip Use as instructed    hydrALAZINE (APRESOLINE) 10 MG  tablet Take 1 tablet (10 mg total) by mouth 2 (two) times daily.    hydrochlorothiazide  (HYDRODIURIL ) 25 MG tablet Take 1 tablet (25 mg total) by mouth daily.    Insulin  Pen Needle (PEN NEEDLES 5/16") 30G X 8 MM MISC 1 each by Does not apply route daily.    levothyroxine  (SYNTHROID ) 200 MCG tablet Take 1 tablet (200 mcg total) by mouth daily before breakfast. Take 1 pill a day. To be taken with the 75mcg for 275mcg daily    levothyroxine  (SYNTHROID ) 75 MCG tablet Take 1 tablet (75 mcg total) by mouth daily. Take 1 pill a day. To be taken with the 200mcg for 275mcg daily    losartan  (COZAAR ) 100 MG tablet Take 1 tablet (100 mg total) by mouth daily.    metFORMIN  (GLUCOPHAGE -XR) 500 MG 24 hr tablet TAKE 2 TABLETS(1000 MG) BY MOUTH IN THE MORNING AND AT BEDTIME    Multiple Vitamin (MULTIVITAMIN WITH MINERALS) TABS tablet Take 1 tablet by mouth daily.    pioglitazone  (ACTOS ) 30 MG tablet Take 1 tablet (30 mg total) by mouth daily.    PLAVIX 75 MG tablet Take 75 mg by mouth. 05/09/2024: 75 MG PO DAILY   triamcinolone  cream (KENALOG ) 0.1 % Apply topically 2 (two) times daily.    Vitamin D, Ergocalciferol, (  DRISDOL) 1.25 MG (50000 UNIT) CAPS capsule Take 1 capsule (50,000 Units total) by mouth every 7 (seven) days.    [DISCONTINUED] atorvastatin (LIPITOR) 80 MG tablet Take 80 mg by mouth. 05/09/2024: 80 MG PO BEDTIME   [DISCONTINUED] insulin  degludec (TRESIBA  FLEXTOUCH) 100 UNIT/ML FlexTouch Pen INJECT 30 UNITS UNDER THE SKIN DAILY    atorvastatin (LIPITOR) 80 MG tablet Take 1 tablet (80 mg total) by mouth daily.    insulin  degludec (TRESIBA  FLEXTOUCH) 100 UNIT/ML FlexTouch Pen Inject 35 Units into the skin at bedtime.    testosterone  cypionate (DEPOTESTOSTERONE CYPIONATE) 200 MG/ML injection ADMINISTER 0.5 ML(100 MG) IN THE MUSCLE EVERY 14 DAYS (Patient not taking: Reported on 05/09/2024)    [DISCONTINUED] cyclobenzaprine  (FLEXERIL ) 5 MG tablet Take 1 tablet (5 mg total) by mouth 3 (three) times daily as  needed.    [DISCONTINUED] ketoconazole (NIZORAL) 2 % shampoo SMARTSIG:Topical 2-3 Times Weekly (Patient not taking: Reported on 05/09/2024)    [DISCONTINUED] rosuvastatin  (CRESTOR ) 10 MG tablet TAKE 1 TABLET(10 MG) BY MOUTH DAILY (Patient not taking: Reported on 05/09/2024) 05/09/2024: Hospital Drs stopped medication    No facility-administered encounter medications on file as of 05/09/2024.    Diagnostic Tests Reviewed:  CT HEAD WITHOUT CONTRAST; CTA NECK; CTA HEAD CLINICAL DATA: AMS - Altered Mental Status; Confusion, headaches, left arm numbness to fingertips, LKW 1700 today. COMPARISON: None TECHNIQUE: CT head: Axial images of the brain are acquired without intravenous contrast. CTA neck: Following the administration of intravenous contrast, helically acquired axial images were obtained from the aortic arch through the skull base during the arterial phase. Maximum intensity projections and/or 3D reconstructions were generated by the technologist at the CT scanner. CTA head: Following the administration of intravenous contrast, helically acquired axial images were obtained from the aortic arch through the skull base during the arterial phase. Maximum intensity projections and/or 3D reconstructions were generated by the technologist at the CT scanner. Contrast: 75 mL ISOVUE 370 Weight-based documented intravenous contrast. All CT scans at this facility use dose modulation, iterative reconstruction, and/or weight based dosing when appropriate to reduce radiation dose to as low as reasonably achievable.  IMPRESSION: 1. No acute intracranial abnormality. 2. No cervicoencephalic large-vessel arterial occlusion, dissection, or aneurysm. 3. 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 4. Stenotic right P2 A PCA segment; diminutive remaining posterior circulation. MRI would serve for more definitive assessment of ischemia if clinical suspicion persists.   MRI BRAIN WO  CONTRAST  Result: DIAGNOSTIC IMAGING REPORT [facility] - [address] Darrol Emmer [place], [place] [address] PHONE #: [phone] FAX #: [phone] ------------------------------------------------------------------------------- Name: KEY, CEN: O.1308 B Radiology No:  DOB: 1956-05-17 Age: 72 Sex: M Status: ADM IN Unit No: [id number]  Phys: Merle Starcher Acct: [id number]  Reason For Exam: etails below ? Exam Date: 04/28/2024 -------------------------------------------------------------------------------  EXAMS: Reason for Exam:: [id number] MRI Brain W/O Contrast CBVD - Cerebrovascular Disease  EXAM: MRI BRAIN WITHOUT CONTRAST CLINICAL DATA: CBVD - Cerebrovascular Disease - Patient p/w stroke like symptoms with headache, confusion, blurred vision, and left sided numbness/tingling. COMPARISON: CT head dated 04/26/2024. TECHNIQUE: Multiplanar multi-sequence MR imaging of the head was performed without the administration of intravenous contrast. FINDINGS: Parenchyma: Moderate evolving right posterior cerebral artery vascular territory infarct. No significant mass effect or hemorrhagic transformation. Ventricular system: No hydrocephalus Skull base and calvarium: No acute or aggressive osseous abnormality. Other findings: Normal orbital contents. Mild mucosal thickening in the paranasal sinuses. Clear mastoid air cells. IMPRESSION: Evolving right posterior cerebral  artery vascular territory infarct. No significant mass effect or hemorrhagic transformation.   Portable Chest COUGH - Cough   EXAM:  PORTABLE CHEST - 1 VIEW  CLINICAL DATA:  COUGH - Cough,smoker,hypertension,diabetes,left paresthesias.  alp  COMPARISON:  None  FINDINGS:  Frontal view of the chest obtained.  No consolidation or pleural effusion. No pneumothorax.  Cardiomediastinal silhouette is unremarkable.  IMPRESSION:  No acute radiographic finding in the chest.   Disposition:  Home  Consults: Neurology  Discharge Instructions: Follow up here  Disease/illness Education: Discussed today  Home Health/Community Services Discussions/Referrals: Placed today  Establishment or re-establishment of referral orders for community resources: Placed today  Discussion with other health care providers: N/A  Assessment and Support of treatment regimen adherence: Excellent  Appointments Coordinated with: Patient and wife  Education for self-management, independent living, and ADLs: Discussed today  Since getting out of the hospital, he has been OK. He notes that he continues to have issues with seeing. He notes that sometimes there are blind spots in his vision. He notes that he can't see to his L side of his vision. He has some numbness and tingling with his stroke, but he didn't have any weakness. He notes that his distance is not doing well. He notes that his vision has been coming and going. He notes that otherwise he's doing well.   Relevant past medical, surgical, family and social history reviewed and updated as indicated. Interim medical history since our last visit reviewed. Allergies and medications reviewed and updated.  Review of Systems  Constitutional: Negative.   Eyes:  Positive for visual disturbance. Negative for photophobia, pain, discharge, redness and itching.  Respiratory: Negative.    Cardiovascular: Negative.   Gastrointestinal: Negative.   Musculoskeletal: Negative.   Neurological: Negative.   Hematological: Negative.   Psychiatric/Behavioral: Negative.      Per HPI unless specifically indicated above     Objective:     BP (!) 169/74 (BP Location: Left Arm, Patient Position: Sitting)   Pulse (!) 56   Temp 98 F (36.7 C) (Oral)   Ht 6\' 1"  (1.854 m)   Wt 227 lb 3.2 oz (103.1 kg)   BMI 29.98 kg/m   Wt Readings from Last 3 Encounters:  05/09/24 227 lb 3.2 oz (103.1 kg)  04/23/24 235 lb 14.3 oz (107 kg)  04/22/24 236 lb (107 kg)     Physical Exam Vitals and nursing note reviewed.  Constitutional:      General: He is not in acute distress.    Appearance: Normal appearance. He is not ill-appearing, toxic-appearing or diaphoretic.  HENT:     Head: Normocephalic and atraumatic.     Right Ear: External ear normal.     Left Ear: External ear normal.     Nose: Nose normal.     Mouth/Throat:     Mouth: Mucous membranes are moist.     Pharynx: Oropharynx is clear.  Eyes:     General: No scleral icterus.       Right eye: No discharge.        Left eye: No discharge.     Extraocular Movements: Extraocular movements intact.     Conjunctiva/sclera: Conjunctivae normal.     Pupils: Pupils are equal, round, and reactive to light.  Cardiovascular:     Rate and Rhythm: Normal rate and regular rhythm.     Pulses: Normal pulses.     Heart sounds: Normal heart sounds. No murmur heard.    No friction rub. No  gallop.  Pulmonary:     Effort: Pulmonary effort is normal. No respiratory distress.     Breath sounds: Normal breath sounds. No stridor. No wheezing, rhonchi or rales.  Chest:     Chest wall: No tenderness.  Musculoskeletal:        General: Normal range of motion.     Cervical back: Normal range of motion and neck supple.  Skin:    General: Skin is warm and dry.     Capillary Refill: Capillary refill takes less than 2 seconds.     Coloration: Skin is not jaundiced or pale.     Findings: No bruising, erythema, lesion or rash.  Neurological:     General: No focal deficit present.     Mental Status: He is alert and oriented to person, place, and time. Mental status is at baseline.  Psychiatric:        Mood and Affect: Mood normal.        Behavior: Behavior normal.        Thought Content: Thought content normal.        Judgment: Judgment normal.     BUN/CREATININE RATIO 13 (Normal) Range: 10 - 20     BLOOD UREA NITROGEN 13 mg/dL (Normal) Range: 7 mg/dL - 20 mg/dL    CALCIUM  9.0 mg/dL (Normal) Range: 8.4 mg/dL -  86.5 mg/dL    CHLORIDE 784 mmol/L (Normal) Range: 96 mmol/L - 107 mmol/L    CARBON DIOXIDE 28 mmol/L (Normal) Range: 22 mmol/L - 32 mmol/L    CREATININE 1.00 mg/dL (Normal) Range: 0.7 mg/dL - 1.5 mg/dL    ANION GAP 6.0 meq/L (Normal) Range: 3 meq/L - 11 meq/L    GFR RACE INDIFFERENT 83 Range: >=60 Comments: The eGFR is calculated using the 2021 CKD-EPI Cr equation, which includes serum Cr, age, and sex but does not include a race coefficient. The [employer] recommends this formula for calculation eGFR in adults. GFR will not calculate if sex is unknown or patient age is <18 years.  Ref range: >/=60 mL/min/1.73 m2    GLUCOSE 209 mg/dL (High) Range: 74 mg/dL - 696 mg/dL    POTASSIUM 3.6  (Normal) Range: 3.5  - 5.1     SODIUM 140 mmol/L (Normal) Range: 137 mmol/L - 145 mmol/L    CORRECTED SODIUM 142 mmol/L (Normal) Range: 135 mmol/L - 146 mmol/L  VITAMIN D 25-HYDROXY Ordered On: 27-Apr-2024    27-Apr-2024 11:08 VITAMIN D 25-HYDROXY 14.0 ng/mL (Low) Range: 30 ng/mL - 100 ng/mL Comments: Reference Interval:  Adult: Pediatric: ____________________________ ____________________ Deficiency < 20 ng/mL < 15 ng/mL  Insufficiency 20-29 ng/mL 15 - 19 ng/mL  Sufficiency 30-100 ng/mL 20 - 100 ng/mL  Potential Toxicity > 100 ng/mL > 100 ng/mL ____________________________ ____________________  REVISED 02/12/2020  VITAMIN B12 Ordered On: 27-Apr-2024    27-Apr-2024 11:44 VITAMIN B12 674 pg/mL (Normal) Range: 239 pg/mL - 931 pg/mL   SK) Ordered On: 27-Apr-2024    27-Apr-2024 05:04 CHOLESTEROL 259 mg/dL (High) Range: 50 mg/dL - 295 mg/dL    HDL CHOLESTEROL 36 mg/dL (Normal) Range: 0 mg/dL - 40 mg/dL    TRIGLYCERIDES 284 mg/dL (High) Range: 30 mg/dL - 132 mg/dL    LDL CHOLESTEROL 88 mg/dL (Normal) Range: 0 mg/dL - 440 mg/dL  HEMOGLOBIN N0U LEVEL Ordered On: 27-Apr-2024 Comments: Add On Test? 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 Will increase his tresiba  to 35 units daily and recheck in 2 weeks. Has been having trouble getting it- Rx sent to [facility].       Relevant Medications   aspirin 81 MG chewable tablet   insulin  degludec (TRESIBA  FLEXTOUCH) 100 UNIT/ML FlexTouch Pen   atorvastatin (LIPITOR) 80 MG tablet   Other Relevant Orders   Ambulatory referral to Neurology   AMB Referral [facility]I Care Management     Genitourinary   Benign hypertensive renal disease   Running high. Will start hydralazine 10mg  and recheck in 2 weeks. Call with any concerns.       Chronic kidney disease, stage II (mild)   Rechecking labs today. Await results. Treat as needed.         Other   Hyperlipemia   Agree with 80mg  atorvastatin. Refill sent to his pharmacy.      Relevant Medications   aspirin 81 MG chewable tablet   hydrALAZINE (APRESOLINE) 10 MG tablet   atorvastatin (LIPITOR) 80 MG tablet   History of stroke with residual deficit - Primary   Will get him in with neurology. Seeing his eye doctor today. Continue to monitor. No driving. Call with any concerns.       Relevant Orders   Ambulatory referral to Neurology   AMB Referral VBCI Care Management   Vitamin D deficiency   Rechecking labs today. Await results. Treat as needed.       Other Visit Diagnoses       Visual changes       Due to stroke. Will get him into neurology. Call with any concerns.   Relevant Orders   Ambulatory referral to Neurology   AMB Referral [facility]BCI Care Management     Hypokalemia       Rechecking labs today. Await results. Treat as needed.   Relevant Orders   Basic metabolic panel with GFR (Completed)        Follow up plan: Return in about 2 weeks (around 05/23/2024).  >1 hour spent with patient and his wife today

## 2024-05-10 ENCOUNTER — Other Ambulatory Visit: Payer: Self-pay

## 2024-05-10 ENCOUNTER — Ambulatory Visit: Payer: Self-pay | Admitting: Family Medicine

## 2024-05-10 ENCOUNTER — Encounter: Payer: Self-pay | Admitting: Family Medicine

## 2024-05-10 DIAGNOSIS — E559 Vitamin D deficiency, unspecified: Secondary | ICD-10-CM | POA: Insufficient documentation

## 2024-05-10 DIAGNOSIS — I6930 Unspecified sequelae of cerebral infarction: Secondary | ICD-10-CM | POA: Insufficient documentation

## 2024-05-10 LAB — BASIC METABOLIC PANEL WITH GFR
BUN/Creatinine Ratio: 18 (ref 10–24)
BUN: 19 mg/dL (ref 8–27)
CO2: 25 mmol/L (ref 20–29)
Calcium: 9.5 mg/dL (ref 8.6–10.2)
Chloride: 101 mmol/L (ref 96–106)
Creatinine, Ser: 1.07 mg/dL (ref 0.76–1.27)
Glucose: 172 mg/dL — ABNORMAL HIGH (ref 70–99)
Potassium: 3.7 mmol/L (ref 3.5–5.2)
Sodium: 140 mmol/L (ref 134–144)
eGFR: 77 mL/min/{1.73_m2} (ref >59)

## 2024-05-10 MED ORDER — VITAMIN D (ERGOCALCIFEROL) 1.25 MG (50000 UNIT) PO CAPS: 50000.0000 [IU] | ORAL_CAPSULE | ORAL | 1 refills | Status: DC

## 2024-05-10 MED ORDER — ATORVASTATIN CALCIUM 80 MG PO TABS: 80.0000 mg | ORAL_TABLET | Freq: Every day | ORAL | 1 refills | Status: DC

## 2024-05-10 NOTE — Assessment & Plan Note (Signed)
 Will get him in with neurology. Seeing his eye doctor today. Continue to monitor. No driving. Call with any concerns.

## 2024-05-10 NOTE — Assessment & Plan Note (Signed)
 Up to 8.1 at outside hospital. Will increase his tresiba  to 35 units daily and recheck in 2 weeks. Has been having trouble getting it- Rx sent to [facility].

## 2024-05-10 NOTE — Assessment & Plan Note (Signed)
 Agree with 80mg  atorvastatin. Refill sent to his pharmacy.

## 2024-05-10 NOTE — Assessment & Plan Note (Signed)
 Rechecking labs today. Await results. Treat as needed.

## 2024-05-10 NOTE — Assessment & Plan Note (Signed)
 Running high. Will start hydralazine 10mg  and recheck in 2 weeks. Call with any concerns.

## 2024-05-10 NOTE — Telephone Encounter (Signed)
 Ordered 05/09/24 #60 1 RF (will need re-eval before further RX)  Requested Prescriptions  Refused Prescriptions Disp Refills   hydrALAZINE (APRESOLINE) 10 MG tablet [Pharmacy Med Name: HYDRALAZINE 10 MG TABLETS (ORANGE)] 180 tablet     Sig: TAKE 1 TABLET(10 MG) BY MOUTH TWICE DAILY     Cardiovascular:  Vasodilators Failed - 05/10/2024  2:17 PM      Failed - ANA Screen, Ifa, Serum in normal range and within 360 days    No results found for: "ANA", "ANATITER", "LABANTI"       Failed - Last BP in normal range    BP Readings from Last 1 Encounters:  05/09/24 (!) 169/74         Passed - HCT in normal range and within 360 days    HCT  Date Value Ref Range Status  04/23/2024 43.3 39.0 - 52.0 % Final   Hematocrit  Date Value Ref Range Status  11/09/2023 46.6 37.5 - 51.0 % Final         Passed - HGB in normal range and within 360 days    Hemoglobin  Date Value Ref Range Status  04/23/2024 15.6 13.0 - 17.0 g/dL Final  62/13/0865 78.4 13.0 - 17.7 g/dL Final         Passed - RBC in normal range and within 360 days    RBC  Date Value Ref Range Status  04/23/2024 5.36 4.22 - 5.81 MIL/uL Final         Passed - WBC in normal range and within 360 days    WBC  Date Value Ref Range Status  04/23/2024 8.4 4.0 - 10.5 K/uL Final         Passed - PLT in normal range and within 360 days    Platelets  Date Value Ref Range Status  04/23/2024 177 150 - 400 K/uL Final  11/09/2023 199 150 - 450 x10E3/uL Final         Passed - Valid encounter within last 12 months    Recent Outpatient Visits           Yesterday History of stroke with residual deficit   [facility] Aspinwall, Megan P, DO   3 months ago Benign hypertensive renal disease   [facility] Solomon Dupre, DO       Future Appointments             In 4 days Solomon Dupre, DO [facility]

## 2024-05-14 ENCOUNTER — Telehealth: Payer: Self-pay

## 2024-05-14 ENCOUNTER — Ambulatory Visit: Payer: Medicare Other | Admitting: Family Medicine

## 2024-05-14 NOTE — Progress Notes (Signed)
 Care Guide Pharmacy Note  05/14/2024 Name: Manuel Burnett MRN: [id number] DOB: 1956-02-08  Referred By: Solomon Dupre, DO Reason for referral: Complex Care Management (Outreach to schedule with Pharm d )   Manuel Burnett is a 68 y.o. year old male who is a primary care patient of Solomon Dupre, DO.  Manuel Burnett was referred to the pharmacist for assistance related to: DMII  Successful contact was made with the patient to discuss pharmacy services including being ready for the pharmacist to call at least 5 minutes before the scheduled appointment time and to have medication bottles and any blood pressure readings ready for review. The patient agreed to meet with the pharmacist via telephone visit on (date/time).05/15/2024  Manuel Burnett , RMA     [place]  [facility], [facility] Guide  Direct Dial: [phone]  Website: [email or web address]

## 2024-05-15 ENCOUNTER — Other Ambulatory Visit: Payer: Self-pay

## 2024-05-15 MED ORDER — INSULIN GLARGINE MAX SOLOSTAR 300 UNIT/ML ~~LOC~~ SOPN: 36.0000 [IU] | PEN_INJECTOR | Freq: Every day | SUBCUTANEOUS | 12 refills | Status: DC

## 2024-05-15 NOTE — Progress Notes (Signed)
   05/15/2024  Patient ID: Manuel Burnett, male   DOB: [birth year], 68 y.o.   MRN: [id number]  Attempted to contact patient to inform that generic Toujeo has been sent to [facility] for him to switch to after completing Tresiba  on hand, but I had to leave a voicemail.  Left my direct phone number and requested patient call, so we could also get a follow-up visit scheduled.  Linn Rich, PharmD, DPLA

## 2024-05-15 NOTE — Progress Notes (Signed)
 05/15/2024 Name: Manuel Burnett MRN: [id number] DOB: 1956-11-06  Chief Complaint  Patient presents with   Diabetes Management Plan   Manuel Burnett is a 68 y.o. year old male who presented for a telephone visit.   They were referred to the pharmacist by their PCP for assistance in managing diabetes.   Subjective:  Care Team: Primary Care Provider: Solomon Dupre, DO ; Next Scheduled Visit: 6/9  Medication Access/Adherence  Current Pharmacy:  [id number] - [place], [place] - [address] AT [facility] [address] [place] [address] Phone: [phone] Fax: [phone]  [facility] [id number] [place], [place] - [address] [place] [address] Phone: [phone] Fax: [phone]  [facility] [address] [place] [address] Phone: [phone] Fax: [phone]  -Patient reports affordability concerns with their medications: No  -Patient reports access/transportation concerns to their pharmacy: No  -Patient reports adherence concerns with their medications:  Yes    Diabetes: Current medications: Tresiba  35 units daily, glipizide  5mg  daily, metformin  XR 1000mg  BID, pioglitazone  30mg  daily -Medications tried in the past: Ozempic  and Trulicity  caused skin rash, Jardiance  and Mounjaro  were not affordable  -Not currently checking home BG due to some vision loss secondary to recent stroke -A1c elevated at 8.1% on 5/10, so Tresiba  was increased to 35 units -Patient endorses recent issues obtaining Tresiba  from [facility], so recent order was sent to [facility] outpatient pharmacy where patient was able to get but in 2 separate trips   Hypertension: Current medications: amlodipine  10mg  daily, hydralazine 10mg  BID, hydrochlorothiazide  25mg  daily, losartan  100mg  daily -Patient has a validated, automated, upper arm home BP cuff; but this is currently in his camper  and not accessible -Current blood pressure readings readings: 169/74 at last OV, so Dr. Lincoln Renshaw added hydralazine to medication regimen  Hyperlipidemia/ASCVD Risk Reduction Current lipid lowering medications: atorvastatin 80mg  daily, fenofibrate  160mg  daily Antiplatelet regimen: ASA 81mg  daily, clopidogrel 75mg  daily -Admitted to hospital approximately 2 weeks ago for CVA, and atorvastatin and clopidogrel were started at this time  Objective:  Lab Results  Component Value Date   HGBA1C 8.1 04/27/2024   Lab Results  Component Value Date   CREATININE 1.07 05/09/2024   BUN 19 05/09/2024   NA 140 05/09/2024   K 3.7 05/09/2024   CL 101 05/09/2024   CO2 25 05/09/2024   Lab Results  Component Value Date   CHOL 259 (A) 04/27/2024   HDL 36 04/27/2024   LDLCALC 88 04/27/2024   TRIG 509 (A) 04/27/2024   Medications Reviewed Today     Reviewed by Linn Rich, RPH (Pharmacist) on 05/15/24 at 0813  Med List Status: <None>   Medication Order Taking? Sig Documenting Provider Last Dose Status Informant  albuterol  (PROAIR  HFA) 108 (90 Base) MCG/ACT inhaler [id number] Yes Inhale 2 puffs into the lungs every 4 (four) hours as needed for wheezing or shortness of breath. Terre Ferri P, DO Taking Active   amLODipine  (NORVASC ) 10 MG tablet [id number] Yes Take 1 tablet (10 mg total) by mouth daily. Terre Ferri P, DO Taking Active   aspirin 81 MG chewable tablet [id number] Yes Chew 81 mg by mouth. [provider] Taking Active            Med Note (   Thu May 09, 2024 10:46 AM) 81 MG PO DAILY  atorvastatin (LIPITOR) 80 MG  tablet [id number] Yes Take 1 tablet (80 mg total) by mouth daily. Johnson, Megan P, DO Taking Active   B-D INTEGRA SYRINGE 22G X 1-1/2" 3 ML MISC [id number] No Inject testosterone  1x every 2 weeks Johnson, Megan P, DO Unknown Active   B-D ULTRAFINE III SHORT PEN 31G X 8 MM MISC [id number] Yes Inject into the skin daily. [provider] Taking Active   BD  HYPODERMIC NEEDLE 18G X 1" MISC [id number] No INJECT TESTOSTERONE  EVERY 2 WEEKS AS DIRECTED Johnson, Megan P, DO Unknown Active   fenofibrate  160 MG tablet [id number] Yes Take 1 tablet (160 mg total) by mouth daily. with food Terre Ferri P, DO Taking Active   glipiZIDE  (GLUCOTROL ) 5 MG tablet [id number] Yes Take 1 tablet (5 mg total) by mouth daily. Lincoln Renshaw, Megan P, DO Taking Active   glucose blood (ACCU-CHEK GUIDE TEST) test strip [id number] Yes Use as instructed Terre Ferri P, DO Taking Active   hydrALAZINE  (APRESOLINE ) 10 MG tablet [id number] Yes Take 1 tablet (10 mg total) by mouth 2 (two) times daily. Terre Ferri P, DO Taking Active   hydrochlorothiazide  (HYDRODIURIL ) 25 MG tablet [id number] Yes Take 1 tablet (25 mg total) by mouth daily. Terre Ferri P, DO Taking Active   insulin  degludec (TRESIBA  FLEXTOUCH) 100 UNIT/ML FlexTouch Pen [id number] Yes Inject 35 Units into the skin at bedtime. Terre Ferri P, DO Taking Active   Insulin  Pen Needle (PEN NEEDLES 5/16") 30G X 8 MM MISC [id number] Yes 1 each by Does not apply route daily. Terre Ferri P, DO Taking Active   levothyroxine  (SYNTHROID ) 200 MCG tablet [id number] Yes Take 1 tablet (200 mcg total) by mouth daily before breakfast. Take 1 pill a day. To be taken with the 75mcg for 275mcg daily Solomon Dupre, DO Taking Active   levothyroxine  (SYNTHROID ) 75 MCG tablet [id number] Yes Take 1 tablet (75 mcg total) by mouth daily. Take 1 pill a day. To be taken with the 200mcg for 275mcg daily Lincoln Renshaw, Megan P, DO Taking Active   losartan  (COZAAR ) 100 MG tablet [id number] Yes Take 1 tablet (100 mg total) by mouth daily. Lincoln Renshaw, Megan P, DO Taking Active   metFORMIN  (GLUCOPHAGE -XR) 500 MG 24 hr tablet [id number] Yes TAKE 2 TABLETS(1000 MG) BY MOUTH IN THE MORNING AND AT BEDTIME Johnson, Megan P, DO Taking Active   Multiple Vitamin (MULTIVITAMIN WITH MINERALS) TABS tablet [id number] No Take 1 tablet by mouth daily. [provider] Unknown  Active Self  pioglitazone  (ACTOS ) 30 MG tablet [id number] Yes Take 1 tablet (30 mg total) by mouth daily. Terre Ferri P, DO Taking Active   PLAVIX 75 MG tablet [id number] Yes Take 75 mg by mouth. [provider] Taking Active            Med Note (   Thu May 09, 2024 10:46 AM) 75 MG PO DAILY  testosterone  cypionate (DEPOTESTOSTERONE CYPIONATE) 200 MG/ML injection [id number]  ADMINISTER 0.5 ML(100 MG) IN THE MUSCLE EVERY 14 DAYS  Patient not taking: Reported on 05/09/2024   Terre Ferri P, DO  Active   triamcinolone  cream (KENALOG ) 0.1 % [id number] No Apply topically 2 (two) times daily. [provider] Unknown Active   Vitamin D , Ergocalciferol , (DRISDOL ) 1.25 MG (50000 UNIT) CAPS capsule [id number] No Take 1 capsule (50,000 Units total) by mouth every 7 (seven) days. Terre Ferri P, DO Unknown Active            Assessment/Plan:   Diabetes: -Currently controlled -Tresiba  is currently on manufacturer backorder per  [facility]'s list of current drug shortages -Patient's insurance will cover generic for Toujeo Max 300 units/mL, and [facility] endorses they are able to get this medication in consistently.  These pens can only dial in 2 unit increments, so I recommend patient being using glargine max 300 units/mL 36 units daily -Have wife assist with checking at least FBG daily as able -Due for A1c again in August  Hypertension: -Currently uncontrolled -Recommend to obtain BP monitor to check and record daily  -Continue current medication regimen; if BP consistently >130/80, could consider increasing dose of hydralazine  Hyperlipidemia/ASCVD Risk Reduction: -Currently uncontrolled.  -Continue current regimen -Recommend follow-up lipid panel and LFTs in 10 weeks  Follow Up Plan: Will inform patient of insulin  change and schedule telephone follow-up in 4 weeks  Linn Rich, PharmD, DPLA

## 2024-05-21 ENCOUNTER — Other Ambulatory Visit: Payer: Self-pay

## 2024-05-23 ENCOUNTER — Telehealth: Payer: Self-pay

## 2024-05-23 ENCOUNTER — Other Ambulatory Visit

## 2024-05-23 NOTE — Progress Notes (Signed)
 ERROR

## 2024-05-23 NOTE — Progress Notes (Signed)
   05/23/2024  Patient ID: Manuel Burnett, male   DOB: 15-Oct-1956, 68 y.o.   MRN: [id number]  Patient outreach to follow-up on management of DM and HTN.  Patient was out to lunch, so we have scheduled a telephone visit tomorrow morning.  Linn Rich, PharmD, DPLA

## 2024-05-24 ENCOUNTER — Other Ambulatory Visit: Payer: Self-pay

## 2024-05-24 NOTE — Progress Notes (Signed)
   05/24/2024  Patient ID: Manuel Burnett, male   DOB: 06-20-1956, 68 y.o.   MRN: [id number]  Subjective/Objective  Diabetes: Current medications: Tresiba  35 units daily, glipizide  5mg  daily, metformin  XR 1000mg  BID, pioglitazone  30mg  daily -Medications tried in the past: Ozempic  and Trulicity  caused skin rash, Jardiance  and Mounjaro  were not affordable  -Patient has now been checking home BG and reports FBG usually in the 170's -A1c elevated at 8.1% on 5/10, so Tresiba  was increased to 35 units -Patient states he was able to get Tresiba  from [facility] for $35, and generic Toujeo  Max was going to be $100 at [facility]- prefers to continue getting Tresiba  from [facility] as long as availability is consistent   Hypertension: Current medications: amlodipine  10mg  daily, hydralazine  10mg  BID, hydrochlorothiazide  25mg  daily, losartan  100mg  daily -Patient has a validated, automated, upper arm home BP cuff; but he has not been monitoring BP regularly  -Current blood pressure readings readings: 169/74 at last OV, so Dr. Lincoln Renshaw added hydralazine  to medication regimen   Hyperlipidemia/ASCVD Risk Reduction Current lipid lowering medications: atorvastatin  80mg  daily, fenofibrate  160mg  daily Antiplatelet regimen: ASA 81mg  daily, clopidogrel 75mg  daily -Recent CVA in early May -Patient has stopped smoking as of 5/9- quit cold [place], no using any nicotine replacement at this time  Assessment/Plan:    Diabetes: -Currently uncontrolled -Continue to monitor FBG daily -Consider Tresiba  titration of 2 units every 4 days until FBG </=130 -Due for A1c again in August   Hypertension: -Currently uncontrolled -Instructed patient to monitor and record home BP daily approximately 1 hour after taking morning medications and bring readings to upcoming visit with Dr. Lincoln Renshaw -Continue current medication regimen; if BP consistently >130/80, could consider increasing dose of hydralazine    Hyperlipidemia/ASCVD Risk  Reduction: -Currently uncontrolled.  -Continue current regimen -Recommend follow-up lipid panel and LFTs in August   Follow Up Plan: 1 month   Linn Rich, PharmD, DPLA

## 2024-05-27 ENCOUNTER — Encounter: Payer: Self-pay | Admitting: Family Medicine

## 2024-05-27 ENCOUNTER — Other Ambulatory Visit: Payer: Self-pay

## 2024-05-27 ENCOUNTER — Ambulatory Visit (INDEPENDENT_AMBULATORY_CARE_PROVIDER_SITE_OTHER): Admitting: Family Medicine

## 2024-05-27 VITALS — BP 160/73 | HR 64 | Ht 73.0 in | Wt 232.2 lb

## 2024-05-27 DIAGNOSIS — Z794 Long term (current) use of insulin: Secondary | ICD-10-CM | POA: Diagnosis not present

## 2024-05-27 DIAGNOSIS — I129 Hypertensive chronic kidney disease with stage 1 through stage 4 chronic kidney disease, or unspecified chronic kidney disease: Secondary | ICD-10-CM | POA: Diagnosis not present

## 2024-05-27 DIAGNOSIS — E0840 Diabetes mellitus due to underlying condition with diabetic neuropathy, unspecified: Secondary | ICD-10-CM | POA: Diagnosis not present

## 2024-05-27 MED ORDER — HYDRALAZINE HCL 25 MG PO TABS: 25.0000 mg | ORAL_TABLET | Freq: Two times a day (BID) | ORAL | 3 refills | Status: DC

## 2024-05-27 MED ORDER — TRESIBA FLEXTOUCH 100 UNIT/ML ~~LOC~~ SOPN
38.0000 [IU] | PEN_INJECTOR | Freq: Every day | SUBCUTANEOUS | 2 refills | Status: DC
  Filled 2024-05-27 – 2024-06-11 (×3): qty 15, 39d supply, fill #0

## 2024-05-27 NOTE — Assessment & Plan Note (Signed)
 Still running high. Will increase his hydralazine  to 25mg  BID and recheck in 3 weeks. Call with any concerns.

## 2024-05-27 NOTE — Patient Instructions (Signed)
 Take 2 of the hyralazine 2x a day until you use what you have- then start the 25mg  I sent in today

## 2024-05-27 NOTE — Assessment & Plan Note (Signed)
 Fasting sugar still in the 150s- will increase his tresiba  to 38 units daily and recheck at the end of the week, if still running high will titrate up. Call with any concerns. Recheck in about 3 weeks.

## 2024-05-27 NOTE — Progress Notes (Signed)
 BP (!) 160/73 (BP Location: Left Arm, Patient Position: Sitting, Cuff Size: Large)   Pulse 64   Ht 6\' 1"  (1.854 m)   Wt 232 lb 3.2 oz (105.3 kg)   SpO2 95%   BMI 30.64 kg/m    Subjective:    Patient ID: Manuel Burnett, male    DOB: 1956/03/18, 68 y.o.   MRN: [id number]  HPI: Manuel Burnett is a 68 y.o. male  Chief Complaint  Patient presents with   Diabetes    BGL 151 @715am  BP 151/85 @ 815   HYPERTENSION  Hypertension status: uncontrolled  Satisfied with current treatment? no Duration of hypertension: chronic BP monitoring frequency:  daily BP range: 150s BP medication side effects:  no Medication compliance: excellent compliance Previous BP meds: hydralazine , amlodipine , hydrochlorothiazide , losartan  Aspirin: no Recurrent headaches: no Visual changes: no Palpitations: no Dyspnea: no Chest pain: no Lower extremity edema: no Dizzy/lightheaded: no  DIABETES Hypoglycemic episodes:no Polydipsia/polyuria: no Visual disturbance: yes Chest pain: no Paresthesias: no Glucose Monitoring: yes  Accucheck frequency: Daily  Fasting glucose: 150s Taking Insulin ?: yes  Long acting insulin : 35 units Blood Pressure Monitoring: daily Retinal Examination: Up to Date Foot Exam: Up to Date Diabetic Education: Completed Pneumovax: Up to Date Influenza: Not up to Date Aspirin: yes  Relevant past medical, surgical, family and social history reviewed and updated as indicated. Interim medical history since our last visit reviewed. Allergies and medications reviewed and updated.  Review of Systems  Constitutional: Negative.   Eyes:  Positive for visual disturbance. Negative for photophobia, pain, discharge, redness and itching.  Respiratory: Negative.    Cardiovascular: Negative.   Musculoskeletal: Negative.   Skin: Negative.   Psychiatric/Behavioral: Negative.      Per HPI unless specifically indicated above     Objective:     BP (!) 160/73 (BP Location: Left  Arm, Patient Position: Sitting, Cuff Size: Large)   Pulse 64   Ht 6\' 1"  (1.854 m)   Wt 232 lb 3.2 oz (105.3 kg)   SpO2 95%   BMI 30.64 kg/m   Wt Readings from Last 3 Encounters:  05/27/24 232 lb 3.2 oz (105.3 kg)  05/09/24 227 lb 3.2 oz (103.1 kg)  04/23/24 235 lb 14.3 oz (107 kg)    Physical Exam Vitals and nursing note reviewed.  Constitutional:      General: He is not in acute distress.    Appearance: Normal appearance. He is obese. He is not ill-appearing, toxic-appearing or diaphoretic.  HENT:     Head: Normocephalic and atraumatic.     Right Ear: External ear normal.     Left Ear: External ear normal.     Nose: Nose normal.     Mouth/Throat:     Mouth: Mucous membranes are moist.     Pharynx: Oropharynx is clear.  Eyes:     General: No scleral icterus.       Right eye: No discharge.        Left eye: No discharge.     Extraocular Movements: Extraocular movements intact.     Conjunctiva/sclera: Conjunctivae normal.     Pupils: Pupils are equal, round, and reactive to light.  Cardiovascular:     Rate and Rhythm: Normal rate and regular rhythm.     Pulses: Normal pulses.     Heart sounds: Normal heart sounds. No murmur heard.    No friction rub. No gallop.  Pulmonary:     Effort: Pulmonary effort is normal. No respiratory distress.  Breath sounds: Normal breath sounds. No stridor. No wheezing, rhonchi or rales.  Chest:     Chest wall: No tenderness.  Musculoskeletal:        General: Normal range of motion.     Cervical back: Normal range of motion and neck supple.  Skin:    General: Skin is warm and dry.     Capillary Refill: Capillary refill takes less than 2 seconds.     Coloration: Skin is not jaundiced or pale.     Findings: No bruising, erythema, lesion or rash.  Neurological:     General: No focal deficit present.     Mental Status: He is alert and oriented to person, place, and time. Mental status is at baseline.  Psychiatric:        Mood and Affect:  Mood normal.        Behavior: Behavior normal.        Thought Content: Thought content normal.        Judgment: Judgment normal.     Results for orders placed or performed in visit on 05/09/24  VITAMIN D  25 Hydroxy (Vit-D Deficiency, Fractures)   Collection Time: 04/27/24 12:00 AM  Result Value Ref Range   Vit D, 25-Hydroxy 14.0   Lipid panel   Collection Time: 04/27/24 12:00 AM  Result Value Ref Range   Triglycerides 509 (A) 40 - 160   Cholesterol 259 (A) 0 - 200   HDL 36 35 - 70   LDL Cholesterol 88   Vitamin B12   Collection Time: 04/27/24 12:00 AM  Result Value Ref Range   Vitamin B-12 674   Hemoglobin A1c   Collection Time: 04/27/24 12:00 AM  Result Value Ref Range   Hemoglobin A1C 8.1   Basic metabolic panel with GFR   Collection Time: 04/27/24 12:00 AM  Result Value Ref Range   Glucose 209    BUN 13 4 - 21   CO2 28 (A) 13 - 22   Creatinine 1.0 0.6 - 1.3   Potassium 3.6 3.5 - 5.1 mEq/L   Sodium 140 137 - 147   Chloride 106 99 - 108  Comprehensive metabolic panel with GFR   Collection Time: 04/27/24 12:00 AM  Result Value Ref Range   Calcium  9.0 8.7 - 10.7  Basic metabolic panel with GFR   Collection Time: 05/09/24 11:28 AM  Result Value Ref Range   Glucose 172 (H) 70 - 99 mg/dL   BUN 19 8 - 27 mg/dL   Creatinine, Ser 1.61 0.76 - 1.27 mg/dL   eGFR 77 >09 UE/AVW/0.98   BUN/Creatinine Ratio 18 10 - 24   Sodium 140 134 - 144 mmol/L   Potassium 3.7 3.5 - 5.2 mmol/L   Chloride 101 96 - 106 mmol/L   CO2 25 20 - 29 mmol/L   Calcium  9.5 8.6 - 10.2 mg/dL      Assessment & Plan:   Problem List Items Addressed This Visit       Endocrine   Diabetes mellitus due to underlying condition with diabetic neuropathy (HCC)   Fasting sugar still in the 150s- will increase his tresiba  to 38 units daily and recheck at the end of the week, if still running high will titrate up. Call with any concerns. Recheck in about 3 weeks.       Relevant Medications   insulin   degludec (TRESIBA  FLEXTOUCH) 100 UNIT/ML FlexTouch Pen     Genitourinary   Benign hypertensive renal disease - Primary  Still running high. Will increase his hydralazine  to 25mg  BID and recheck in 3 weeks. Call with any concerns.         Follow up plan: Return in about 3 weeks (around 06/17/2024) for OK to use same day if needed.

## 2024-05-31 ENCOUNTER — Emergency Department

## 2024-05-31 ENCOUNTER — Other Ambulatory Visit: Payer: Self-pay

## 2024-05-31 ENCOUNTER — Emergency Department
Admission: EM | Admit: 2024-05-31 | Discharge: 2024-05-31 | Disposition: A | Discharge status: Home / Self Care | Attending: Emergency Medicine | Admitting: Emergency Medicine

## 2024-05-31 DIAGNOSIS — Z7982 Long term (current) use of aspirin: Secondary | ICD-10-CM | POA: Diagnosis not present

## 2024-05-31 DIAGNOSIS — Z79899 Other long term (current) drug therapy: Secondary | ICD-10-CM | POA: Diagnosis not present

## 2024-05-31 DIAGNOSIS — R202 Paresthesia of skin: Secondary | ICD-10-CM | POA: Diagnosis present

## 2024-05-31 DIAGNOSIS — Z7901 Long term (current) use of anticoagulants: Secondary | ICD-10-CM | POA: Insufficient documentation

## 2024-05-31 DIAGNOSIS — N189 Chronic kidney disease, unspecified: Secondary | ICD-10-CM | POA: Insufficient documentation

## 2024-05-31 DIAGNOSIS — E1122 Type 2 diabetes mellitus with diabetic chronic kidney disease: Secondary | ICD-10-CM | POA: Insufficient documentation

## 2024-05-31 DIAGNOSIS — I129 Hypertensive chronic kidney disease with stage 1 through stage 4 chronic kidney disease, or unspecified chronic kidney disease: Secondary | ICD-10-CM | POA: Diagnosis not present

## 2024-05-31 LAB — URINALYSIS, ROUTINE W REFLEX MICROSCOPIC
Bacteria, UA: NONE SEEN
Bilirubin Urine: NEGATIVE
Glucose, UA: >=500 mg/dL — AB
Hgb urine dipstick: NEGATIVE
Ketones, ur: NEGATIVE mg/dL
Leukocytes,Ua: NEGATIVE
Nitrite: NEGATIVE
Protein, ur: 100 mg/dL — AB
Specific Gravity, Urine: 1.01 (ref 1.005–1.030)
Squamous Epithelial / HPF: 0 /HPF (ref 0–5)
pH: 6 (ref 5.0–8.0)

## 2024-05-31 LAB — COMPREHENSIVE METABOLIC PANEL WITH GFR
ALT: 19 U/L (ref 0–44)
AST: 19 U/L (ref 15–41)
Albumin: 3.9 g/dL (ref 3.5–5.0)
Alkaline Phosphatase: 38 U/L (ref 38–126)
Anion gap: 9 (ref 5–15)
BUN: 26 mg/dL — ABNORMAL HIGH (ref 8–23)
CO2: 26 mmol/L (ref 22–32)
Calcium: 9.3 mg/dL (ref 8.9–10.3)
Chloride: 102 mmol/L (ref 98–111)
Creatinine, Ser: 1.29 mg/dL — ABNORMAL HIGH (ref 0.61–1.24)
GFR, Estimated: >60 mL/min (ref >60)
Glucose, Bld: 183 mg/dL — ABNORMAL HIGH (ref 70–99)
Potassium: 3.6 mmol/L (ref 3.5–5.1)
Sodium: 137 mmol/L (ref 135–145)
Total Bilirubin: 0.9 mg/dL (ref 0.0–1.2)
Total Protein: 6.8 g/dL (ref 6.5–8.1)

## 2024-05-31 LAB — CBC WITH DIFFERENTIAL/PLATELET
Abs Immature Granulocytes: 0.03 10*3/uL (ref 0.00–0.07)
Basophils Absolute: 0 10*3/uL (ref 0.0–0.1)
Basophils Relative: 1 %
Eosinophils Absolute: 0.3 10*3/uL (ref 0.0–0.5)
Eosinophils Relative: 4 %
HCT: 38.2 % — ABNORMAL LOW (ref 39.0–52.0)
Hemoglobin: 13.5 g/dL (ref 13.0–17.0)
Immature Granulocytes: 0 %
Lymphocytes Relative: 22 %
Lymphs Abs: 1.5 10*3/uL (ref 0.7–4.0)
MCH: 29.3 pg (ref 26.0–34.0)
MCHC: 35.3 g/dL (ref 30.0–36.0)
MCV: 82.9 fL (ref 80.0–100.0)
Monocytes Absolute: 0.8 10*3/uL (ref 0.1–1.0)
Monocytes Relative: 12 %
Neutro Abs: 4.2 10*3/uL (ref 1.7–7.7)
Neutrophils Relative %: 61 %
Platelets: 178 10*3/uL (ref 150–400)
RBC: 4.61 MIL/uL (ref 4.22–5.81)
RDW: 12.7 % (ref 11.5–15.5)
WBC: 6.8 10*3/uL (ref 4.0–10.5)
nRBC: 0 % (ref 0.0–0.2)

## 2024-05-31 LAB — URINE DRUG SCREEN, QUALITATIVE (ARMC ONLY)
Amphetamines, Ur Screen: NOT DETECTED
Barbiturates, Ur Screen: NOT DETECTED
Benzodiazepine, Ur Scrn: NOT DETECTED
Cannabinoid 50 Ng, Ur ~~LOC~~: NOT DETECTED
Cocaine Metabolite,Ur ~~LOC~~: NOT DETECTED
MDMA (Ecstasy)Ur Screen: NOT DETECTED
Methadone Scn, Ur: NOT DETECTED
Opiate, Ur Screen: NOT DETECTED
Phencyclidine (PCP) Ur S: NOT DETECTED
Tricyclic, Ur Screen: NOT DETECTED

## 2024-05-31 LAB — PROTIME-INR
INR: 1 (ref 0.8–1.2)
Prothrombin Time: 13.4 s (ref 11.4–15.2)

## 2024-05-31 LAB — ETHANOL: Alcohol, Ethyl (B): <15 mg/dL (ref <15)

## 2024-05-31 LAB — APTT: aPTT: 32 s (ref 24–36)

## 2024-05-31 MED ORDER — SODIUM CHLORIDE 0.9 % IV BOLUS: 500.0000 mL | Freq: Once | INTRAVENOUS | Status: AC

## 2024-05-31 MED ORDER — LORAZEPAM 2 MG/ML IJ SOLN
1.0000 mg | Freq: Once | INTRAMUSCULAR | Status: AC
  Administered 2024-05-31: 1 mg via INTRAVENOUS
  Filled 2024-05-31: qty 1

## 2024-05-31 NOTE — ED Triage Notes (Signed)
 Pt to ED via POV for c/o left arm numbness. Pt states hx stroke in May that affected left side.

## 2024-05-31 NOTE — ED Provider Notes (Signed)
 [facility] Provider Note    Event Date/Time   First MD Initiated Contact with Patient 05/31/24 1337     (approximate)   History   Numbness   HPI  Manuel Burnett is a 68 y.o. male with history of diabetes, CKD, hypertension, hyperlipidemia who comes in with concerns for left arm tingling.  Patient reports being on blood thinner.  On review of records it looks like patient is on Plavix he also reports taking aspirin.  He reports having a prior stroke where he had left arm tingling and he reports the same symptoms today.  Patient reports that he woke up a little bit before 6 and felt normal and then around 7:00 he developed left arm tingling.  He denies any neck pain, chiropractor working on his neck, weakness, any other concerns.  He reports having a prior stroke back in May where he had the same exact symptom and was treated with aspirin, Plavix.  He reports having no long-term residual deficits until today he developed the symptoms again.  He noticed his blood pressure was a little bit elevated which is what prompted him to come into the emergency room.  He denies any chest pain, shortness of breath or other concerning symptoms  Discussed with patient I do not see any records of him having a stroke he said that I was actually in [facility].    I reviewed the primary doctor no from 05/27/2024 where patient was started on hydralazine  25 mg twice daily    Physical Exam   Triage Vital Signs: ED Triage Vitals  Encounter Vitals Group     BP 05/31/24 1258 (!) 179/92     Girls Systolic BP Percentile --      Girls Diastolic BP Percentile --      Boys Systolic BP Percentile --      Boys Diastolic BP Percentile --      Pulse Rate 05/31/24 1258 69     Resp 05/31/24 1258 18     Temp 05/31/24 1258 98.1 F (36.7 C)     Temp Source 05/31/24 1258 Oral     SpO2 05/31/24 1258 98 %     Weight 05/31/24 1303 232 lb 3.2 oz (105.3 kg)     Height 05/31/24 1303 6' 1  (1.854 m)     Head Circumference --      Peak Flow --      Pain Score --      Pain Loc --      Pain Education --      Exclude from Growth Chart --     Most recent vital signs: Vitals:   05/31/24 1258  BP: (!) 179/92  Pulse: 69  Resp: 18  Temp: 98.1 F (36.7 C)  SpO2: 98%     General: Awake, no distress.  CV:  Good peripheral perfusion.  No murmur Resp:  Normal effort.  Clear lungs Abd:  No distention.  Abdomen soft and nontender Other:  Some sensation changes on the left arm.  Good distal pulses no swelling of the arm.  No weakness.  Cranial nerves are otherwise intact.  Equal strength in arms and legs.  No C-spine tenderness.   ED Results / Procedures / Treatments   Labs (all labs ordered are listed, but only abnormal results are displayed) Labs Reviewed  COMPREHENSIVE METABOLIC PANEL WITH GFR - Abnormal; Notable for the following components:      Result Value   Glucose, Bld 183 (*)  BUN 26 (*)    Creatinine, Ser 1.29 (*)    All other components within normal limits  CBC WITH DIFFERENTIAL/PLATELET - Abnormal; Notable for the following components:   HCT 38.2 (*)    All other components within normal limits  ETHANOL  PROTIME-INR  APTT  URINE DRUG SCREEN, QUALITATIVE ([facility] ONLY)     EKG  My interpretation of EKG:  Normal sinus rate of 61 without any ST elevation or T wave inversions, QTc of 43  RADIOLOGY I have reviewed the ct personally and interpreted old prior stroke   PROCEDURES:  Critical Care performed: No  .1-3 Lead EKG Interpretation  Performed by: Lubertha Rush, MD Authorized by: Lubertha Rush, MD     Interpretation: normal     ECG rate:  60   ECG rate assessment: normal     Rhythm: sinus rhythm     Ectopy: none     Conduction: normal      MEDICATIONS ORDERED IN ED: Medications - No data to display   IMPRESSION / MDM / ASSESSMENT AND PLAN / ED COURSE  I reviewed the triage vital signs and the nursing notes.   Patient's  presentation is most consistent with acute presentation with potential threat to life or bodily function.   Patient comes in with concerns for left arm tingling similar to when he had a stroke back in May has been compliant with medications.  Blood pressure slightly elevated but just was recently put on hydralazine  twice daily.  Blood work ordered evaluate for [employer] abnormalities, AKI.  No chest pain to suggest dissection chest x-ray ordered to ensure no widened mediastinum but vitals otherwise are reassuring.  CT head ordered to rule out intercranial hemorrhage MRI to rule out stroke.  CBC is reassuring.  Alcohol negative coags normal CMP shows slightly elevated creatinine we will give some gentle hydration.  CT head without evidence of intercranial hemorrhage still pending CT read but will get MRI to rule out stroke.  Patient be handed off to oncoming team pending MRI     The patient is on the cardiac monitor to evaluate for evidence of arrhythmia and/or significant heart rate changes.      FINAL CLINICAL IMPRESSION(S) / ED DIAGNOSES   Final diagnoses:  Left arm numbness     Rx / DC Orders   ED Discharge Orders     None        Note:  This document was prepared using Dragon voice recognition software and may include unintentional dictation errors.   Lubertha Rush, MD 05/31/24 [phone]

## 2024-05-31 NOTE — ED Provider Notes (Signed)
 Care of this patient assumed from prior provider at 1500 pending MRI, and disposition.  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  CT head here demonstrated subacute stroke, no acute hemorrhage. MRI brain demonstrated a moderately large late subacute PCA infarct with petechial hemorrhage as well as possible more recent small early subacute infarct in the right periatrial white matter.  Patient reassessed at bedside.  He reports he still feels a little off from his ativan for MRI.  He clarifies that the numbness in his arm was actually pins and needle sensation and does think that this is improved.  He reports the tingling was similar to what he experienced in May, but it was more severe at that time.  He denies any new deficits.  Reports he was noted to have partial vision loss at that time for which she has been seeing Dr. Merrell Abate with ophthalmology, no new visual deficits that he has noticed.  [facility], he reports that he had an ultrasound of his heart performed as well as CT and MRI.  He was placed on baby aspirin and Plavix.  On my exam, patient is awake, interactive.  Keenly aware, correctly answers month and age, able to blink eyes and squeeze hands, normal horizontal extraocular movements, partial vision loss of his left upper visual field, not new. Normal facial symmetry, no arm or leg motor drift, no limb ataxia, normal sensation, no aphasia, no dysarthria, no inattention. NIH 1  I reviewed his PCP visit from 05/09/2024 which does include diagnostic tests performed at [facility], reports copied below.  At that time plan was made for patient to follow-up with neurology.  Will discuss case with neurology given radiology report of possible more recent infarct.  Clinical Course as of 05/31/24 1745  Fri May 31, 2024  1654 Case  discussed with Dr. Alecia Ames with neurology.  He will review the case and provide further recommendations. [NR]  1710 Callback from Dr. Alecia Ames.  He reviewed the patient's imaging and suspects that the patient does not have an acute infarct given appearance on imaging and clinical history.  Given recent workup, he does feel that the patient is stable for discharge with continuation of his statin, Plavix, and aspirin.  Patient reassessed and updated on recommendations.  He is comfortable with discharge and outpatient follow-up with neurology.  Strict return precautions provided.  Patient discharged in stable condition. [NR]    Clinical Course User Index [NR] Claria Crofts, MD  .  CT HEAD WITHOUT CONTRAST; CTA NECK; CTA HEAD CLINICAL DATA: AMS - Altered Mental Status; Confusion, headaches, left arm numbness to fingertips, LKW 1700 today. COMPARISON: None TECHNIQUE: CT head: Axial images of the brain are acquired without intravenous contrast. CTA neck: Following the administration of intravenous contrast, helically acquired axial images were obtained from the aortic arch through the skull base during the arterial phase. Maximum intensity projections and/or 3D reconstructions were generated by the technologist at the CT scanner. CTA head: Following the administration of intravenous contrast, helically acquired axial images were obtained from the aortic arch through the skull base during the arterial phase. Maximum intensity projections and/or 3D reconstructions were generated by the technologist at the CT scanner. Contrast: 75 mL ISOVUE 370 Weight-based documented intravenous contrast. All CT scans at this facility use dose modulation, iterative reconstruction, and/or weight based dosing when appropriate to  reduce radiation dose to as low as reasonably achievable.  IMPRESSION: 1. No acute intracranial abnormality. 2. No cervicoencephalic large-vessel arterial occlusion,  dissection, or aneurysm. 3. 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 4. Stenotic right P2 A PCA segment; diminutive remaining posterior circulation. MRI would serve for more definitive assessment of ischemia if clinical suspicion persists.    MRI BRAIN WO CONTRAST  Result: DIAGNOSTIC IMAGING REPORT [facility] - [address] [place], [place] [address] PHONE #: [phone] FAX #: [phone] ------------------------------------------------------------------------------- Name: RUBE, SANCHEZ: [id number] B Radiology No:  DOB: 1956-02-17 Age: 45 Sex: M Status: ADM IN Unit No: [id number]  Phys: Merle Starcher Acct: [id number]  Reason For Exam: etails below ? Exam Date: 04/28/2024 -------------------------------------------------------------------------------  EXAMS: Reason for Exam:: [id number] MRI Brain W/O Contrast CBVD - Cerebrovascular Disease  EXAM: MRI BRAIN WITHOUT CONTRAST CLINICAL DATA: CBVD - Cerebrovascular Disease - Patient p/w stroke like symptoms with headache, confusion, blurred vision, and left sided numbness/tingling. COMPARISON: CT head dated 04/26/2024. TECHNIQUE: Multiplanar multi-sequence MR imaging of the head was performed without the administration of intravenous contrast. FINDINGS: Parenchyma: Moderate evolving right posterior cerebral artery vascular territory infarct. No significant mass effect or hemorrhagic transformation. Ventricular system: No hydrocephalus Skull base and calvarium: No acute or aggressive osseous abnormality. Other findings: Normal orbital contents. Mild mucosal thickening in the paranasal sinuses. Clear mastoid air cells. IMPRESSION: Evolving right posterior cerebral artery vascular territory infarct. No significant mass effect or hemorrhagic transformation.       Claria Crofts, MD 05/31/24 [id number]

## 2024-05-31 NOTE — Discharge Instructions (Addendum)
 Keep your scheduled follow-up with your neurologist.  Return to the ER for new or worsening symptoms.

## 2024-06-03 NOTE — Telephone Encounter (Unsigned)
 Copied from CRM [id number]. Topic: Clinical - Medical Advice >> Jun 03, 2024  4:06 PM Kevelyn M wrote: Reason for CRM: Patient would like the doctor to call back. He went to the ER on Friday and was released for numbness down the arm and fingertips. You had a CT scan and MRI and it wasn't a stroke. Patient would like for Dr. Lincoln Renshaw to call him back. Call back #[phone].

## 2024-06-05 ENCOUNTER — Other Ambulatory Visit: Payer: Self-pay | Admitting: Family Medicine

## 2024-06-07 NOTE — Telephone Encounter (Signed)
 Patient will be out before upcoming appointment.

## 2024-06-07 NOTE — Telephone Encounter (Signed)
 Requested medications are due for refill today.  yes  Requested medications are on the active medications list.  yes  Last refill. 02/07/2024 #90 0 rf  Future visit scheduled.   yes  Notes to clinic.  Abnormal labs.    Requested Prescriptions  Pending Prescriptions Disp Refills   levothyroxine  (SYNTHROID ) 200 MCG tablet [Pharmacy Med Name: LEVOTHYROXINE  0.2MG  ( ) TAB] 90 tablet 0    Sig: TAKE 1 TABLET BY MOUTH EVERY DAY BEFORE BREAKFAST. TAKE WITH FOR TOTAL DAILY DOSE OF .     Endocrinology:  Hypothyroid Agents Failed - 06/07/2024  3:14 PM      Failed - TSH in normal range and within 360 days    TSH  Date Value Ref Range Status  02/06/2024 0.012 (L) 0.450 - 4.500 uIU/mL Final         Passed - Valid encounter within last 12 months    Recent Outpatient Visits           1 week ago Benign hypertensive renal disease   [place] [facility] [place], Megan P, DO   4 weeks ago History of stroke with residual deficit   [facility] [place], Megan P, DO   4 months ago Benign hypertensive renal disease   [place] [facility] [place], [place], DO

## 2024-06-08 ENCOUNTER — Other Ambulatory Visit: Payer: Self-pay | Admitting: Family Medicine

## 2024-06-10 ENCOUNTER — Other Ambulatory Visit: Payer: Self-pay | Admitting: Family Medicine

## 2024-06-10 MED ORDER — LEVOTHYROXINE SODIUM 75 MCG PO TABS: 75.0000 ug | ORAL_TABLET | Freq: Every day | ORAL | 0 refills | Status: DC

## 2024-06-10 MED ORDER — LEVOTHYROXINE SODIUM 200 MCG PO TABS: 200.0000 ug | ORAL_TABLET | Freq: Every day | ORAL | 0 refills | Status: DC

## 2024-06-11 ENCOUNTER — Other Ambulatory Visit: Payer: Self-pay

## 2024-06-11 NOTE — Telephone Encounter (Signed)
 Requested Prescriptions  Refused Prescriptions Disp Refills   amLODipine  (NORVASC ) 10 MG tablet [Pharmacy Med Name: AMLODIPINE  BESYLATE 10MG TABLETS] 90 tablet 1    Sig: TAKE 1 TABLET(10 MG) BY MOUTH DAILY     Cardiovascular: Calcium  Channel Blockers 2 Failed - 06/11/2024 11:58 AM      Failed - Last BP in normal range    BP Readings from Last 1 Encounters:  05/31/24 (!) 177/88         Passed - Last Heart Rate in normal range    Pulse Readings from Last 1 Encounters:  05/31/24 (!) 57         Passed - Valid encounter within last 6 months    Recent Outpatient Visits           2 weeks ago Benign hypertensive renal disease   [place] [facility] [place], Megan P, DO   1 month ago History of stroke with residual deficit   [facility] [place], Megan P, DO   4 months ago Benign hypertensive renal disease   [place] [place], [place], DO

## 2024-06-12 ENCOUNTER — Other Ambulatory Visit: Payer: Self-pay | Admitting: Nurse Practitioner

## 2024-06-12 MED ORDER — AMLODIPINE BESYLATE 10 MG PO TABS: 10.0000 mg | ORAL_TABLET | Freq: Every day | ORAL | 0 refills | Status: DC

## 2024-06-13 NOTE — Telephone Encounter (Signed)
 Requested medications are due for refill today.  no  Requested medications are on the active medications list.  yes  Last refill. 06/12/2024 #30 0 rf  Future visit scheduled.   yes  Notes to clinic.  Pt is requesting a 90 day supply.    Requested Prescriptions  Pending Prescriptions Disp Refills   amLODipine  (NORVASC ) 10 MG tablet [Pharmacy Med Name: AMLODIPINE  BESYLATE 10MG TABLETS] 90 tablet     Sig: TAKE 1 TABLET(10 MG) BY MOUTH DAILY     Cardiovascular: Calcium  Channel Blockers 2 Failed - 06/13/2024  3:59 PM      Failed - Last BP in normal range    BP Readings from Last 1 Encounters:  05/31/24 (!) 177/88         Passed - Last Heart Rate in normal range    Pulse Readings from Last 1 Encounters:  05/31/24 (!) 57         Passed - Valid encounter within last 6 months    Recent Outpatient Visits           2 weeks ago Benign hypertensive renal disease   [facility] [place], Megan P, DO   1 month ago History of stroke with residual deficit   [place] [facility] [place], Megan P, DO   4 months ago Benign hypertensive renal disease    [facility] [place], [place], DO

## 2024-06-17 ENCOUNTER — Ambulatory Visit (INDEPENDENT_AMBULATORY_CARE_PROVIDER_SITE_OTHER): Admitting: Family Medicine

## 2024-06-17 ENCOUNTER — Other Ambulatory Visit: Payer: Self-pay

## 2024-06-17 ENCOUNTER — Encounter: Payer: Self-pay | Admitting: Family Medicine

## 2024-06-17 VITALS — BP 150/72 | HR 51 | Wt 241.2 lb

## 2024-06-17 DIAGNOSIS — I129 Hypertensive chronic kidney disease with stage 1 through stage 4 chronic kidney disease, or unspecified chronic kidney disease: Secondary | ICD-10-CM | POA: Diagnosis not present

## 2024-06-17 DIAGNOSIS — Z794 Long term (current) use of insulin: Secondary | ICD-10-CM | POA: Diagnosis not present

## 2024-06-17 DIAGNOSIS — E0840 Diabetes mellitus due to underlying condition with diabetic neuropathy, unspecified: Secondary | ICD-10-CM

## 2024-06-17 MED ORDER — HYDRALAZINE HCL 50 MG PO TABS: 50.0000 mg | ORAL_TABLET | Freq: Two times a day (BID) | ORAL | 3 refills | Status: DC

## 2024-06-17 MED ORDER — ASPIRIN 81 MG PO CHEW
81.0000 mg | CHEWABLE_TABLET | Freq: Every day | ORAL | 3 refills | Status: AC
Start: 2024-06-17 — End: ?
  Filled 2024-06-17: qty 100, 100d supply, fill #0

## 2024-06-17 MED ORDER — TRESIBA FLEXTOUCH 100 UNIT/ML ~~LOC~~ SOPN
40.0000 [IU] | PEN_INJECTOR | Freq: Every day | SUBCUTANEOUS | 1 refills | Status: DC
  Filled 2024-06-17 – 2024-07-22 (×2): qty 15, 37d supply, fill #0

## 2024-06-17 NOTE — Assessment & Plan Note (Signed)
 Still running high. Will increase his tresiba  to 40 units and recheck in 6 weeks.

## 2024-06-17 NOTE — Progress Notes (Signed)
 BP (!) 150/72   Pulse (!) 51   Wt 241 lb 3.2 oz (109.4 kg)   SpO2 96%   BMI 31.82 kg/m    Subjective:    Patient ID: Manuel Burnett, male    DOB: 1956-10-04, 68 y.o.   MRN: [id number]  HPI: Manuel Burnett is a 68 y.o. male  Chief Complaint  Patient presents with   Hypertension   HYPERTENSION  Hypertension status: uncontrolled  Satisfied with current treatment? no Duration of hypertension: chronic BP monitoring frequency:  a few times a week BP range: 140s/70s BP medication side effects:  no Medication compliance: excellent compliance Previous BP meds: losartan , hydrochlorothiazide , hydralazine , amlodipine  Aspirin: yes Recurrent headaches: no Visual changes: yes Palpitations: no Dyspnea: no Chest pain: no Lower extremity edema: no Dizzy/lightheaded: no  DIABETES Hypoglycemic episodes:no Polydipsia/polyuria: no Visual disturbance: yes Chest pain: no Paresthesias: yes Glucose Monitoring: yes  Accucheck frequency: Daily  Fasting glucose: 122-140 Taking Insulin ?: yes  Long acting insulin : 38 units Blood Pressure Monitoring: a few times a week Retinal Examination: Up to Date Foot Exam: Up to Date Diabetic Education: Completed Pneumovax: Up to Date Influenza: Up to Date Aspirin: yes  Relevant past medical, surgical, family and social history reviewed and updated as indicated. Interim medical history since our last visit reviewed. Allergies and medications reviewed and updated.  Review of Systems  Constitutional: Negative.   Respiratory: Negative.    Cardiovascular: Negative.   Musculoskeletal: Negative.   Neurological: Negative.   Psychiatric/Behavioral: Negative.      Per HPI unless specifically indicated above     Objective:    BP (!) 150/72   Pulse (!) 51   Wt 241 lb 3.2 oz (109.4 kg)   SpO2 96%   BMI 31.82 kg/m   Wt Readings from Last 3 Encounters:  06/17/24 241 lb 3.2 oz (109.4 kg)  05/31/24 232 lb 3.2 oz (105.3 kg)  05/27/24 232 lb  3.2 oz (105.3 kg)    Physical Exam Vitals and nursing note reviewed.  Constitutional:      General: He is not in acute distress.    Appearance: Normal appearance. He is obese. He is not ill-appearing, toxic-appearing or diaphoretic.  HENT:     Head: Normocephalic and atraumatic.     Right Ear: External ear normal.     Left Ear: External ear normal.     Nose: Nose normal.     Mouth/Throat:     Mouth: Mucous membranes are moist.     Pharynx: Oropharynx is clear.   Eyes:     General: No scleral icterus.       Right eye: No discharge.        Left eye: No discharge.     Extraocular Movements: Extraocular movements intact.     Conjunctiva/sclera: Conjunctivae normal.     Pupils: Pupils are equal, round, and reactive to light.    Cardiovascular:     Rate and Rhythm: Normal rate and regular rhythm.     Pulses: Normal pulses.     Heart sounds: Normal heart sounds. No murmur heard.    No friction rub. No gallop.  Pulmonary:     Effort: Pulmonary effort is normal. No respiratory distress.     Breath sounds: Normal breath sounds. No stridor. No wheezing, rhonchi or rales.  Chest:     Chest wall: No tenderness.   Musculoskeletal:        General: Normal range of motion.     Cervical back: Normal  range of motion and neck supple.   Skin:    General: Skin is warm and dry.     Capillary Refill: Capillary refill takes less than 2 seconds.     Coloration: Skin is not jaundiced or pale.     Findings: No bruising, erythema, lesion or rash.   Neurological:     General: No focal deficit present.     Mental Status: He is alert and oriented to person, place, and time. Mental status is at baseline.   Psychiatric:        Mood and Affect: Mood normal.        Behavior: Behavior normal.        Thought Content: Thought content normal.        Judgment: Judgment normal.     Results for orders placed or performed during the hospital encounter of 05/31/24  Ethanol   Collection Time: 05/31/24   1:10 PM  Result Value Ref Range   Alcohol, Ethyl (B) <15 <15 mg/dL  Protime-INR   Collection Time: 05/31/24  1:10 PM  Result Value Ref Range   Prothrombin Time 13.4 11.4 - 15.2 seconds   INR 1.0 0.8 - 1.2  APTT   Collection Time: 05/31/24  1:10 PM  Result Value Ref Range   aPTT 32 24 - 36 seconds  Comprehensive metabolic panel   Collection Time: 05/31/24  1:10 PM  Result Value Ref Range   Sodium 137 135 - 145 mmol/L   Potassium 3.6 3.5 - 5.1 mmol/L   Chloride 102 98 - 111 mmol/L   CO2 26 22 - 32 mmol/L   Glucose, Bld 183 (H) 70 - 99 mg/dL   BUN 26 (H) 8 - 23 mg/dL   Creatinine, Ser 8.70 (H) 0.61 - 1.24 mg/dL   Calcium  9.3 8.9 - 10.3 mg/dL   Total Protein 6.8 6.5 - 8.1 g/dL   Albumin 3.9 3.5 - 5.0 g/dL   AST 19 15 - 41 U/L   ALT 19 0 - 44 U/L   Alkaline Phosphatase 38 38 - 126 U/L   Total Bilirubin 0.9 0.0 - 1.2 mg/dL   GFR, Estimated >39 >39 mL/min   Anion gap 9 5 - 15  CBC WITH DIFFERENTIAL   Collection Time: 05/31/24  1:10 PM  Result Value Ref Range   WBC 6.8 4.0 - 10.5 K/uL   RBC 4.61 4.22 - 5.81 MIL/uL   Hemoglobin 13.5 13.0 - 17.0 g/dL   HCT 61.7 (L) 60.9 - 47.9 %   MCV 82.9 80.0 - 100.0 fL   MCH 29.3 26.0 - 34.0 pg   MCHC 35.3 30.0 - 36.0 g/dL   RDW 87.2 88.4 - 84.4 %   Platelets 178 150 - 400 K/uL   nRBC 0.0 0.0 - 0.2 %   Neutrophils Relative % 61 %   Neutro Abs 4.2 1.7 - 7.7 K/uL   Lymphocytes Relative 22 %   Lymphs Abs 1.5 0.7 - 4.0 K/uL   Monocytes Relative 12 %   Monocytes Absolute 0.8 0.1 - 1.0 K/uL   Eosinophils Relative 4 %   Eosinophils Absolute 0.3 0.0 - 0.5 K/uL   Basophils Relative 1 %   Basophils Absolute 0.0 0.0 - 0.1 K/uL   Immature Granulocytes 0 %   Abs Immature Granulocytes 0.03 0.00 - 0.07 K/uL  Urine Drug Screen, Qualitative   Collection Time: 05/31/24  2:00 PM  Result Value Ref Range   Tricyclic, Ur Screen NONE DETECTED NONE DETECTED   Amphetamines, Ur  Screen NONE DETECTED NONE DETECTED   MDMA (Ecstasy)Ur Screen NONE DETECTED  NONE DETECTED   Cocaine Metabolite,Ur [place] NONE DETECTED NONE DETECTED   Opiate, Ur Screen NONE DETECTED NONE DETECTED   Phencyclidine (PCP) Ur S NONE DETECTED NONE DETECTED   Cannabinoid 50 Ng, Ur [place] NONE DETECTED NONE DETECTED   Barbiturates, Ur Screen NONE DETECTED NONE DETECTED   Benzodiazepine, Ur Scrn NONE DETECTED NONE DETECTED   Methadone Scn, Ur NONE DETECTED NONE DETECTED  Urinalysis, Routine w reflex microscopic -Urine, Clean Catch   Collection Time: 05/31/24  2:00 PM  Result Value Ref Range   Color, Urine STRAW (A) YELLOW   APPearance CLEAR (A) CLEAR   Specific Gravity, Urine 1.010 1.005 - 1.030   pH 6.0 5.0 - 8.0   Glucose, UA >=500 (A) NEGATIVE mg/dL   Hgb urine dipstick NEGATIVE NEGATIVE   Bilirubin Urine NEGATIVE NEGATIVE   Ketones, ur NEGATIVE NEGATIVE mg/dL   Protein, ur 899 (A) NEGATIVE mg/dL   Nitrite NEGATIVE NEGATIVE   Leukocytes,Ua NEGATIVE NEGATIVE   RBC / HPF 0-5 0 - 5 RBC/hpf   WBC, UA 0-5 0 - 5 WBC/hpf   Bacteria, UA NONE SEEN NONE SEEN   Squamous Epithelial / HPF 0 0 - 5 /HPF      Assessment & Plan:   Problem List Items Addressed This Visit       Endocrine   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 Will increase his tresiba  to 40 units and recheck in 6 weeks.       Relevant Medications   insulin  degludec (TRESIBA  FLEXTOUCH) 100 UNIT/ML FlexTouch Pen   aspirin 81 MG chewable tablet     Genitourinary   Benign hypertensive renal disease - Primary   Running high still. Will increase his hydralazine  to 50mg  and recheck in about  a month.        Follow up plan: Return in about 6 weeks (around 07/29/2024) for physical, AWV.

## 2024-06-17 NOTE — Assessment & Plan Note (Signed)
 Running high still. Will increase his hydralazine  to 50mg  and recheck in about  a month.

## 2024-06-24 ENCOUNTER — Other Ambulatory Visit: Payer: Self-pay

## 2024-06-24 ENCOUNTER — Other Ambulatory Visit: Payer: Self-pay | Admitting: Family Medicine

## 2024-06-24 NOTE — Telephone Encounter (Unsigned)
 Copied from CRM [id number]. Topic: Clinical - Medication Refill >> Jun 24, 2024 12:23 PM Jakyia R wrote: Medication: clopidogrel  75 mg  Has the patient contacted their pharmacy? No, this was a medication prescribed through a different provider and pharmacy   This is the patient's preferred pharmacy:  [id number] [place], [place] - [address] AT [facility] [address] [place] [address] [place] [address] Phone: [phone] Fax: [phone]    Is this the correct pharmacy for this prescription? Yes If no, delete pharmacy and type the correct one.   Has the prescription been filled recently? Yes May 11th following a Hospital visit for stroke on may 9th   Is the patient out of the medication? No, up to Friday   Has the patient been seen for an appointment in the last year OR does the patient have an upcoming appointment? Yes  Can we respond through MyChart? Yes  Agent: Please be advised that Rx refills may take up to 3 business days. We ask that you follow-up with your pharmacy.

## 2024-06-24 NOTE — Progress Notes (Signed)
   06/24/2024  Patient ID: Manuel Burnett, male   DOB: 1956-11-28, 68 y.o.   MRN: [id number]  Subjective/Objective Telephone visit to follow-up on management of chronic disease states   Diabetes: Current medications: Tresiba  40 units daily, glipizide  5mg  daily, metformin  XR 1000mg  BID, pioglitazone  30mg  daily -Medications tried in the past: Ozempic  and Trulicity  caused skin rash, Jardiance  and Mounjaro  were not affordable  -Tresiba  dose recently increased due to elevated FBG -Patient has been checking home BG regularly and reports FBG averaging 130-140, but it was 90 this morning; and patient had only used 28 units of Tresiba  (last of what was in pen he was using) -A1c elevated at 8.1% on 5/10 -Patient states he was able to get Tresiba  from [facility] for $35, and generic Toujeo  Max was going to be $100 at [facility]- prefers to continue getting Tresiba  from [facility] as long as availability is consistent   Hypertension: Current medications: amlodipine  10mg  daily, hydralazine  50mg  BID, hydrochlorothiazide  25mg  daily, losartan  100mg  daily -Hydralazine  dose recently increased due to elevated BP - 150/72 at last OV -Patient has a validated, automated, upper arm home BP cuff -Current blood pressure readings readings: 140/70  Hyperlipidemia/ASCVD Risk Reduction Current lipid lowering medications: atorvastatin  80mg  daily, fenofibrate  160mg  daily Antiplatelet regimen: ASA 81mg  daily, clopidogrel  75mg  daily -Recent CVA in early May -Most recent LDL 88, TG 509 -Needing atorvastatin  80mg  refilled -Patient has stopped smoking as of 5/9- quit cold [place], no using any nicotine replacement at this time   Assessment/Plan:    Diabetes: -Currently uncontrolled -Continue current regimen at this time and  regular monitoring of FBG -Due for A1c again in August   Hypertension: -Currently uncontrolled -Continue current regimen at this time and regular monitoring of BP -If BP consistently continues to be  >130/80, could consider further increasing dose of hydralazine  and/or changing hydrochlorothiazide  to chlorthalidone 50mg  if CMP WNL   Hyperlipidemia/ASCVD Risk Reduction: -Currently uncontrolled.  -Continue current regimen -Contacted [facility] and new rx was on profile for Atorvastatin  80mg ; medication will be ready for pickup Wednesday -Recommend follow-up lipid panel and LFTs in August   Follow Up Plan: 4 weeks   [name], PharmD, DPLA

## 2024-06-26 NOTE — Telephone Encounter (Signed)
 Requested medication (s) are due for refill today: -  Requested medication (s) are on the active medication list:  (hx med)  Last refill:  05/10/23  Future visit scheduled: yes  Notes to clinic:  hx provider   Requested Prescriptions  Pending Prescriptions Disp Refills   PLAVIX  75 MG tablet      Sig: Take 1 tablet (75 mg total) by mouth.     Hematology: Antiplatelets - clopidogrel  Failed - 06/26/2024  1:26 PM      Failed - HCT in normal range and within 180 days    HCT  Date Value Ref Range Status  05/31/2024 38.2 (L) 39.0 - 52.0 % Final   Hematocrit  Date Value Ref Range Status  11/09/2023 46.6 37.5 - 51.0 % Final         Failed - Cr in normal range and within 360 days    Creatinine  Date Value Ref Range Status  11/08/2014 1.31 (H) 0.60 - 1.30 mg/dL Final   Creatinine, Ser  Date Value Ref Range Status  05/31/2024 1.29 (H) 0.61 - 1.24 mg/dL Final         Passed - HGB in normal range and within 180 days    Hemoglobin  Date Value Ref Range Status  05/31/2024 13.5 13.0 - 17.0 g/dL Final  88/78/7975 84.5 13.0 - 17.7 g/dL Final         Passed - PLT in normal range and within 180 days    Platelets  Date Value Ref Range Status  05/31/2024 178 150 - 400 K/uL Final  11/09/2023 199 150 - 450 x10E3/uL Final         Passed - Valid encounter within last 6 months    Recent Outpatient Visits           1 week ago Benign hypertensive renal disease   [place] [facility] [place], Megan P, DO   1 month ago Benign hypertensive renal disease   [place] [facility] [place], Megan P, DO   1 month ago History of stroke with residual deficit   [place] [facility] [place], Megan P, DO   4 months ago Benign hypertensive renal disease   [place] [facility] [place] [place] [facility] [place], DO

## 2024-06-27 ENCOUNTER — Other Ambulatory Visit: Payer: Self-pay

## 2024-06-27 MED ORDER — PLAVIX 75 MG PO TABS: 75.0000 mg | ORAL_TABLET | Freq: Every day | ORAL | 1 refills | Status: DC

## 2024-07-01 ENCOUNTER — Other Ambulatory Visit: Payer: Self-pay

## 2024-07-01 ENCOUNTER — Encounter: Payer: Self-pay | Admitting: Emergency Medicine

## 2024-07-02 ENCOUNTER — Ambulatory Visit (INDEPENDENT_AMBULATORY_CARE_PROVIDER_SITE_OTHER): Admitting: Emergency Medicine

## 2024-07-02 VITALS — Ht 73.0 in | Wt 241.0 lb

## 2024-07-02 DIAGNOSIS — Z87891 Personal history of nicotine dependence: Secondary | ICD-10-CM

## 2024-07-02 DIAGNOSIS — Z0000 Encounter for general adult medical examination without abnormal findings: Secondary | ICD-10-CM

## 2024-07-02 MED ORDER — CLOPIDOGREL BISULFATE 75 MG PO TABS: 75.0000 mg | ORAL_TABLET | Freq: Every day | ORAL | 1 refills | Status: DC

## 2024-07-02 NOTE — Progress Notes (Signed)
 Subjective:   Manuel Burnett is a 68 y.o. who presents for a Medicare Wellness preventive visit.  As a reminder, Annual Wellness Visits don't include a physical exam, and some assessments may be limited, especially if this visit is performed virtually. We may recommend an in-person follow-up visit with your provider if needed.  Visit Complete: Virtual I connected with  Manuel Burnett on 07/02/24 by a audio enabled telemedicine application and verified that I am speaking with the correct person using two identifiers.  Patient Location: Home  Provider Location: Office/Clinic  I discussed the limitations of evaluation and management by telemedicine. The patient expressed understanding and agreed to proceed.  Vital Signs: Because this visit was a virtual/telehealth visit, some criteria may be missing or patient reported. Any vitals not documented were not able to be obtained and vitals that have been documented are patient reported.  VideoDeclined- This patient declined Librarian, academic. Therefore the visit was completed with audio only.  Persons Participating in Visit: Patient.  AWV Questionnaire: No: Patient Medicare AWV questionnaire was not completed prior to this visit.  Cardiac Risk Factors include: advanced age (>109men, >34 women);male gender;diabetes mellitus;dyslipidemia;hypertension;obesity (BMI >30kg/m2);Other (see comment), Risk factor comments: OSA (cpap)     Objective:    Today's Vitals   07/02/24 1117  Weight: 241 lb (109.3 kg)  Height: 6' 1 (1.854 m)   Body mass index is 31.8 kg/m.     07/02/2024   11:43 AM 05/31/2024    1:02 PM 04/23/2024   11:02 AM 11/01/2023    6:05 PM 12/29/2022   12:03 PM 05/25/2021   10:27 PM 04/29/2020    3:37 PM  Advanced Directives  Does Patient Have a Medical Advance Directive? No No No No No No No  Would patient like information on creating a medical advance directive? Yes (MAU/Ambulatory/Procedural  Areas - Information given)  No - Patient declined  Yes (ED - Information included in AVS)      Current Medications (verified) Outpatient Encounter Medications as of 07/02/2024  Medication Sig   acetaminophen  (TYLENOL ) 650 MG CR tablet Take 1,300 mg by mouth every 8 (eight) hours as needed for pain.   albuterol  (PROAIR  HFA) 108 (90 Base) MCG/ACT inhaler Inhale 2 puffs into the lungs every 4 (four) hours as needed for wheezing or shortness of breath.   amLODipine  (NORVASC ) 10 MG tablet Take 1 tablet (10 mg total) by mouth daily.   ascorbic acid (VITAMIN C) 500 MG tablet Take 500 mg by mouth daily.   aspirin  81 MG chewable tablet Chew 1 tablet (81 mg total) by mouth daily.   atorvastatin  (LIPITOR) 80 MG tablet Take 1 tablet (80 mg total) by mouth daily.   B-D INTEGRA SYRINGE 22G X 1-1/2 3 ML MISC Inject testosterone  1x every 2 weeks   B-D ULTRAFINE III SHORT PEN 31G X 8 MM MISC Inject into the skin daily.   BD HYPODERMIC NEEDLE 18G X 1 MISC INJECT TESTOSTERONE  EVERY 2 WEEKS AS DIRECTED   clopidogrel  (PLAVIX ) 75 MG tablet Take 1 tablet (75 mg total) by mouth daily.   fenofibrate  160 MG tablet Take 1 tablet (160 mg total) by mouth daily. with food   glipiZIDE  (GLUCOTROL ) 5 MG tablet Take 1 tablet (5 mg total) by mouth daily.   glucose blood (ACCU-CHEK GUIDE TEST) test strip Use as instructed   hydrALAZINE  (APRESOLINE ) 50 MG tablet Take 1 tablet (50 mg total) by mouth 2 (two) times daily.   hydrochlorothiazide  (HYDRODIURIL )  25 MG tablet Take 1 tablet (25 mg total) by mouth daily.   insulin  degludec (TRESIBA  FLEXTOUCH) 100 UNIT/ML FlexTouch Pen Inject 40 Units into the skin at bedtime.   Insulin  Pen Needle (PEN NEEDLES 5/16) 30G X 8 MM MISC 1 each by Does not apply route daily.   levothyroxine  (SYNTHROID ) 200 MCG tablet Take 1 tablet (200 mcg total) by mouth daily before breakfast. Take 1 pill a day. To be taken with the 75mcg for 275mcg daily   levothyroxine  (SYNTHROID ) 75 MCG tablet Take 1  tablet (75 mcg total) by mouth daily. Take 1 pill a day. To be taken with the 200mcg for 275mcg daily   losartan  (COZAAR ) 100 MG tablet Take 1 tablet (100 mg total) by mouth daily.   metFORMIN  (GLUCOPHAGE -XR) 500 MG 24 hr tablet TAKE 2 TABLETS(1000 MG) BY MOUTH IN THE MORNING AND AT BEDTIME   mometasone (ELOCON) 0.1 % ointment Apply topically 2 (two) times daily as needed.   Multiple Vitamin (MULTIVITAMIN WITH MINERALS) TABS tablet Take 1 tablet by mouth daily.   pioglitazone  (ACTOS ) 30 MG tablet Take 1 tablet (30 mg total) by mouth daily.   Vitamin D , Ergocalciferol , (DRISDOL ) 1.25 MG (50000 UNIT) CAPS capsule Take 1 capsule (50,000 Units total) by mouth every 7 (seven) days.   PLAVIX  75 MG tablet Take 1 tablet (75 mg total) by mouth daily. (Patient not taking: Reported on 07/02/2024)   testosterone  cypionate (DEPOTESTOSTERONE CYPIONATE) 200 MG/ML injection ADMINISTER 0.5 ML(100 MG) IN THE MUSCLE EVERY 14 DAYS (Patient not taking: Reported on 07/02/2024)   triamcinolone  cream (KENALOG ) 0.1 % Apply topically 2 (two) times daily. (Patient not taking: Reported on 07/02/2024)   No facility-administered encounter medications on file as of 07/02/2024.    Allergies (verified) Codeine sulfate, Ozempic  (0.25 or 0.5 mg-dose) [semaglutide (0.25 or 0.5mg -dos)], Semaglutide , and Trulicity  [dulaglutide ]   History: Past Medical History:  Diagnosis Date   Cancer (HCC)    SKIN   Chronic kidney disease    Diabetes mellitus without complication (HCC)    Dysplastic nevus 01/08/2021   right upper back biopsy proven compound dysplastic nevus with mild atypia margins involved    Erectile dysfunction    GERD (gastroesophageal reflux disease)    Hyperlipidemia    Hypertension    Hypogonadism male    Hypothyroidism    Proteinuria    Sleep apnea    CPAP   Thyroid  disease    Past Surgical History:  Procedure Laterality Date   CATARACT EXTRACTION W/PHACO Right 12/26/2017   Procedure: CATARACT EXTRACTION PHACO  AND INTRAOCULAR LENS PLACEMENT (IOC);  Surgeon: Jaye Fallow, MD;  Location: [facility] ORS;  Service: Ophthalmology;  Laterality: Right;  US  00:21.8 AP% 21.1 CDE 2.64 FLUID PACK LOT # [id number]   CATARACT EXTRACTION W/PHACO Left 01/09/2018   Procedure: CATARACT EXTRACTION PHACO AND INTRAOCULAR LENS PLACEMENT (IOC);  Surgeon: Jaye Fallow, MD;  Location: [facility] ORS;  Service: Ophthalmology;  Laterality: Left;  US  00:28.9 AP% 10.0 CDE 2.90 Fluid pcak lot # [id number]   CLAVICLE SURGERY     internal fixation   COLONOSCOPY WITH PROPOFOL  N/A 04/06/2020   Procedure: COLONOSCOPY WITH PROPOFOL ;  Surgeon: Unk Corinn Skiff, MD;  Location: [facility] ENDOSCOPY;  Service: Gastroenterology;  Laterality: N/A;  priority 4   EYE SURGERY Bilateral 12/2017   Cataract   FRACTURE SURGERY     HEMORRHOID SURGERY     TONSILLECTOMY     TONSILLECTOMY AND ADENOIDECTOMY     Family History  Problem Relation Age of Onset  Hypertension Mother    Heart disease Father    Heart attack Father    CAD Maternal Grandmother    Cancer Maternal Grandfather    Social History   Socioeconomic History   Marital status: Married    Spouse name: Olam   Number of children: 3   Years of education: Not on file   Highest education level: Some college, no degree  Occupational History   Occupation: retired  Tobacco Use   Smoking status: Former    Current packs/day: 0.00    Average packs/day: 0.5 packs/day for 54.3 years (27.2 ttl pk-yrs)    Types: Cigarettes    Start date: 12/19/1969    Quit date: 04/2024    Years since quitting: 0.2   Smokeless tobacco: Never  Vaping Use   Vaping status: Never Used  Substance and Sexual Activity   Alcohol use: Not Currently    Alcohol/week: 0.0 standard drinks of alcohol   Drug use: No   Sexual activity: Not Currently  Other Topics Concern   Not on file  Social History Narrative   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   Social Drivers of Health   Financial Resource Strain: Low  Risk  (07/02/2024)   Overall Financial Resource Strain (CARDIA)    Difficulty of Paying Living Expenses: Not hard at all  Food Insecurity: No Food Insecurity (07/02/2024)   Hunger Vital Sign    Worried About Running Out of Food in the Last Year: Never true    Ran Out of Food in the Last Year: Never true  Transportation Needs: No Transportation Needs (07/02/2024)   PRAPARE - Administrator, Civil Service (Medical): No    Lack of Transportation (Non-Medical): No  Physical Activity: Inactive (07/02/2024)   Exercise Vital Sign    Days of Exercise per Week: 0 days    Minutes of Exercise per Session: 0 min  Stress: No Stress Concern Present (07/02/2024)   [employer] of Occupational Health - Occupational Stress Questionnaire    Feeling of Stress: Only a little  Social Connections: Socially Integrated (07/02/2024)   Social Connection and Isolation Panel    Frequency of Communication with Friends and Family: More than three times a week    Frequency of Social Gatherings with Friends and Family: More than three times a week    Attends Religious Services: More than 4 times per year    Active Member of [employer] or Organizations: Yes    Attends Engineer, structural: More than 4 times per year    Marital Status: Married    Tobacco Counseling Counseling given: Not Answered    Clinical Intake:  Pre-visit preparation completed: Yes  Pain : No/denies pain     BMI - recorded: 31.8 Nutritional Status: BMI > 30  Obese Nutritional Risks: None Diabetes: Yes CBG done?: No (FBS 103 per patient) Did pt. bring in CBG monitor from home?: No  Lab Results  Component Value Date   HGBA1C 8.1 04/27/2024   HGBA1C 7.0 (H) 02/06/2024   HGBA1C 9.1 (H) 11/09/2023     How often do you need to have someone help you when you read instructions, pamphlets, or other written materials from your doctor or pharmacy?: 3 - Sometimes (legally blind since stroke)  Interpreter Needed?:  No  Information entered by :: Vina Ned, CMA   Activities of Daily Living     07/02/2024   11:25 AM  In your present state of health, do you have any difficulty performing  the following activities:  Hearing? 0  Vision? 1  Comment legally blind since stroke  Difficulty concentrating or making decisions? 0  Walking or climbing stairs? 0  Dressing or bathing? 0  Doing errands, shopping? 1  Comment doesn't drive, wife takes to appointments  Preparing Food and eating ? N  Using the Toilet? N  In the past six months, have you accidently leaked urine? N  Do you have problems with loss of bowel control? N  Managing your Medications? Y  Comment wife fills pill box  Managing your Finances? N  Housekeeping or managing your Housekeeping? N    Patient Care Team: Vicci Duwaine SQUIBB, DO as PCP - General (Family Medicine) Jaye Fallow, MD as Referring Physician (Ophthalmology) Lane Arthea BRAVO, MD as Referring Physician (Neurology) Fernand Elfreda LABOR, MD as Consulting Physician (Pulmonary Disease) Cathlyn Seal, MD as Referring Physician (Dermatology)  I have updated your Care Teams any recent Medical Services you may have received from other providers in the past year.     Assessment:   This is a routine wellness examination for Manuel Burnett.  Hearing/Vision screen Hearing Screening - Comments:: Denies hearing loss  Vision Screening - Comments:: Gets DM eye exams, Dr. Jaye, [place]   Goals Addressed             This Visit's Progress    Patient Stated       Get eye sight back since having stroke       Depression Screen     07/02/2024   11:39 AM 05/09/2024   10:49 AM 02/07/2024    8:15 AM 11/09/2023    9:30 AM 07/24/2023    9:12 AM 04/17/2023    2:39 PM 12/05/2022    8:43 AM  PHQ 2/9 Scores  PHQ - 2 Score 0 0 0 0 0 0 0  PHQ- 9 Score 0 0  0 0  0    Fall Risk     07/02/2024   11:45 AM 02/07/2024    8:14 AM 07/24/2023    9:12 AM 07/24/2023    9:10 AM  04/17/2023    2:40 PM  Fall Risk   Falls in the past year? 0 0 0 0 0  Number falls in past yr: 0 0 0 0 0  Injury with Fall? 0 0 0 0 0  Risk for fall due to : No Fall Risks No Fall Risks No Fall Risks No Fall Risks Impaired mobility  Follow up Falls evaluation completed Falls evaluation completed Falls evaluation completed Falls evaluation completed Falls evaluation completed    MEDICARE RISK AT HOME:  Medicare Risk at Home Any stairs in or around the home?: Yes (2 steps) If so, are there any without handrails?: Yes Home free of loose throw rugs in walkways, pet beds, electrical cords, etc?: Yes Adequate lighting in your home to reduce risk of falls?: Yes Life alert?: No Use of a cane, walker or w/c?: No Grab bars in the bathroom?: No Shower chair or bench in shower?: No Elevated toilet seat or a handicapped toilet?: No  TIMED UP AND GO:  Was the test performed?  No  Cognitive Function: 6CIT completed        07/02/2024   11:47 AM 04/17/2023    2:41 PM  6CIT Screen  What Year? 0 points 0 points  What month? 0 points 0 points  What time? 0 points 0 points  Count back from 20 0 points 0 points  Months in reverse 0  points 0 points  Repeat phrase 0 points 2 points  Total Score 0 points 2 points    Immunizations Immunization History  Administered Date(s) Administered   Fluad Quad(high Dose 65+) 12/05/2022   Influenza,inj,Quad PF,6+ Mos 10/12/2015, 10/09/2017, 01/21/2019, 08/08/2019, 09/14/2020, 11/10/2021, 12/05/2022   PNEUMOCOCCAL CONJUGATE-20 03/06/2023   Pneumococcal Polysaccharide-23 02/02/2016   Tdap 12/20/2011, 04/22/2020   Zoster Recombinant(Shingrix) 11/10/2021, 02/21/2022    Screening Tests Health Maintenance  Topic Date Due   Lung Cancer Screening  11/09/2015   Diabetic kidney evaluation - Urine ACR  04/20/2024   INFLUENZA VACCINE  07/19/2024   HEMOGLOBIN A1C  10/28/2024   OPHTHALMOLOGY EXAM  05/09/2025   Diabetic kidney evaluation - eGFR measurement   05/31/2025   FOOT EXAM  06/17/2025   Medicare Annual Wellness (AWV)  07/02/2025   Colonoscopy  04/06/2030   DTaP/Tdap/Td (3 - Td or Tdap) 04/22/2030   Pneumococcal Vaccine: 50+ Years  Completed   Hepatitis C Screening  Completed   Zoster Vaccines- Shingrix  Completed   Hepatitis B Vaccines  Aged Out   HPV VACCINES  Aged Out   Meningococcal B Vaccine  Aged Out   COVID-19 Vaccine  Discontinued    Health Maintenance  Health Maintenance Due  Topic Date Due   Lung Cancer Screening  11/09/2015   Diabetic kidney evaluation - Urine ACR  04/20/2024   Health Maintenance Items Addressed: Referral sent to [facility] (smoker/hx smoking), See Nurse Notes at the end of this note  Additional Screening:  Vision Screening: Recommended annual ophthalmology exams for early detection of glaucoma and other disorders of the eye. Would you like a referral to an eye doctor? No    Dental Screening: Recommended annual dental exams for proper oral hygiene  Community Resource Referral / Chronic Care Management: CRR required this visit?  No   CCM required this visit?  No   Plan:    I have personally reviewed and noted the following in the patient's chart:   Medical and social history Use of alcohol, tobacco or illicit drugs  Current medications and supplements including opioid prescriptions. Patient is not currently taking opioid prescriptions. Functional ability and status Nutritional status Physical activity Advanced directives List of other physicians Hospitalizations, surgeries, and ER visits in previous 12 months Vitals Screenings to include cognitive, depression, and falls Referrals and appointments  In addition, I have reviewed and discussed with patient certain preventive protocols, quality metrics, and best practice recommendations. A written personalized care plan for preventive services as well as general preventive health recommendations were provided to  patient.   Vina Ned, CMA   07/02/2024   After Visit Summary: (MyChart) Due to this being a telephonic visit, the after visit summary with patients personalized plan was offered to patient via MyChart   Notes:  FBS this morning 103 per patient Placed referral to [facility] for lung cancer screening Declined DM & Nutrition education referral Declined Covid vaccine

## 2024-07-02 NOTE — Patient Instructions (Signed)
 Manuel Burnett , Thank you for taking time out of your busy schedule to complete your Annual Wellness Visit with me. I enjoyed our conversation and look forward to speaking with you again next year. I, as well as your care team,  appreciate your ongoing commitment to your health goals. Please review the following plan we discussed and let me know if I can assist you in the future. Your Game plan/ To Do List    Referrals: If you haven't heard from the office you've been referred to, please reach out to them at the phone provided.  I have placed a Pulmonary Referral for Lung Cancer Screening: [facility] at [address] [place] Phone: [phone]   Follow up Visits: Next Medicare AWV with our clinical staff: 07/08/25 @ 11:20am  (VIDEO VISIT)   Have you seen your provider in the last 6 months (3 months if uncontrolled diabetes)? Yes Next Office Visit with your provider: 08/27/24 @ 8:20am with Dr. Vicci  Clinician Recommendations:  Aim for 30 minutes of exercise or brisk walking, 6-8 glasses of water, and 5 servings of fruits and vegetables each day.       This is a list of the screening recommended for you and due dates:  Health Maintenance  Topic Date Due   Screening for Lung Cancer  11/09/2015   Yearly kidney health urinalysis for diabetes  04/20/2024   Flu Shot  07/19/2024   Hemoglobin A1C  10/28/2024   Eye exam for diabetics  05/09/2025   Yearly kidney function blood test for diabetes  05/31/2025   Complete foot exam   06/17/2025   Medicare Annual Wellness Visit  07/02/2025   Colon Cancer Screening  04/06/2030   DTaP/Tdap/Td vaccine (3 - Td or Tdap) 04/22/2030   Pneumococcal Vaccine for age over 15  Completed   Hepatitis C Screening  Completed   Zoster (Shingles) Vaccine  Completed   Hepatitis B Vaccine  Aged Out   HPV Vaccine  Aged Out   Meningitis B Vaccine  Aged Out   COVID-19 Vaccine  Discontinued    Advanced  directives: (ACP Link)Information on Advanced Care Planning can be found at [place]  Best boy Advance Health Care Directives Advance Health Care Directives. [email or web address] You may also get the forms at your doctor's office. Advance Care Planning is important because it:  [x]  Makes sure you receive the medical care that is consistent with your values, goals, and preferences  [x]  It provides guidance to your family and loved ones and reduces their decisional burden about whether or not they are making the right decisions based on your wishes.  Follow the link provided in your after visit summary or read over the paperwork we have mailed to you to help you started getting your Advance Directives in place. If you need assistance in completing these, please reach out to us  so that we can help you!  See attachments for Preventive Care and Fall Prevention Tips.   Fall Prevention in the Home, Adult Falls can cause injuries and affect people of all ages. There are many simple things that you can do to make your home safe and to help prevent falls. If you need it, ask for help making these changes. What actions can I take to prevent falls? General information Use good lighting in all rooms. Make sure to: Replace any light bulbs that burn out. Turn on lights if it is dark and use night-lights. Keep  items that you use often in easy-to-reach places. Lower the shelves around your home if needed. Move furniture so that there are clear paths around it. Do not keep throw rugs or other things on the floor that can make you trip. If any of your floors are uneven, fix them. Add color or contrast paint or tape to clearly mark and help you see: Grab bars or handrails. First and last steps of staircases. Where the edge of each step is. If you use a ladder or stepladder: Make sure that it is fully opened. Do not climb a closed ladder. Make sure the sides of the ladder are locked in place. Have  someone hold the ladder while you use it. Know where your pets are as you move through your home. What can I do in the bathroom?     Keep the floor dry. Clean up any water that is on the floor right away. Remove soap buildup in the bathtub or shower. Buildup makes bathtubs and showers slippery. Use non-skid mats or decals on the floor of the bathtub or shower. Attach bath mats securely with double-sided, non-slip rug tape. If you need to sit down while you are in the shower, use a non-slip stool. Install grab bars by the toilet and in the bathtub and shower. Do not use towel bars as grab bars. What can I do in the bedroom? Make sure that you have a light by your bed that is easy to reach. Do not use any sheets or blankets on your bed that hang to the floor. Have a firm bench or chair with side arms that you can use for support when you get dressed. What can I do in the kitchen? Clean up any spills right away. If you need to reach something above you, use a sturdy step stool that has a grab bar. Keep electrical cables out of the way. Do not use floor polish or wax that makes floors slippery. What can I do with my stairs? Do not leave anything on the stairs. Make sure that you have a light switch at the top and the bottom of the stairs. Have them installed if you do not have them. Make sure that there are handrails on both sides of the stairs. Fix handrails that are broken or loose. Make sure that handrails are as long as the staircases. Install non-slip stair treads on all stairs in your home if they do not have carpet. Avoid having throw rugs at the top or bottom of stairs, or secure the rugs with carpet tape to prevent them from moving. Choose a carpet design that does not hide the edge of steps on the stairs. Make sure that carpet is firmly attached to the stairs. Fix any carpet that is loose or worn. What can I do on the outside of my home? Use bright outdoor lighting. Repair the  edges of walkways and driveways and fix any cracks. Clear paths of anything that can make you trip, such as tools or rocks. Add color or contrast paint or tape to clearly mark and help you see high doorway thresholds. Trim any bushes or trees on the main path into your home. Check that handrails are securely fastened and in good repair. Both sides of all steps should have handrails. Install guardrails along the edges of any raised decks or porches. Have leaves, snow, and ice cleared regularly. Use sand, salt, or ice melt on walkways during winter months if you live where there is  ice and snow. In the garage, clean up any spills right away, including grease or oil spills. What other actions can I take? Review your medicines with your health care provider. Some medicines can make you confused or feel dizzy. This can increase your chance of falling. Wear closed-toe shoes that fit well and support your feet. Wear shoes that have rubber soles and low heels. Use a cane, walker, scooter, or crutches that help you move around if needed. Talk with your provider about other ways that you can decrease your risk of falls. This may include seeing a physical therapist to learn to do exercises to improve movement and strength. Where to find more information Centers for Disease Control and Prevention, STEADI: TonerPromos.no [employer] on Aging: BaseRingTones.pl National Institute on Aging: BaseRingTones.pl Contact a health care provider if: You are afraid of falling at home. You feel weak, drowsy, or dizzy at home. You fall at home. Get help right away if you: Lose consciousness or have trouble moving after a fall. Have a fall that causes a head injury. These symptoms may be an emergency. Get help right away. Call 911. Do not wait to see if the symptoms will go away. Do not drive yourself to the hospital. This information is not intended to replace advice given to you by your health care provider. Make sure you  discuss any questions you have with your health care provider. Document Revised: 08/08/2022 Document Reviewed: 08/08/2022 Elsevier Patient Education  2024 [employer].

## 2024-07-08 ENCOUNTER — Other Ambulatory Visit: Payer: Self-pay | Admitting: Nurse Practitioner

## 2024-07-08 ENCOUNTER — Other Ambulatory Visit: Payer: Self-pay | Admitting: Family Medicine

## 2024-07-10 ENCOUNTER — Telehealth: Payer: Self-pay

## 2024-07-10 DIAGNOSIS — Z87891 Personal history of nicotine dependence: Secondary | ICD-10-CM

## 2024-07-10 DIAGNOSIS — Z122 Encounter for screening for malignant neoplasm of respiratory organs: Secondary | ICD-10-CM

## 2024-07-10 NOTE — Telephone Encounter (Signed)
 Requested Prescriptions  Pending Prescriptions Disp Refills   amLODipine  (NORVASC ) 10 MG tablet [Pharmacy Med Name: AMLODIPINE  BESYLATE 10MG TABLETS] 90 tablet 0    Sig: TAKE 1 TABLET(10 MG) BY MOUTH DAILY     Cardiovascular: Calcium  Channel Blockers 2 Failed - 07/10/2024 10:10 AM      Failed - Last BP in normal range    BP Readings from Last 1 Encounters:  06/17/24 (!) 150/72         Passed - Last Heart Rate in normal range    Pulse Readings from Last 1 Encounters:  06/17/24 (!) 51         Passed - Valid encounter within last 6 months    Recent Outpatient Visits           3 weeks ago Benign hypertensive renal disease   [place] [facility] [place], Megan P, DO   1 month ago Benign hypertensive renal disease   [place] [facility] [place], Megan P, DO   2 months ago History of stroke with residual deficit   [place] [facility] [place], Megan P, DO   5 months ago Benign hypertensive renal disease   [place] [facility] [place], [place], DO

## 2024-07-10 NOTE — Telephone Encounter (Signed)
.  Lung Cancer Screening Narrative/Criteria Questionnaire (Cigarette Smokers Only- No Cigars/Pipes/vapes)   Manuel Burnett   SDMV:07/11/2024 at 1:00 pm with Josette        [birth year]               LDCT: 07/15/2024    68 y.o.   Phone: [phone]  Lung Screening Narrative (confirm age 28-77 yrs Medicare / 50-80 yrs Private pay insurance)   Insurance information:Medicare Part with [place]   Referring Provider:Johnson, DO   This screening involves an initial phone call with a team member from our program. It is called a shared decision making visit. The initial meeting is required by  insurance and Medicare to make sure you understand the program. This appointment takes about 15-20 minutes to complete. You will complete the screening scan at your scheduled date/time.  This scan takes about 5-10 minutes to complete. You can eat and drink normally before and after the scan.  Criteria questions for Lung Cancer Screening:   Are you a current or former smoker? Former Age began smoking: 15   If you are a former smoker, what year did you quit smoking? Quit 04/26/2024 (within 15 yrs)   To calculate your smoking history, I need an accurate estimate of how many packs of cigarettes you smoked per day and for how many years. (Not just the number of PPD you are now smoking)   Years smoking 53 x Packs per day 1 = Pack years 53   (at least 20 pack yrs)   (Make sure they understand that we need to know how much they have smoked in the past, not just the number of PPD they are smoking now)  Do you have a personal history of cancer?  No    Do you have a family history of cancer? Yes  (cancer type and and relative) Yes grandfather had bladder cancer.  Are you coughing up blood?  No  Have you had unexplained weight loss of 15 lbs or more in the last 6 months? No  It looks like you meet all criteria.  When would be a good time for us  to schedule you for this screening?   Additional information: N/A

## 2024-07-10 NOTE — Telephone Encounter (Signed)
 Requested Prescriptions  Pending Prescriptions Disp Refills   fenofibrate  160 MG tablet [Pharmacy Med Name: FENOFIBRATE  160MG  TABLETS] 90 tablet 1    Sig: TAKE 1 TABLET(160 MG) BY MOUTH DAILY WITH FOOD     Cardiovascular:  Antilipid - Fibric Acid Derivatives Failed - 07/10/2024  9:48 AM      Failed - Cr in normal range and within 360 days    Creatinine  Date Value Ref Range Status  11/08/2014 1.31 (H) 0.60 - 1.30 mg/dL Final   Creatinine, Ser  Date Value Ref Range Status  05/31/2024 1.29 (H) 0.61 - 1.24 mg/dL Final         Failed - HCT in normal range and within 360 days    HCT  Date Value Ref Range Status  05/31/2024 38.2 (L) 39.0 - 52.0 % Final   Hematocrit  Date Value Ref Range Status  11/09/2023 46.6 37.5 - 51.0 % Final         Failed - Lipid Panel in normal range within the last 12 months    Cholesterol, Total  Date Value Ref Range Status  02/06/2024 156 100 - 199 mg/dL Final   Cholesterol  Date Value Ref Range Status  04/27/2024 259 (A) 0 - 200 Final   Cholesterol Piccolo, Waived  Date Value Ref Range Status  08/08/2016 146 <200 mg/dL Final    Comment:                            Desirable                <200                         Borderline High      200- 239                         High                     >239    LDL Chol Calc (NIH)  Date Value Ref Range Status  02/06/2024 74 0 - 99 mg/dL Final   LDL Cholesterol  Date Value Ref Range Status  04/27/2024 88  Final   HDL  Date Value Ref Range Status  04/27/2024 36 35 - 70 Final  02/06/2024 42 >39 mg/dL Final   Triglycerides  Date Value Ref Range Status  04/27/2024 509 (A) 40 - 160 Final   Triglycerides Piccolo,Waived  Date Value Ref Range Status  08/08/2016 263 (H) <150 mg/dL Final    Comment:                            Normal                   <150                         Borderline High     150 - 199                         High                200 - 499  Very High                 >499          Passed - ALT in normal range and within 360 days    ALT  Date Value Ref Range Status  05/31/2024 19 0 - 44 U/L Final         Passed - AST in normal range and within 360 days    AST  Date Value Ref Range Status  05/31/2024 19 15 - 41 U/L Final         Passed - HGB in normal range and within 360 days    Hemoglobin  Date Value Ref Range Status  05/31/2024 13.5 13.0 - 17.0 g/dL Final  88/78/7975 84.5 13.0 - 17.7 g/dL Final         Passed - PLT in normal range and within 360 days    Platelets  Date Value Ref Range Status  05/31/2024 178 150 - 400 K/uL Final  11/09/2023 199 150 - 450 x10E3/uL Final         Passed - WBC in normal range and within 360 days    WBC  Date Value Ref Range Status  05/31/2024 6.8 4.0 - 10.5 K/uL Final         Passed - eGFR is 30 or above and within 360 days    EGFR (African American)  Date Value Ref Range Status  11/08/2014 >60 >30mL/min Final   GFR calc Af Amer  Date Value Ref Range Status  02/12/2021 67 >59 mL/min/1.73 Final    Comment:    **In accordance with recommendations from the NKF-ASN Task force,**   [facility] is in the process of updating its eGFR calculation to the   2021 CKD-EPI creatinine equation that estimates kidney function   without a race variable.    EGFR (Non-African Amer.)  Date Value Ref Range Status  11/08/2014 >60 >18mL/min Final    Comment:    eGFR values <65mL/min/1.73 m2 may be an indication of chronic kidney disease (CKD). Calculated eGFR, using the MRDR Study equation, is useful in  patients with stable renal function. The eGFR calculation will not be reliable in acutely ill patients when serum creatinine is changing rapidly. It is not useful in patients on dialysis. The eGFR calculation may not be applicable to patients at the low and high extremes of body sizes, pregnant women, and vegetarians.    GFR, Estimated  Date Value Ref Range Status  05/31/2024 >60 >60 mL/min Final     Comment:    (NOTE) Calculated using the CKD-EPI Creatinine Equation (2021)    eGFR  Date Value Ref Range Status  05/09/2024 77 >59 mL/min/1.73 Final         Passed - Valid encounter within last 12 months    Recent Outpatient Visits           3 weeks ago Benign hypertensive renal disease   [place] [facility] [place], Megan P, DO   1 month ago Benign hypertensive renal disease   [place] [facility] [place], Megan P, DO   2 months ago History of stroke with residual deficit   [place] [facility] [place], Megan P, DO   5 months ago Benign hypertensive renal disease   [place] [facility] [place], [place], DO

## 2024-07-11 ENCOUNTER — Encounter: Payer: Self-pay | Admitting: *Deleted

## 2024-07-11 ENCOUNTER — Ambulatory Visit (INDEPENDENT_AMBULATORY_CARE_PROVIDER_SITE_OTHER): Admitting: *Deleted

## 2024-07-11 DIAGNOSIS — Z87891 Personal history of nicotine dependence: Secondary | ICD-10-CM

## 2024-07-11 NOTE — Patient Instructions (Signed)

## 2024-07-11 NOTE — Progress Notes (Signed)
  Virtual Visit via Telephone Note  I connected with Manuel Burnett on 07/11/24 at  1:00 PM EDT by telephone and verified that I am speaking with the correct person using two identifiers.  Location: Patient: in home Provider: [address] [place], [place], [address]   Shared Decision Making Visit Lung Cancer Screening Program [phone])   Eligibility: Age 68 y.o. Pack Years Smoking History Calculation 53 (# packs/per year x # years smoked) Recent History of coughing up blood  no Unexplained weight loss? no ( >Than 15 pounds within the last 6 months ) Prior History Lung / other cancer no (Diagnosis within the last 5 years already requiring surveillance chest CT Scans). Smoking Status Former Smoker Former Smokers: Years since quit: less than 1 year  Quit Date: 04-2024  Visit Components: Discussion included one or more decision making aids. yes Discussion included risk/benefits of screening. yes Discussion included potential follow up diagnostic testing for abnormal scans. yes Discussion included meaning and risk of over diagnosis. yes Discussion included meaning and risk of False Positives. yes Discussion included meaning of total radiation exposure. yes  Counseling Included: Importance of adherence to annual lung cancer LDCT screening. yes Impact of comorbidities on ability to participate in the program. yes Ability and willingness to under diagnostic treatment. yes  Smoking Cessation Counseling: Current Smokers:  Discussed importance of smoking cessation. yes Information about tobacco cessation classes and interventions provided to patient. yes Patient provided with ticket for LDCT Scan. yes Symptomatic Patient. no  CounselingNA Diagnosis Code: Tobacco Use Z72.0 Asymptomatic Patient yes  Counseling (Intermediate counseling: > three minutes counseling) H9563 Former Smokers:  Discussed the importance of maintaining cigarette abstinence. yes Diagnosis Code:  Personal History of Nicotine Dependence. S12.108 Information about tobacco cessation classes and interventions provided to patient. Yes Patient provided with ticket for LDCT Scan. yes Written Order for Lung Cancer Screening with LDCT placed in Epic. Yes (CT Chest Lung Cancer Screening Low Dose W/O CM) [id number] Z12.2-Screening of respiratory organs Z87.891-Personal history of nicotine dependence   Josette Ranger, RN 07/11/24

## 2024-07-17 ENCOUNTER — Telehealth: Payer: Self-pay

## 2024-07-17 NOTE — Telephone Encounter (Signed)
 Copied from CRM [id number]. Topic: Clinical - Prescription Issue >> Jul 17, 2024 10:02 AM Manuel Burnett wrote: Reason for CRM: Pharmacy called and needs specific testing instructions added to prescription for glucose blood (ACCU-CHEK GUIDE TEST) test strip [[id number]]//Please resend

## 2024-07-17 NOTE — Telephone Encounter (Signed)
 Returned call to pharmacy. Confirmed for type 2 insulin  treated patient is allowed up to three times daily testing.

## 2024-07-22 ENCOUNTER — Other Ambulatory Visit: Payer: Self-pay

## 2024-07-22 NOTE — Progress Notes (Signed)
   07/22/2024  Patient ID: Manuel Burnett, male   DOB: 1956-11-06, 68 y.o.   MRN: [id number]  Subjective/Objective Telephone visit to follow-up on management of chronic disease states    Diabetes: Current medications: Tresiba  40 units daily, glipizide  5mg  daily, metformin  XR 1000mg  BID, pioglitazone  30mg  daily -Medications tried in the past: Ozempic  and Trulicity  caused skin rash, Jardiance  and Mounjaro  were not affordable  -Patient has been checking home BG regularly and reports FBG averaging 100  -Patient states Tresiba  pen will end with only about 28 units left in pen, so some days he will only just this amount versus starting new pen -Does endorse some instances of BG in the 70's, which makes him feel funny.  This often occurs with large gaps between meals and is resolved with a snack. -A1c elevated at 8.1% on 5/10  Hypertension: Current medications: amlodipine  10mg  daily, hydralazine  50mg  BID, hydrochlorothiazide  25mg  daily, losartan  100mg  daily -Hydralazine  dose recently increased due to elevated BP - 150/72 at last OV -Patient has a validated, automated, upper arm home BP cuff -Current blood pressure readings readings: 140-160/70 -Patient endorses having 1 coffee and 1 Coke Zero Daily -Continues not to smoke -Is mindful of sodium consumption   Hyperlipidemia/ASCVD Risk Reduction Current lipid lowering medications: atorvastatin  80mg  daily, fenofibrate  160mg  daily Antiplatelet regimen: ASA 81mg  daily, clopidogrel  75mg  daily -Recent CVA in early May -Most recent LDL 88, TG 509 -Patient was seen by neurology in July and referral was placed for vascular [facility]y at [facility], because 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  Appointment scheduled for October.   Assessment/Plan:    Diabetes: -Currently uncontrolled but home BG readings reflect improvement -Continue current regimen at this time and  regular monitoring of FBG -Bring BG readings to next PCP visit -Sees PCP again in  Sept and is due for A1c; based on result, could consider decreasing Tresiba  slightly to prevent s/sx of hypoglycemia   Hypertension: -Currently uncontrolled -Continue current regimen at this time and regular monitoring of BP -Limit caffeine intake for at least 1 week to see if this lowers BP -Bring home BP readings to next visit with PCP -If BP consistently continues to be >130/80, could consider further increasing dose of hydralazine  to 100mg  BID    Hyperlipidemia/ASCVD Risk Reduction: -Currently uncontrolled.  -Continue current regimen -Keep scheduled appointment with vascular neurology at [facility] -Recommend follow-up lipid panel and LFTs in August   Follow Up Plan: 2 months   [name], PharmD, DPLA

## 2024-07-22 NOTE — Progress Notes (Unsigned)
   07/22/2024  Patient ID: Manuel Burnett, male   DOB: 01-24-1956, 68 y.o.   MRN: [id number]  Subjective/Objective Telephone visit to follow-up on management of chronic disease states    Diabetes: Current medications: Tresiba  40 units daily, glipizide  5mg  daily, metformin  XR 1000mg  BID, pioglitazone  30mg  daily -Medications tried in the past: Ozempic  and Trulicity  caused skin rash, Jardiance  and Mounjaro  were not affordable  -Tresiba  dose recently increased due to elevated FBG -Patient has been checking home BG regularly and reports FBG averaging 130-140, but it was 90 this morning; and patient had only used 28 units of Tresiba  (last of what was in pen he was using) -A1c elevated at 8.1% on 5/10 -Patient states he was able to get Tresiba  from [facility] for $35, and generic Toujeo  Max was going to be $100 at [facility]- prefers to continue getting Tresiba  from [facility] as long as availability is consistent   Hypertension: Current medications: amlodipine  10mg  daily, hydralazine  50mg  BID, hydrochlorothiazide  25mg  daily, losartan  100mg  daily -Hydralazine  dose recently increased due to elevated BP - 150/72 at last OV -Patient has a validated, automated, upper arm home BP cuff -Current blood pressure readings readings: 140/70   Hyperlipidemia/ASCVD Risk Reduction Current lipid lowering medications: atorvastatin  80mg  daily, fenofibrate  160mg  daily Antiplatelet regimen: ASA 81mg  daily, clopidogrel  75mg  daily -Recent CVA in early May -Most recent LDL 88, TG 509 -Needing atorvastatin  80mg  refilled -Patient has stopped smoking as of 5/9- quit cold [place], no using any nicotine replacement at this time   Assessment/Plan:    Diabetes: -Currently uncontrolled -Continue current regimen at this time and  regular monitoring of FBG -Due for A1c again in August   Hypertension: -Currently uncontrolled -Continue current regimen at this time and regular monitoring of BP -If BP consistently continues to be  >130/80, could consider further increasing dose of hydralazine  and/or changing hydrochlorothiazide  to chlorthalidone 50mg  if CMP WNL   Hyperlipidemia/ASCVD Risk Reduction: -Currently uncontrolled.  -Continue current regimen -Contacted [facility] and new rx was on profile for Atorvastatin  80mg ; medication will be ready for pickup Wednesday -Recommend follow-up lipid panel and LFTs in August   Follow Up Plan: 4 weeks   Manuel Burnett, PharmD, DPLA

## 2024-07-25 ENCOUNTER — Other Ambulatory Visit: Payer: Self-pay

## 2024-07-31 ENCOUNTER — Ambulatory Visit: Attending: Neurology

## 2024-07-31 DIAGNOSIS — R278 Other lack of coordination: Secondary | ICD-10-CM

## 2024-07-31 DIAGNOSIS — I69398 Other sequelae of cerebral infarction: Secondary | ICD-10-CM | POA: Diagnosis present

## 2024-07-31 DIAGNOSIS — M6281 Muscle weakness (generalized): Secondary | ICD-10-CM

## 2024-07-31 DIAGNOSIS — H539 Unspecified visual disturbance: Secondary | ICD-10-CM | POA: Insufficient documentation

## 2024-07-31 DIAGNOSIS — I63531 Cerebral infarction due to unspecified occlusion or stenosis of right posterior cerebral artery: Secondary | ICD-10-CM | POA: Insufficient documentation

## 2024-07-31 NOTE — Therapy (Signed)
 OUTPATIENT OCCUPATIONAL THERAPY NEURO EVALUATION  Patient Name: Manuel Burnett MRN: [id number] DOB:03-16-1956, 68 y.o., male Today's Date: 08/03/2024  PCP: Dr. Duwaine Louder, DO REFERRING PROVIDER: Dr. Arthea Farrow  END OF SESSION:  OT End of Session - 08/03/24 2113     Visit Number 1    Number of Visits 12    Date for OT Re-Evaluation 10/23/24    OT Start Time 1400    OT Stop Time 1445    OT Time Calculation (min) 45 min    Activity Tolerance Patient tolerated treatment well    Behavior During Therapy [facility] for tasks assessed/performed         Past Medical History:  Diagnosis Date   Cancer (HCC)    SKIN   Chronic kidney disease    Diabetes mellitus without complication (HCC)    Dysplastic nevus 01/08/2021   right upper back biopsy proven compound dysplastic nevus with mild atypia margins involved    Erectile dysfunction    GERD (gastroesophageal reflux disease)    Hyperlipidemia    Hypertension    Hypogonadism male    Hypothyroidism    Proteinuria    Sleep apnea    CPAP   Thyroid  disease    Past Surgical History:  Procedure Laterality Date   CATARACT EXTRACTION W/PHACO Right 12/26/2017   Procedure: CATARACT EXTRACTION PHACO AND INTRAOCULAR LENS PLACEMENT (IOC);  Surgeon: Jaye Fallow, MD;  Location: [facility] ORS;  Service: Ophthalmology;  Laterality: Right;  US  00:21.8 AP% 21.1 CDE 2.64 FLUID PACK LOT # [id number]   CATARACT EXTRACTION W/PHACO Left 01/09/2018   Procedure: CATARACT EXTRACTION PHACO AND INTRAOCULAR LENS PLACEMENT (IOC);  Surgeon: Jaye Fallow, MD;  Location: [facility] ORS;  Service: Ophthalmology;  Laterality: Left;  US  00:28.9 AP% 10.0 CDE 2.90 Fluid pcak lot # [id number]   CLAVICLE SURGERY     internal fixation   COLONOSCOPY WITH PROPOFOL  N/A 04/06/2020   Procedure: COLONOSCOPY WITH PROPOFOL ;  Surgeon: Unk Corinn Skiff, MD;  Location: [facility] ENDOSCOPY;  Service: Gastroenterology;  Laterality: N/A;  priority 4   EYE SURGERY Bilateral 12/2017    Cataract   FRACTURE SURGERY     HEMORRHOID SURGERY     TONSILLECTOMY     TONSILLECTOMY AND ADENOIDECTOMY     Patient Active Problem List   Diagnosis Date Noted   History of stroke with residual deficit 05/10/2024   Vitamin D  deficiency 05/10/2024   Transient ischemic attack 04/26/2024   Advance directive discussed with patient 04/17/2023   CPAP use counseling 06/13/2022   COVID-19 05/25/2021   Encounter for commercial driving license (CDL) exam 93/96/7978   Benign hypertensive renal disease 08/03/2015   Hyperlipemia 08/03/2015   Diabetes mellitus due to underlying condition with diabetic neuropathy (HCC) 08/03/2015   Chronic kidney disease, stage II (mild) 08/03/2015   Male hypogonadism 08/03/2015   Hypothyroidism 08/03/2015   Proteinuria 08/03/2015   ED (erectile dysfunction) 08/03/2015   OSA on CPAP 08/03/2015   Secondary hyperparathyroidism (HCC) 08/03/2015   Diverticulosis 08/03/2015   Closed fracture of left scapula 11/09/2014   Closed fracture of shaft of left clavicle 11/09/2014   Multiple fractures of ribs of left side 11/09/2014   ONSET DATE: 04/26/24  REFERRING DIAG: P36.468 (ICD-10-CM) - Cerebral infarction due to unspecified occlusion or stenosis of right posterior cerebral artery   THERAPY DIAG:  Cerebral infarction due to unspecified occlusion or stenosis of right posterior cerebral artery (HCC)  Visual disturbance following cerebrovascular accident (CVA)  Rationale for Evaluation and Treatment: Rehabilitation  SUBJECTIVE:  SUBJECTIVE STATEMENT: Pt reports that he can't read and write well right now d/t his visual deficits post stroke. Pt accompanied by: self, spouse Giles)  PERTINENT HISTORY:  HX OF STROKE/ VISION LOSS/ TINGLING/ BLURRY VISION/ MUSCLE JERKING Patient with hospital admission on 04/26/2024 for stroke. Patient was admitted at [facility] in [place], [place]. Do not have records on file of hospital admission. Patient reported  that he had abrupt tingling and numbness in his left arm, hand, around his lips, and changes in vision. No slurred speech, one sided weakness or facial droop. No LOC, dizziness, changes in balance, headaches or confusion. Noted that when he was in the ER his blood pressure was very elevated. Reports that prior to stroke he has had TIAs and was in the ER on 04/23/2024 for hypertension. BP was in the 200s and was having headaches. Was started on Aspirin  and Plavix  after stroke. Has stopped smoking and consuming alcohol. Patient is reporting with residual symptoms since stroke, ongoing vision difficulty. States residual blurry vision and blind spots in bilateral eyes. Has been following with Ophthalmology for vision loss. Was told that he has lost 25% of vision in bilateral eyes. Loss of periheral vision in the left upper quadrant. Reports seeing extra roads and cars when driving since stroke. Residual numbness and tingling in his left arm and leg. No weakness. Has noted new onset of sporadic muscle jerking since stroke.   PRECAUTIONS: Other: L upper quadrant field cut  PAIN:  Are you having pain? No  FALLS: Has patient fallen in last 6 months? 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  OBJECTIVE:  Note: Objective measures were completed at Evaluation unless otherwise noted.  HAND DOMINANCE: Left  ADLs: Overall ADLs: indep Transfers/ambulation related to ADLs: indep Watching tv: Difficulty scanning top L of screen when scrolling through tv apps/streaming services  IADLs:  Shopping: supv d/t inability to drive post CVA Light housekeeping: indep Meal Prep:  indep Community mobility: supv in unfamiliar environments d/t visual deficits Medication management: indep Financial management: indep with autopay on phone Handwriting: intact, but requiring extra time to move between lines on a page d/t visual deficits  MOBILITY STATUS: Independent without AD; supv in some community settings d/t visual deficits  POSTURE COMMENTS:  No Significant postural limitations  ACTIVITY TOLERANCE: Activity tolerance: WFL for tasks assessed  UPPER EXTREMITY ROM:  BUEs WNL  UPPER EXTREMITY MMT:  BUEs 5/5  HAND FUNCTION: Grip strength: Right: 75 lbs; Left: 84 lbs, Lateral pinch: Right: 19 lbs, Left: 20 lbs, and 3 point pinch: Right: 17 lbs, Left: 17 lbs  COORDINATION: 9 Hole Peg test: Right: 28 sec; Left: 23 sec  SENSATION: occasional tingling/numbness in L hand fingers   EDEMA: No visible edema  MUSCLE TONE: LUE: Within functional limits  COGNITION: Overall cognitive status: Within functional limits for tasks assessed  VISION: Subjective report: Pt acknowledges inability to see in L upper quadrant of both eyes  VISION ASSESSMENT: Ocular ROM: WFL Tracking/Visual pursuits: Decreased smoothness of eye movement to Left superior field Saccades: additional eye shifts occurred during testing Visual Fields: Left visual field deficits (  L superior field)  Patient has difficulty with following activities due to following visual impairments: reading and writing   PERCEPTION: WFL  PRAXIS: WFL  OBSERVATIONS:  Pt pleasant and cooperative and receptive to tx plan to provide compensatory strategies to ease reading/writing tasks for work and leisure.                                                                                         TREATMENT DATE: 07/31/24 Evaluation completed. -Line cancellation task: 31/35 lines successfully cancelled -Line bisection test: Partially completed d/t time constraints; pt attempted 8 of 17 lines (of 8 lines bisected, 6/8  lines bisected accurately using good compensation with head turns and identifying line ends with fingertips)   -Star cancellation task: Not yet completed  Self Care: -ADL routines reviewed -Initiated education r/t compensatory strategies for improved visual scanning of interpersonal and extrapersonal space with head turns and use of external cues to identify outlines of paper/tv screen   PATIENT EDUCATION: Education details: OT role, goals, poc Person educated: Patient and Spouse Education method: Explanation and Verbal cues Education comprehension: verbalized understanding and needs further education  HOME EXERCISE PROGRAM: Not yet initiated  GOALS: Goals reviewed with patient? Yes  SHORT TERM GOALS: Target date: 09/11/24  Pt will verbalize and implement 1-2 compensatory strategies to increase indep with scrolling through apps on tv screen. Baseline: Eval: Spouse assists pt to find apps on top L part of tv screen Goal status: INITIAL  LONG TERM GOALS: Target date: 10/23/24  Pt will identify and implement 2 or more compensatory strategies to enable reading for pastoral work and/or ADL/leisure with reported improved efficiency. Baseline: Eval: Poor efficiency with reading d/t impaired visual scanning/L superior field deficit Goal status: INITIAL  2.  Pt will identify and implement 2 or more compensatory strategies to enable writing for pastoral work and/or ADL/leisure with reported improved efficiency. Baseline: Eval: Poor efficiency with writing d/t impaired visual scanning/L superior field deficit; pt struggles to identify page ends and transitioning from one line to the next when writing multiple sentences. Goal status: INITIAL  3.  Pt will demonstrate improved organizational scanning patterns with a paper/pencil task as demonstrated by completion of a line cancellation task with a deficit of 1 or less. Baseline: Eval: Able to cancel 31 of 35 lines on line cancellation task; pt  demos disorganized scanning patterns Goal status: INITIAL  ASSESSMENT:  CLINICAL IMPRESSION: Patient is a 68 y.o. male who was seen today for occupational therapy evaluation for functional decline related to R PCA CVA.  Pt presents with L superior field deficit, functionally impacting ability to drive and to read and write with good efficiency.  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  Pt demonstrates and verbalizes difficulty with visual scanning tasks within his interpersonal and extra-personal spaces.  Pt will benefit from skilled OT to  provide instruction in visual compensatory strategies/environmental adaptations/AE options, and provide activities and/or HEP to promote organized visual scanning for improved indep with ADL/IADL/leisure/work related tasks.    PERFORMANCE DEFICITS: in functional skills including ADLs, IADLs, sensation, decreased knowledge of use of DME, and vision, cognitive skills including, and psychosocial skills including coping strategies, environmental adaptation, habits, and routines and behaviors.   IMPAIRMENTS: are limiting patient from ADLs, IADLs, work, leisure, and social participation.   CO-MORBIDITIES: has co-morbidities such as DM, HTN that affects occupational performance. Patient will benefit from skilled OT to address above impairments and improve overall function.  MODIFICATION OR ASSISTANCE TO COMPLETE EVALUATION: No modification of tasks or assist necessary to complete an evaluation.  OT OCCUPATIONAL PROFILE AND HISTORY: Detailed assessment: Review of records and additional review of physical, cognitive, psychosocial history related to current functional performance.  CLINICAL DECISION  MAKING: Moderate - several treatment options, min-mod task modification necessary  REHAB POTENTIAL: Good for goals noted above  EVALUATION COMPLEXITY: Moderate    PLAN:  OT FREQUENCY: 1x/week  OT DURATION: up to 12 weeks  PLANNED INTERVENTIONS: 97168 OT Re-evaluation, 97535 self care/ADL training, 02889 therapeutic exercise, 97530 therapeutic activity, 97112 neuromuscular re-education, visual/perceptual remediation/compensation, psychosocial skills training, coping strategies training, patient/family education, and DME and/or AE instructions  RECOMMENDED OTHER SERVICES: None at this time (pt is under the care of neurology and ophthalmology)   CONSULTED AND AGREED WITH PLAN OF CARE: Patient and family member/caregiver  PLAN FOR NEXT SESSION: See above   Inocente Blazing, MS, OTR/L   Inocente MARLA Blazing, OT 08/03/2024, 9:14 PM

## 2024-08-07 ENCOUNTER — Ambulatory Visit

## 2024-08-07 DIAGNOSIS — I63531 Cerebral infarction due to unspecified occlusion or stenosis of right posterior cerebral artery: Secondary | ICD-10-CM | POA: Diagnosis not present

## 2024-08-07 DIAGNOSIS — H539 Unspecified visual disturbance: Secondary | ICD-10-CM

## 2024-08-10 NOTE — Therapy (Signed)
 OUTPATIENT OCCUPATIONAL THERAPY NEURO TREATMENT NOTE  Patient Name: Manuel Burnett MRN: [id number] DOB:1956-01-05, 68 y.o., male Today's Date: 08/10/2024  PCP: Dr. Duwaine Louder, DO REFERRING PROVIDER: Dr. Arthea Farrow  END OF SESSION:  OT End of Session - 08/10/24 1820     Visit Number 2    Number of Visits 12    Date for OT Re-Evaluation 10/23/24    OT Start Time 1315    OT Stop Time 1400    OT Time Calculation (min) 45 min    Activity Tolerance Patient tolerated treatment well    Behavior During Therapy [facility] for tasks assessed/performed         Past Medical History:  Diagnosis Date   Cancer (HCC)    SKIN   Chronic kidney disease    Diabetes mellitus without complication (HCC)    Dysplastic nevus 01/08/2021   right upper back biopsy proven compound dysplastic nevus with mild atypia margins involved    Erectile dysfunction    GERD (gastroesophageal reflux disease)    Hyperlipidemia    Hypertension    Hypogonadism male    Hypothyroidism    Proteinuria    Sleep apnea    CPAP   Thyroid  disease    Past Surgical History:  Procedure Laterality Date   CATARACT EXTRACTION W/PHACO Right 12/26/2017   Procedure: CATARACT EXTRACTION PHACO AND INTRAOCULAR LENS PLACEMENT (IOC);  Surgeon: Jaye Fallow, MD;  Location: [facility] ORS;  Service: Ophthalmology;  Laterality: Right;  US  00:21.8 AP% 21.1 CDE 2.64 FLUID PACK LOT # [id number]   CATARACT EXTRACTION W/PHACO Left 01/09/2018   Procedure: CATARACT EXTRACTION PHACO AND INTRAOCULAR LENS PLACEMENT (IOC);  Surgeon: Jaye Fallow, MD;  Location: [facility] ORS;  Service: Ophthalmology;  Laterality: Left;  US  00:28.9 AP% 10.0 CDE 2.90 Fluid pcak lot # [id number]   CLAVICLE SURGERY     internal fixation   COLONOSCOPY WITH PROPOFOL  N/A 04/06/2020   Procedure: COLONOSCOPY WITH PROPOFOL ;  Surgeon: Unk Corinn Skiff, MD;  Location: [facility] ENDOSCOPY;  Service: Gastroenterology;  Laterality: N/A;  priority 4   EYE SURGERY Bilateral 12/2017    Cataract   FRACTURE SURGERY     HEMORRHOID SURGERY     TONSILLECTOMY     TONSILLECTOMY AND ADENOIDECTOMY     Patient Active Problem List   Diagnosis Date Noted   History of stroke with residual deficit 05/10/2024   Vitamin D  deficiency 05/10/2024   Transient ischemic attack 04/26/2024   Advance directive discussed with patient 04/17/2023   CPAP use counseling 06/13/2022   COVID-19 05/25/2021   Encounter for commercial driving license (CDL) exam 93/96/7978   Benign hypertensive renal disease 08/03/2015   Hyperlipemia 08/03/2015   Diabetes mellitus due to underlying condition with diabetic neuropathy (HCC) 08/03/2015   Chronic kidney disease, stage II (mild) 08/03/2015   Male hypogonadism 08/03/2015   Hypothyroidism 08/03/2015   Proteinuria 08/03/2015   ED (erectile dysfunction) 08/03/2015   OSA on CPAP 08/03/2015   Secondary hyperparathyroidism (HCC) 08/03/2015   Diverticulosis 08/03/2015   Closed fracture of left scapula 11/09/2014   Closed fracture of shaft of left clavicle 11/09/2014   Multiple fractures of ribs of left side 11/09/2014   ONSET DATE: 04/26/24  REFERRING DIAG: P36.468 (ICD-10-CM) - Cerebral infarction due to unspecified occlusion or stenosis of right posterior cerebral artery   THERAPY DIAG:  Visual disturbance following cerebrovascular accident (CVA)  Cerebral infarction due to unspecified occlusion or stenosis of right posterior cerebral artery (HCC)  Rationale for Evaluation and Treatment: Rehabilitation  SUBJECTIVE:   SUBJECTIVE STATEMENT: 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 Pt accompanied by: self, spouse Giles)  PERTINENT HISTORY:  HX OF STROKE/ VISION LOSS/ TINGLING/ BLURRY VISION/ MUSCLE JERKING Patient with hospital admission on 04/26/2024 for stroke. Patient was admitted at [facility] in [place], [place]. Do not have records on file of hospital  admission. Patient reported that he had abrupt tingling and numbness in his left arm, hand, around his lips, and changes in vision. No slurred speech, one sided weakness or facial droop. No LOC, dizziness, changes in balance, headaches or confusion. Noted that when he was in the ER his blood pressure was very elevated. Reports that prior to stroke he has had TIAs and was in the ER on 04/23/2024 for hypertension. BP was in the 200s and was having headaches. Was started on Aspirin  and Plavix  after stroke. Has stopped smoking and consuming alcohol. Patient is reporting with residual symptoms since stroke, ongoing vision difficulty. States residual blurry vision and blind spots in bilateral eyes. Has been following with Ophthalmology for vision loss. Was told that he has lost 25% of vision in bilateral eyes. Loss of periheral vision in the left upper quadrant. Reports seeing extra roads and cars when driving since stroke. Residual numbness and tingling in his left arm and leg. No weakness. Has noted new onset of sporadic muscle jerking since stroke.   PRECAUTIONS: Other: L upper quadrant field cut  PAIN:  Are you having pain? No  FALLS: Has patient fallen in last 6 months? 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  OBJECTIVE:  Note: Objective measures were completed at Evaluation unless otherwise noted.  HAND DOMINANCE: Left  ADLs: Overall ADLs: indep Transfers/ambulation related to ADLs: indep Watching tv: Difficulty scanning top L of screen when scrolling through tv apps/streaming services  IADLs:  Shopping: supv d/t inability to drive post CVA Light housekeeping:  indep Meal Prep: indep Community mobility: supv in unfamiliar environments d/t visual deficits Medication management: indep Financial management: indep with autopay on phone Handwriting: intact, but requiring extra time to move between lines on a page d/t visual deficits  MOBILITY STATUS: Independent without AD; supv in some community settings d/t visual deficits  POSTURE COMMENTS:  No Significant postural limitations  ACTIVITY TOLERANCE: Activity tolerance: WFL for tasks assessed  UPPER EXTREMITY ROM:  BUEs WNL  UPPER EXTREMITY MMT:  BUEs 5/5  HAND FUNCTION: Grip strength: Right: 75 lbs; Left: 84 lbs, Lateral pinch: Right: 19 lbs, Left: 20 lbs, and 3 point pinch: Right: 17 lbs, Left: 17 lbs  COORDINATION: 9 Hole Peg test: Right: 28 sec; Left: 23 sec  SENSATION: occasional tingling/numbness in L hand fingers   EDEMA: No visible edema  MUSCLE TONE: LUE: Within functional limits  COGNITION: Overall cognitive status: Within functional limits for tasks assessed  VISION: Subjective report: Pt acknowledges inability to see in L upper quadrant of both eyes  VISION ASSESSMENT: Ocular ROM: WFL Tracking/Visual pursuits: Decreased smoothness of eye movement to Left superior field Saccades: additional eye shifts occurred during testing Visual Fields:  Left visual field deficits (L superior field)  Patient has difficulty with following activities due to following visual impairments: reading and writing   PERCEPTION: WFL  PRAXIS: WFL  OBSERVATIONS:  Pt pleasant and cooperative and receptive to tx plan to provide compensatory strategies to ease reading/writing tasks for work and leisure.                                                                                         TREATMENT DATE: 08/07/24 Therapeutic Activity: -Finished line bisection test from eval carryover; extra time required but good use of R/L IF to identify line ends: 13/17 accurately bisected -Participation in  saccades and smooth pursuits paper/pencil tasks, with trial of a line reader vs use of R/L IF to determine efficient compensatory strategies for reading/writing.  Completed timed trials for both, noting speed to be consistently better when pt used his fingers for scanning a line and identifying ends of each line.    Self Care: -Review of strategies pt has found useful over the last week when scanning apps on tv and writing notes for sermon -Reviewed organized scanning strategies to reduce missed words/notes on paper  -Used thick vertical black marker line for identifying L end of page for reading/writing, and encouraged use of high lighted headings to more easily navigate sermon notes  PATIENT EDUCATION: Education details: visual compensatory strategies/organized scanning patterns Person educated: Patient Education method: Explanation and Verbal cues, demo Education comprehension: verbalized understanding and returned demonstration  HOME EXERCISE PROGRAM: Reading/writing with organized visual search strategies and compensatory strategies noted above  GOALS: Goals reviewed with patient? Yes  SHORT TERM GOALS: Target date: 09/11/24  Pt will verbalize and implement 1-2 compensatory strategies to increase indep with scrolling through apps on tv screen. Baseline: Eval: Spouse assists pt to find apps on top L part of tv screen Goal status: INITIAL  LONG TERM GOALS: Target date: 10/23/24  Pt will identify and implement 2 or more compensatory strategies to enable reading for pastoral work and/or ADL/leisure with reported improved efficiency. Baseline: Eval: Poor efficiency with reading d/t impaired visual scanning/L superior field deficit Goal status: INITIAL  2.  Pt will identify and implement 2 or more compensatory strategies to enable writing for pastoral work and/or ADL/leisure with reported improved efficiency. Baseline: Eval: Poor efficiency with writing d/t impaired visual scanning/L  superior field deficit; pt struggles to identify page ends and transitioning from one line to the next when writing multiple sentences. Goal status: INITIAL  3.  Pt will demonstrate improved organizational scanning patterns with a paper/pencil task as demonstrated by completion of a line cancellation task with a deficit of 1 or less. 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  Pt reports that his spouse has helped him to read longer scriptures at church, but that he has been able to read his notes and short scriptures so far.  Pt receptive to all education above  re: organized visual scanning methods and external compensatory strategies for reading/writing.  Pt demonstrated improved efficiency with reading/writing without use of line reader, but rather was quicker using his fingers to scan a line and identify L and R ends of each line while accurately skipping to a subsequent line.  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   PERFORMANCE DEFICITS: in functional skills including ADLs, IADLs, sensation, decreased knowledge of use of DME, and vision, cognitive skills including, and psychosocial skills including coping strategies, environmental adaptation, habits, and routines and behaviors.   IMPAIRMENTS: are limiting patient from ADLs, IADLs, work, leisure, and social participation.   CO-MORBIDITIES: has co-morbidities such as DM, HTN that affects occupational performance. Patient will benefit from skilled OT to address above impairments and  improve overall function.  MODIFICATION OR ASSISTANCE TO COMPLETE EVALUATION: No modification of tasks or assist necessary to complete an evaluation.  OT OCCUPATIONAL PROFILE AND HISTORY: Detailed assessment: Review of records and additional review of physical, cognitive, psychosocial history related to current functional performance.  CLINICAL DECISION MAKING: Moderate - several treatment options, min-mod task modification necessary  REHAB POTENTIAL: Good for goals noted above  EVALUATION COMPLEXITY: Moderate    PLAN:  OT FREQUENCY: 1x/week  OT DURATION: up to 12 weeks  PLANNED INTERVENTIONS: 97168 OT Re-evaluation, 97535 self care/ADL training, 02889 therapeutic exercise, 97530 therapeutic activity, 97112 neuromuscular re-education, visual/perceptual remediation/compensation, psychosocial skills training, coping strategies training, patient/family education, and DME and/or AE instructions  RECOMMENDED OTHER SERVICES: None at this time (pt is under the care of neurology and ophthalmology)   CONSULTED AND AGREED WITH PLAN OF CARE: Patient and family member/caregiver  PLAN FOR NEXT SESSION: See above   Inocente Blazing, MS, OTR/L   Inocente MARLA Blazing, OT 08/10/2024, 6:21 PM

## 2024-08-12 ENCOUNTER — Ambulatory Visit
Admission: RE | Admit: 2024-08-12 | Discharge: 2024-08-12 | Disposition: A | Discharge status: Home / Self Care | Source: Ambulatory Visit | Attending: Acute Care | Admitting: Acute Care

## 2024-08-12 DIAGNOSIS — Z87891 Personal history of nicotine dependence: Secondary | ICD-10-CM | POA: Insufficient documentation

## 2024-08-12 DIAGNOSIS — Z122 Encounter for screening for malignant neoplasm of respiratory organs: Secondary | ICD-10-CM | POA: Diagnosis present

## 2024-08-26 ENCOUNTER — Other Ambulatory Visit: Payer: Self-pay | Admitting: Acute Care

## 2024-08-26 DIAGNOSIS — Z87891 Personal history of nicotine dependence: Secondary | ICD-10-CM

## 2024-08-26 DIAGNOSIS — Z122 Encounter for screening for malignant neoplasm of respiratory organs: Secondary | ICD-10-CM

## 2024-08-27 ENCOUNTER — Other Ambulatory Visit: Payer: Self-pay

## 2024-08-27 ENCOUNTER — Encounter: Payer: Self-pay | Admitting: Family Medicine

## 2024-08-27 ENCOUNTER — Ambulatory Visit (INDEPENDENT_AMBULATORY_CARE_PROVIDER_SITE_OTHER): Admitting: Family Medicine

## 2024-08-27 VITALS — BP 158/74 | HR 62 | Temp 97.5°F | Ht 73.0 in | Wt 247.0 lb

## 2024-08-27 DIAGNOSIS — E782 Mixed hyperlipidemia: Secondary | ICD-10-CM

## 2024-08-27 DIAGNOSIS — E0840 Diabetes mellitus due to underlying condition with diabetic neuropathy, unspecified: Secondary | ICD-10-CM

## 2024-08-27 DIAGNOSIS — I6930 Unspecified sequelae of cerebral infarction: Secondary | ICD-10-CM

## 2024-08-27 DIAGNOSIS — I129 Hypertensive chronic kidney disease with stage 1 through stage 4 chronic kidney disease, or unspecified chronic kidney disease: Secondary | ICD-10-CM

## 2024-08-27 DIAGNOSIS — Z23 Encounter for immunization: Secondary | ICD-10-CM

## 2024-08-27 DIAGNOSIS — E291 Testicular hypofunction: Secondary | ICD-10-CM

## 2024-08-27 DIAGNOSIS — E559 Vitamin D deficiency, unspecified: Secondary | ICD-10-CM | POA: Diagnosis not present

## 2024-08-27 DIAGNOSIS — I700 Atherosclerosis of aorta: Secondary | ICD-10-CM

## 2024-08-27 DIAGNOSIS — R3911 Hesitancy of micturition: Secondary | ICD-10-CM

## 2024-08-27 DIAGNOSIS — Z794 Long term (current) use of insulin: Secondary | ICD-10-CM

## 2024-08-27 DIAGNOSIS — I2510 Atherosclerotic heart disease of native coronary artery without angina pectoris: Secondary | ICD-10-CM

## 2024-08-27 DIAGNOSIS — E039 Hypothyroidism, unspecified: Secondary | ICD-10-CM

## 2024-08-27 LAB — MICROALBUMIN, URINE WAIVED
Creatinine, Urine Waived: 100 mg/dL (ref 10–300)
Microalb, Ur Waived: 150 mg/L — ABNORMAL HIGH (ref 0–19)
Microalb/Creat Ratio: >300 mg/g — ABNORMAL HIGH (ref <30)

## 2024-08-27 LAB — BAYER DCA HB A1C WAIVED: HB A1C (BAYER DCA - WAIVED): 6.5 % — ABNORMAL HIGH (ref 4.8–5.6)

## 2024-08-27 MED ORDER — LANCET DEVICE MISC: 1.0000 | Freq: Three times a day (TID) | 0 refills | Status: AC

## 2024-08-27 MED ORDER — AMLODIPINE BESYLATE 10 MG PO TABS: 10.0000 mg | ORAL_TABLET | Freq: Every day | ORAL | 1 refills | Status: AC

## 2024-08-27 MED ORDER — HYDROCHLOROTHIAZIDE 25 MG PO TABS: 25.0000 mg | ORAL_TABLET | Freq: Every day | ORAL | 1 refills | Status: AC

## 2024-08-27 MED ORDER — LOSARTAN POTASSIUM 100 MG PO TABS: 100.0000 mg | ORAL_TABLET | Freq: Every day | ORAL | 1 refills | Status: AC

## 2024-08-27 MED ORDER — BLOOD GLUCOSE TEST VI STRP: 1.0000 | ORAL_STRIP | Freq: Three times a day (TID) | 0 refills | Status: AC

## 2024-08-27 MED ORDER — HYDRALAZINE HCL 100 MG PO TABS: 100.0000 mg | ORAL_TABLET | Freq: Two times a day (BID) | ORAL | 3 refills | Status: DC

## 2024-08-27 MED ORDER — ATORVASTATIN CALCIUM 80 MG PO TABS: 80.0000 mg | ORAL_TABLET | Freq: Every day | ORAL | 1 refills | Status: AC

## 2024-08-27 MED ORDER — PIOGLITAZONE HCL 30 MG PO TABS: 30.0000 mg | ORAL_TABLET | Freq: Every day | ORAL | 1 refills | Status: DC

## 2024-08-27 MED ORDER — FENOFIBRATE 160 MG PO TABS: 160.0000 mg | ORAL_TABLET | Freq: Every day | ORAL | 1 refills | Status: AC

## 2024-08-27 MED ORDER — BLOOD GLUCOSE MONITORING SUPPL DEVI: 1.0000 | Freq: Three times a day (TID) | 0 refills | Status: AC

## 2024-08-27 MED ORDER — TRESIBA FLEXTOUCH 100 UNIT/ML ~~LOC~~ SOPN
40.0000 [IU] | PEN_INJECTOR | Freq: Every day | SUBCUTANEOUS | 1 refills | Status: DC
  Filled 2024-08-27: qty 15, 37d supply, fill #0
  Filled 2024-10-13: qty 15, 37d supply, fill #1

## 2024-08-27 MED ORDER — METFORMIN HCL ER 500 MG PO TB24: ORAL_TABLET | ORAL | 1 refills | Status: AC

## 2024-08-27 MED ORDER — GLIPIZIDE 5 MG PO TABS: 5.0000 mg | ORAL_TABLET | Freq: Every day | ORAL | 1 refills | Status: DC

## 2024-08-27 MED ORDER — PLAVIX 75 MG PO TABS: 75.0000 mg | ORAL_TABLET | Freq: Every day | ORAL | 1 refills | Status: DC

## 2024-08-27 NOTE — Assessment & Plan Note (Signed)
 Still out of control. Will increase his hydralazine  to 100mg  BID and recheck 4-6 weeks. Call with any concerns. Continue to monitor. Labs drawn today.

## 2024-08-27 NOTE — Assessment & Plan Note (Signed)
 Will keep BP, sugar and cholesterol under good control. Labs drawn today. Call with any concerns. Continue to monitor.

## 2024-08-27 NOTE — Progress Notes (Unsigned)
 BP (!) 158/74   Pulse 62   Temp (!) 97.5 F (36.4 C) (Oral)   Ht 6' 1 (1.854 m)   Wt 247 lb (112 kg)   SpO2 95%   BMI 32.59 kg/m    Subjective:    Patient ID: Manuel Burnett, male    DOB: [birth year], 68 y.o.   MRN: [id number]  HPI: Manuel Burnett is a 68 y.o. male  Chief Complaint  Patient presents with   Hypertension   Hyperlipidemia   Hypothyroidism   Diabetes   DIABETES Hypoglycemic episodes:no Polydipsia/polyuria: no Visual disturbance: no Chest pain: no Paresthesias: no Glucose Monitoring: yes  Accucheck frequency: daily  Fasting glucose: 70-127 this AM Taking Insulin ?: yes  Long acting insulin : 40 units tresiba  Blood Pressure Monitoring: not checking Retinal Examination: Up to Date Foot Exam: Up to Date Diabetic Education: Completed Pneumovax: Up to Date Influenza: Up to Date Aspirin : yes  HYPERTENSION / HYPERLIPIDEMIA Satisfied with current treatment? no Duration of hypertension: chronic BP monitoring frequency: daily BP range: 140s-160s BP medication side effects: no Past BP meds: hydrochlorothiazide , losartan , hydralazine , amlodipine  Duration of hyperlipidemia: chronic Cholesterol medication side effects: no Cholesterol supplements: none Past cholesterol medications: atorvastatin  Medication compliance: excellent compliance Aspirin : yes Recent stressors: no Recurrent headaches: no Visual changes: no Palpitations: no Dyspnea: no Chest pain: no Lower extremity edema: no Dizzy/lightheaded: no  HYPOTHYROIDISM Thyroid  control status:controlled Satisfied with current treatment? no Medication side effects: no Medication compliance: excellent compliance Recent dose adjustment:no Fatigue: no Cold intolerance: no Heat intolerance: no Weight gain: no Weight loss: no Constipation: no Diarrhea/loose stools: no Palpitations: no Lower extremity edema: no Anxiety/depressed mood: no   Relevant past medical, surgical, family and social  history reviewed and updated as indicated. Interim medical history since our last visit reviewed. Allergies and medications reviewed and updated.  Review of Systems  Constitutional:  Positive for diaphoresis. Negative for activity change, appetite change, chills, fatigue, fever and unexpected weight change.  HENT:  Positive for tinnitus. Negative for congestion, dental problem, drooling, ear discharge, ear pain, facial swelling, hearing loss, mouth sores, nosebleeds, postnasal drip, rhinorrhea, sinus pressure, sinus pain, sneezing, sore throat, trouble swallowing and voice change.   Eyes:  Positive for visual disturbance. Negative for photophobia, pain, discharge, redness and itching.  Respiratory: Negative.    Cardiovascular:  Positive for leg swelling (R ankle especially, mild). Negative for chest pain and palpitations.  Gastrointestinal: Negative.   Endocrine: Negative.   Genitourinary: Negative.   Musculoskeletal:  Positive for arthralgias and myalgias. Negative for back pain, gait problem, joint swelling, neck pain and neck stiffness.  Skin: Negative.   Allergic/Immunologic: Negative.   Neurological:  Positive for tremors and weakness. Negative for dizziness, seizures, syncope, facial asymmetry, speech difficulty, light-headedness, numbness and headaches.  Hematological: Negative.   Psychiatric/Behavioral: Negative.      Per HPI unless specifically indicated above     Objective:    BP (!) 158/74   Pulse 62   Temp (!) 97.5 F (36.4 C) (Oral)   Ht 6' 1 (1.854 m)   Wt 247 lb (112 kg)   SpO2 95%   BMI 32.59 kg/m   Wt Readings from Last 3 Encounters:  08/27/24 247 lb (112 kg)  08/12/24 241 lb (109.3 kg)  07/02/24 241 lb (109.3 kg)    Physical Exam Vitals and nursing note reviewed.  Constitutional:      General: He is not in acute distress.    Appearance: Normal appearance. He is obese. He  is not ill-appearing, toxic-appearing or diaphoretic.  HENT:     Head:  Normocephalic and atraumatic.     Right Ear: Tympanic membrane, ear canal and external ear normal. There is no impacted cerumen.     Left Ear: Tympanic membrane, ear canal and external ear normal. There is no impacted cerumen.     Nose: Nose normal. No congestion or rhinorrhea.     Mouth/Throat:     Mouth: Mucous membranes are moist.     Pharynx: Oropharynx is clear. No oropharyngeal exudate or posterior oropharyngeal erythema.  Eyes:     General: No scleral icterus.       Right eye: No discharge.        Left eye: No discharge.     Extraocular Movements: Extraocular movements intact.     Conjunctiva/sclera: Conjunctivae normal.     Pupils: Pupils are equal, round, and reactive to light.  Neck:     Vascular: No carotid bruit.  Cardiovascular:     Rate and Rhythm: Normal rate and regular rhythm.     Pulses: Normal pulses.     Heart sounds: No murmur heard.    No friction rub. No gallop.  Pulmonary:     Effort: Pulmonary effort is normal. No respiratory distress.     Breath sounds: Normal breath sounds. No stridor. No wheezing, rhonchi or rales.  Chest:     Chest wall: No tenderness.  Abdominal:     General: Abdomen is flat. Bowel sounds are normal. There is no distension.     Palpations: Abdomen is soft. There is no mass.     Tenderness: There is no abdominal tenderness. There is no right CVA tenderness, left CVA tenderness, guarding or rebound.     Hernia: No hernia is present.  Genitourinary:    Comments: Genital exam deferred with shared decision making Musculoskeletal:        General: No swelling, tenderness, deformity or signs of injury.     Cervical back: Normal range of motion and neck supple. No rigidity. No muscular tenderness.     Right lower leg: No edema.     Left lower leg: No edema.  Lymphadenopathy:     Cervical: No cervical adenopathy.  Skin:    General: Skin is warm and dry.     Capillary Refill: Capillary refill takes less than 2 seconds.     Coloration:  Skin is not jaundiced or pale.     Findings: No bruising, erythema, lesion or rash.  Neurological:     General: No focal deficit present.     Mental Status: He is alert and oriented to person, place, and time.     Cranial Nerves: No cranial nerve deficit.     Sensory: No sensory deficit.     Motor: No weakness.     Coordination: Coordination normal.     Gait: Gait normal.     Deep Tendon Reflexes: Reflexes normal.  Psychiatric:        Mood and Affect: Mood normal.        Behavior: Behavior normal.        Thought Content: Thought content normal.        Judgment: Judgment normal.     Results for orders placed or performed in visit on 08/27/24  Microalbumin, Urine Waived   Collection Time: 08/27/24  8:53 AM  Result Value Ref Range   Microalb, Ur Waived 150 (H) 0 - 19 mg/L   Creatinine, Urine Waived 100 10 - 300 mg/dL  Microalb/Creat Ratio >300 (H) <30 mg/g  Bayer DCA Hb A1c Waived   Collection Time: 08/27/24  8:53 AM  Result Value Ref Range   HB A1C (BAYER DCA - WAIVED) 6.5 (H) 4.8 - 5.6 %      Assessment & Plan:   Problem List Items Addressed This Visit       Cardiovascular and Mediastinum   Coronary artery disease involving native coronary artery of native heart without angina pectoris   Will keep BP, sugar and cholesterol under good control. Labs drawn today. Call with any concerns. Continue to monitor.       Relevant Medications   amLODipine  (NORVASC ) 10 MG tablet   atorvastatin  (LIPITOR) 80 MG tablet   fenofibrate  160 MG tablet   hydrochlorothiazide  (HYDRODIURIL ) 25 MG tablet   losartan  (COZAAR ) 100 MG tablet   hydrALAZINE  (APRESOLINE ) 100 MG tablet   Aortic atherosclerosis (HCC)   Will keep BP, sugar and cholesterol under good control. Labs drawn today. Call with any concerns. Continue to monitor.       Relevant Medications   amLODipine  (NORVASC ) 10 MG tablet   atorvastatin  (LIPITOR) 80 MG tablet   fenofibrate  160 MG tablet   hydrochlorothiazide   (HYDRODIURIL ) 25 MG tablet   losartan  (COZAAR ) 100 MG tablet   hydrALAZINE  (APRESOLINE ) 100 MG tablet     Endocrine   Diabetes mellitus due to underlying condition with diabetic neuropathy (HCC)   Doing much better with A1c of 6.5! Continue current regimen. Continue to monitor. Call with any concerns.       Relevant Medications   atorvastatin  (LIPITOR) 80 MG tablet   glipiZIDE  (GLUCOTROL ) 5 MG tablet   insulin  degludec (TRESIBA  FLEXTOUCH) 100 UNIT/ML FlexTouch Pen   losartan  (COZAAR ) 100 MG tablet   metFORMIN  (GLUCOPHAGE -XR) 500 MG 24 hr tablet   pioglitazone  (ACTOS ) 30 MG tablet   Other Relevant Orders   Comprehensive metabolic panel with GFR   CBC with Differential/Platelet   Microalbumin, Urine Waived (Completed)   Bayer DCA Hb A1c Waived (Completed)   B12   Male hypogonadism   Rechecking labs today. Await results. Treat as needed.       Relevant Orders   Comprehensive metabolic panel with GFR   CBC with Differential/Platelet   PSA   Testosterone , free, total([facility])   Hypothyroidism   Rechecking labs today. Await results. Treat as needed.       Relevant Orders   Comprehensive metabolic panel with GFR   CBC with Differential/Platelet   TSH     Genitourinary   Benign hypertensive renal disease - Primary   Still out of control. Will increase his hydralazine  to 100mg  BID and recheck 4-6 weeks. Call with any concerns. Continue to monitor. Labs drawn today.       Relevant Orders   Comprehensive metabolic panel with GFR   CBC with Differential/Platelet   Microalbumin, Urine Waived (Completed)     Other   Hyperlipemia   Under good control on current regimen. Continue current regimen. Continue to monitor. Call with any concerns. Refills given. Labs drawn today.        Relevant Medications   amLODipine  (NORVASC ) 10 MG tablet   atorvastatin  (LIPITOR) 80 MG tablet   fenofibrate  160 MG tablet   hydrochlorothiazide  (HYDRODIURIL ) 25 MG tablet   losartan   (COZAAR ) 100 MG tablet   hydrALAZINE  (APRESOLINE ) 100 MG tablet   Other Relevant Orders   Comprehensive metabolic panel with GFR   CBC with Differential/Platelet   Lipid Panel w/o Chol/HDL Ratio  History of stroke with residual deficit   Will keep BP, sugar and cholesterol under good control. Continue to follow with neurology. Call with any concerns. Continue to monitor.       Relevant Orders   Comprehensive metabolic panel with GFR   CBC with Differential/Platelet   Vitamin D  deficiency   Rechecking labs today. Await results. Treat as needed.       Relevant Orders   Comprehensive metabolic panel with GFR   CBC with Differential/Platelet   VITAMIN D  25 Hydroxy (Vit-D Deficiency, Fractures)   Other Visit Diagnoses       Hesitancy       Labs drawn today. Await results.   Relevant Orders   PSA     Encounter for immunization       Flu shot given today.   Relevant Orders   Flu vaccine HIGH DOSE PF(Fluzone Trivalent) (Completed)        Follow up plan: Return in about 6 weeks (around 10/08/2024), or 4-6 weeks.

## 2024-08-27 NOTE — Assessment & Plan Note (Signed)
 Rechecking labs today. Await results. Treat as needed.

## 2024-08-27 NOTE — Assessment & Plan Note (Signed)
 Doing much better with A1c of 6.5! Continue current regimen. Continue to monitor. Call with any concerns.

## 2024-08-27 NOTE — Assessment & Plan Note (Signed)
 Will keep BP, sugar and cholesterol under good control. Continue to follow with neurology. Call with any concerns. Continue to monitor.

## 2024-08-27 NOTE — Assessment & Plan Note (Signed)
 Under good control on current regimen. Continue current regimen. Continue to monitor. Call with any concerns. Refills given. Labs drawn today.

## 2024-08-28 ENCOUNTER — Ambulatory Visit: Payer: Self-pay | Admitting: Family Medicine

## 2024-08-28 DIAGNOSIS — Z23 Encounter for immunization: Secondary | ICD-10-CM | POA: Diagnosis not present

## 2024-08-28 MED ORDER — LEVOTHYROXINE SODIUM 50 MCG PO TABS: 50.0000 ug | ORAL_TABLET | Freq: Every day | ORAL | 0 refills | Status: DC

## 2024-08-28 MED ORDER — LEVOTHYROXINE SODIUM 200 MCG PO TABS: 200.0000 ug | ORAL_TABLET | Freq: Every day | ORAL | 0 refills | Status: DC

## 2024-08-28 NOTE — Telephone Encounter (Signed)
 This encounter was created in error - please disregard.  This encounter was created in error - please disregard.

## 2024-08-29 ENCOUNTER — Telehealth: Payer: Self-pay

## 2024-08-29 NOTE — Telephone Encounter (Unsigned)
 Copied from CRM [id number]. Topic: Clinical - Medication Question >> Aug 29, 2024 12:22 PM Winona R wrote: Pt was taking two tyroid medications and pt labs came back that the provider was over treating  so the provider pulled back and the pt is now unsure what dosage he's suppose to take. Please contact the pt to inform him what his current dosage is for Levothyroxine .

## 2024-08-30 NOTE — Telephone Encounter (Signed)
 Discussed with patient. He will now start at the new dose of 250 mcg.

## 2024-08-30 NOTE — Telephone Encounter (Signed)
 Forwarding for PCP to advise. I/Meyhonia can discuss with him after words.

## 2024-08-30 NOTE — Telephone Encounter (Signed)
 250mcg- new Rx has been sent to his pharmacy 1 200mcg pill and 1 50mcg pill

## 2024-09-01 LAB — COMPREHENSIVE METABOLIC PANEL WITH GFR
ALT: 15 IU/L (ref 0–44)
AST: 17 IU/L (ref 0–40)
Albumin: 4.5 g/dL (ref 3.9–4.9)
Alkaline Phosphatase: 48 IU/L (ref 44–121)
BUN/Creatinine Ratio: 16 (ref 10–24)
BUN: 20 mg/dL (ref 8–27)
Bilirubin Total: 0.4 mg/dL (ref 0.0–1.2)
CO2: 26 mmol/L (ref 20–29)
Calcium: 10.2 mg/dL (ref 8.6–10.2)
Chloride: 104 mmol/L (ref 96–106)
Creatinine, Ser: 1.29 mg/dL — ABNORMAL HIGH (ref 0.76–1.27)
Globulin, Total: 2.6 g/dL (ref 1.5–4.5)
Glucose: 51 mg/dL — ABNORMAL LOW (ref 70–99)
Potassium: 3.7 mmol/L (ref 3.5–5.2)
Sodium: 143 mmol/L (ref 134–144)
Total Protein: 7.1 g/dL (ref 6.0–8.5)
eGFR: 61 mL/min/1.73 (ref >59)

## 2024-09-01 LAB — CBC WITH DIFFERENTIAL/PLATELET
Basophils Absolute: 0.1 x10E3/uL (ref 0.0–0.2)
Basos: 1 %
EOS (ABSOLUTE): 0.4 x10E3/uL (ref 0.0–0.4)
Eos: 5 %
Hematocrit: 38.6 % (ref 37.5–51.0)
Hemoglobin: 12.8 g/dL — ABNORMAL LOW (ref 13.0–17.7)
Immature Grans (Abs): 0 x10E3/uL (ref 0.0–0.1)
Immature Granulocytes: 0 %
Lymphocytes Absolute: 1.4 x10E3/uL (ref 0.7–3.1)
Lymphs: 18 %
MCH: 29.8 pg (ref 26.6–33.0)
MCHC: 33.2 g/dL (ref 31.5–35.7)
MCV: 90 fL (ref 79–97)
Monocytes Absolute: 0.9 x10E3/uL (ref 0.1–0.9)
Monocytes: 12 %
Neutrophils Absolute: 4.9 x10E3/uL (ref 1.4–7.0)
Neutrophils: 64 %
Platelets: 202 x10E3/uL (ref 150–450)
RBC: 4.29 x10E6/uL (ref 4.14–5.80)
RDW: 12.8 % (ref 11.6–15.4)
WBC: 7.6 x10E3/uL (ref 3.4–10.8)

## 2024-09-01 LAB — TESTOSTERONE, FREE, TOTAL, SHBG
Sex Hormone Binding: 63.6 nmol/L (ref 19.3–76.4)
Testosterone, Free: 3 pg/mL — AB (ref 6.6–18.1)
Testosterone: 420 ng/dL (ref 264–916)

## 2024-09-01 LAB — LIPID PANEL W/O CHOL/HDL RATIO
Cholesterol, Total: 142 mg/dL (ref 100–199)
HDL: 42 mg/dL (ref >39)
LDL Chol Calc (NIH): 68 mg/dL (ref 0–99)
Triglycerides: 192 mg/dL — ABNORMAL HIGH (ref 0–149)
VLDL Cholesterol Cal: 32 mg/dL (ref 5–40)

## 2024-09-01 LAB — PSA: Prostate Specific Ag, Serum: 1.9 ng/mL (ref 0.0–4.0)

## 2024-09-01 LAB — VITAMIN B12: Vitamin B-12: 587 pg/mL (ref 232–1245)

## 2024-09-01 LAB — VITAMIN D 25 HYDROXY (VIT D DEFICIENCY, FRACTURES): Vit D, 25-Hydroxy: 31.3 ng/mL (ref 30.0–100.0)

## 2024-09-01 LAB — TSH: TSH: 0.042 u[IU]/mL — ABNORMAL LOW (ref 0.450–4.500)

## 2024-09-02 ENCOUNTER — Ambulatory Visit: Payer: Self-pay

## 2024-09-02 NOTE — Telephone Encounter (Signed)
 FYI Only or Action Required?: Pt started to experience these symptoms after increasing dosage of hydralazine  on Saturday night  - pt is wondering if dose needs adjusting. He is hearing his heart beat in his ears - mostly left side. Should pt take the evening dosage of this medication?  Patient was last seen in primary care on 08/27/2024 by Vicci Duwaine SQUIBB, DO.  Called Nurse Triage reporting  Fatigue, Can hear heartbeat in ears bp 141/68.  Symptoms began Saturday.  Interventions attempted: Nothing.  Symptoms are: unchanged.  Triage Disposition: See PCP When Office is Open (Within 3 Days)             Patient/caregiver understands and will follow disposition?: YesCopied from CRM #[id number]. Topic: Clinical - Red Word Triage >> Sep 02, 2024  1:00 PM Winona R wrote: Pt has some concerns about his new meds. Pt states it makes him very sleepy and has been in the bed sleep until about 12:30 BP is 141/60. Pt has a lot of discomfort in his ear and feels a throbbing feeling in his ears. Reason for Disposition  Systolic BP >= 160 OR Diastolic >= 100  Answer Assessment - Initial Assessment Questions 1. LOCATION: Which ear is involved?     Left ear - rt is a little painful as well  2. ONSET: When did the ear pain start?      *No Answer* 3. SEVERITY: How bad is the pain?  (Scale 1-10; mild, moderate or severe)     *No Answer* 4. URI SYMPTOMS: Do you have a runny nose or cough?     *No Answer* 5. FEVER: Do you have a fever? If Yes, ask: What is your temperature, how was it measured, and when did it start?     *No Answer* 6. CAUSE: Have you been swimming recently?, How often do you use Q-TIPS?, Have you had any recent air travel or scuba diving?     *No Answer* 7. OTHER SYMPTOMS: Do you have any other symptoms? (e.g., decreased hearing, dizziness, headache, stiff neck, vomiting)     *No Answer*  Answer Assessment - Initial Assessment Questions Pt started to experience  these symptoms after increasing dosage of hydralazine  on Saturday night  - pt is wondering if dose needs adjusting. He is hearing his heart beat in his ears - mostly left side. Should pt take the evening dosage of this medication?     1. BLOOD PRESSURE: What is your blood pressure? Did you take at least two measurements 5 minutes apart?     141/68 2. ONSET: When did you take your blood pressure?     20 minutes ago 3. HOW: How did you take your blood pressure? (e.g., automatic home BP monitor, visiting nurse)     home 4. HISTORY: Do you have a history of high blood pressure?     yes 5. MEDICINES: Are you taking any medicines for blood pressure? Have you missed any doses recently?     Yes  -  has not missed any medications. Recently increased medication. 6. OTHER SYMPTOMS: Do you have any symptoms? (e.g., blurred vision, chest pain, difficulty breathing, headache, weakness)     Hears heart in his ears.  Protocols used: Earache-A-AH, Blood Pressure - High-A-AH

## 2024-09-02 NOTE — Telephone Encounter (Signed)
 FYI to provider. Appointment scheduled for tomorrow.

## 2024-09-03 ENCOUNTER — Ambulatory Visit (INDEPENDENT_AMBULATORY_CARE_PROVIDER_SITE_OTHER): Admitting: Family Medicine

## 2024-09-03 ENCOUNTER — Encounter: Payer: Self-pay | Admitting: Family Medicine

## 2024-09-03 VITALS — BP 133/62 | HR 64 | Ht 73.0 in | Wt 251.0 lb

## 2024-09-03 DIAGNOSIS — R5383 Other fatigue: Secondary | ICD-10-CM | POA: Diagnosis not present

## 2024-09-03 DIAGNOSIS — E162 Hypoglycemia, unspecified: Secondary | ICD-10-CM

## 2024-09-03 NOTE — Progress Notes (Signed)
 BP 133/62 (BP Location: Left Arm, Patient Position: Sitting)   Pulse 64   Ht 6' 1 (1.854 m)   Wt 251 lb (113.9 kg)   SpO2 95%   BMI 33.12 kg/m    Subjective:    Patient ID: Manuel Burnett, male    DOB: [birth year], 68 y.o.   MRN: [id number]  HPI: Manuel Burnett is a 68 y.o. male  Chief Complaint  Patient presents with   elevated heart rate     Sugar dropped to 63 while sleeping, had shakes and had to drink orange juice. Has cut back on the insulin  to 30 units    Fatigue   DIABETES Hypoglycemic episodes:yes x1 yesterday Polydipsia/polyuria: no Visual disturbance: yes Chest pain: no Paresthesias: no Glucose Monitoring: yes  Accucheck frequency: TID  Fasting glucose: 96 yesterday, 91 today  Before meals: 63 yesterday Taking Insulin ?: yes  Long acting insulin : 30 units daily Blood Pressure Monitoring: not checking Retinal Examination: Up to Date Foot Exam: Up to Date Diabetic Education: Completed Pneumovax: Up to Date Influenza: Up to Date Aspirin : yes  Relevant past medical, surgical, family and social history reviewed and updated as indicated. Interim medical history since our last visit reviewed. Allergies and medications reviewed and updated.  Review of Systems  Constitutional: Negative.   HENT:  Positive for tinnitus. Negative for congestion, dental problem, drooling, ear discharge, ear pain, facial swelling, hearing loss, mouth sores, nosebleeds, postnasal drip, rhinorrhea, sinus pressure, sinus pain, sneezing, sore throat, trouble swallowing and voice change.   Respiratory: Negative.    Cardiovascular: Negative.   Psychiatric/Behavioral: Negative.      Per HPI unless specifically indicated above     Objective:    BP 133/62 (BP Location: Left Arm, Patient Position: Sitting)   Pulse 64   Ht 6' 1 (1.854 m)   Wt 251 lb (113.9 kg)   SpO2 95%   BMI 33.12 kg/m   Wt Readings from Last 3 Encounters:  09/03/24 251 lb (113.9 kg)  08/27/24 247 lb (112  kg)  08/12/24 241 lb (109.3 kg)    Physical Exam Vitals and nursing note reviewed.  Constitutional:      General: He is not in acute distress.    Appearance: Normal appearance. He is not ill-appearing, toxic-appearing or diaphoretic.  HENT:     Head: Normocephalic and atraumatic.     Right Ear: External ear normal.     Left Ear: External ear normal.     Nose: Nose normal.     Mouth/Throat:     Mouth: Mucous membranes are moist.     Pharynx: Oropharynx is clear.  Eyes:     General: No scleral icterus.       Right eye: No discharge.        Left eye: No discharge.     Extraocular Movements: Extraocular movements intact.     Conjunctiva/sclera: Conjunctivae normal.     Pupils: Pupils are equal, round, and reactive to light.  Cardiovascular:     Rate and Rhythm: Normal rate and regular rhythm.     Pulses: Normal pulses.     Heart sounds: Normal heart sounds. No murmur heard.    No friction rub. No gallop.  Pulmonary:     Effort: Pulmonary effort is normal. No respiratory distress.     Breath sounds: Normal breath sounds. No stridor. No wheezing, rhonchi or rales.  Chest:     Chest wall: No tenderness.  Musculoskeletal:  General: Normal range of motion.     Cervical back: Normal range of motion and neck supple.  Skin:    General: Skin is warm and dry.     Capillary Refill: Capillary refill takes less than 2 seconds.     Coloration: Skin is not jaundiced or pale.     Findings: No bruising, erythema, lesion or rash.  Neurological:     General: No focal deficit present.     Mental Status: He is alert and oriented to person, place, and time. Mental status is at baseline.  Psychiatric:        Mood and Affect: Mood normal.        Behavior: Behavior normal.        Thought Content: Thought content normal.        Judgment: Judgment normal.     Results for orders placed or performed in visit on 08/27/24  Microalbumin, Urine Waived   Collection Time: 08/27/24  8:53 AM   Result Value Ref Range   Microalb, Ur Waived 150 (H) 0 - 19 mg/L   Creatinine, Urine Waived 100 10 - 300 mg/dL   Microalb/Creat Ratio >300 (H) <30 mg/g  Bayer DCA Hb A1c Waived   Collection Time: 08/27/24  8:53 AM  Result Value Ref Range   HB A1C (BAYER DCA - WAIVED) 6.5 (H) 4.8 - 5.6 %  Comprehensive metabolic panel with GFR   Collection Time: 08/27/24  9:02 AM  Result Value Ref Range   Glucose 51 (L) 70 - 99 mg/dL   BUN 20 8 - 27 mg/dL   Creatinine, Ser 8.70 (H) 0.76 - 1.27 mg/dL   eGFR 61 >40 fO/fpw/8.26   BUN/Creatinine Ratio 16 10 - 24   Sodium 143 134 - 144 mmol/L   Potassium 3.7 3.5 - 5.2 mmol/L   Chloride 104 96 - 106 mmol/L   CO2 26 20 - 29 mmol/L   Calcium  10.2 8.6 - 10.2 mg/dL   Total Protein 7.1 6.0 - 8.5 g/dL   Albumin 4.5 3.9 - 4.9 g/dL   Globulin, Total 2.6 1.5 - 4.5 g/dL   Bilirubin Total 0.4 0.0 - 1.2 mg/dL   Alkaline Phosphatase 48 44 - 121 IU/L   AST 17 0 - 40 IU/L   ALT 15 0 - 44 IU/L  CBC with Differential/Platelet   Collection Time: 08/27/24  9:02 AM  Result Value Ref Range   WBC 7.6 3.4 - 10.8 x10E3/uL   RBC 4.29 4.14 - 5.80 x10E6/uL   Hemoglobin 12.8 (L) 13.0 - 17.7 g/dL   Hematocrit 61.3 62.4 - 51.0 %   MCV 90 79 - 97 fL   MCH 29.8 26.6 - 33.0 pg   MCHC 33.2 31.5 - 35.7 g/dL   RDW 87.1 88.3 - 84.5 %   Platelets 202 150 - 450 x10E3/uL   Neutrophils 64 Not Estab. %   Lymphs 18 Not Estab. %   Monocytes 12 Not Estab. %   Eos 5 Not Estab. %   Basos 1 Not Estab. %   Neutrophils Absolute 4.9 1.4 - 7.0 x10E3/uL   Lymphocytes Absolute 1.4 0.7 - 3.1 x10E3/uL   Monocytes Absolute 0.9 0.1 - 0.9 x10E3/uL   EOS (ABSOLUTE) 0.4 0.0 - 0.4 x10E3/uL   Basophils Absolute 0.1 0.0 - 0.2 x10E3/uL   Immature Granulocytes 0 Not Estab. %   Immature Grans (Abs) 0.0 0.0 - 0.1 x10E3/uL  Lipid Panel w/o Chol/HDL Ratio   Collection Time: 08/27/24  9:02 AM  Result  Value Ref Range   Cholesterol, Total 142 100 - 199 mg/dL   Triglycerides 807 (H) 0 - 149 mg/dL   HDL  42 >60 mg/dL   VLDL Cholesterol Cal 32 5 - 40 mg/dL   LDL Chol Calc (NIH) 68 0 - 99 mg/dL  PSA   Collection Time: 08/27/24  9:02 AM  Result Value Ref Range   Prostate Specific Ag, Serum 1.9 0.0 - 4.0 ng/mL  TSH   Collection Time: 08/27/24  9:02 AM  Result Value Ref Range   TSH 0.042 (L) 0.450 - 4.500 uIU/mL  Testosterone , free, total([facility])   Collection Time: 08/27/24  9:02 AM  Result Value Ref Range   Testosterone  420 264 - 916 ng/dL   Testosterone , Free 3.0 (L) 6.6 - 18.1 pg/mL   Sex Hormone Binding 63.6 19.3 - 76.4 nmol/L  B12   Collection Time: 08/27/24  9:02 AM  Result Value Ref Range   Vitamin B-12 587 232 - 1,245 pg/mL  VITAMIN D  25 Hydroxy (Vit-D Deficiency, Fractures)   Collection Time: 08/27/24  9:02 AM  Result Value Ref Range   Vit D, 25-Hydroxy 31.3 30.0 - 100.0 ng/mL      Assessment & Plan:   Problem List Items Addressed This Visit   None Visit Diagnoses       Hypoglycemia    -  Primary   Will stop his glipizide  and continue his tresiba . Will get him on CGM and get pharmacy involved. Call with any concerns. Continue to monitor.     Fatigue, unspecified type       EKG normal. Will manage sugars. Call with any concerns. Follow up 1 month.   Relevant Orders   EKG 12-Lead        Follow up plan: Return for As scheduled.

## 2024-09-05 ENCOUNTER — Telehealth: Payer: Self-pay

## 2024-09-05 NOTE — Progress Notes (Signed)
   09/05/2024  Patient ID: Manuel Burnett, male   DOB: [birth year], 68 y.o.   MRN: [id number]  Subjective/Objective Telephone visit to follow-up on management diabetes   Diabetes: Current medications: Tresiba  30 units daily, metformin  XR 1000mg  BID, pioglitazone  30mg  daily -Medications tried in the past: Ozempic  and Trulicity  caused skin rash, Jardiance  and Mounjaro  were not affordable  -Patient had recent occurrence of symptomatic hypoglycemia during the night, so glipizide  was recently stopped by PCP; and he has decreased Tresiba  to 30 units from 40 units -PCP would like to initiate CGM -A1c 6.5% 9/9   Assessment/Plan:    Diabetes: -Currently controlled based on A1c -Continue current regimen at this time and  regular monitoring of FBG -Test claim reflects Medicare B will cover CGM, so I am sending an order for [facility] 3 Reader and 3+ sensors (under [facility] standing order for CGM) via [employer] to DME supplier; advised patient they will call to verify insurance information and address as well as discuss any copay associated -I will follow-up with patient Monday; and if CGM is received before Wednesday, we will have an in person visit at [facility] to set up  Follow Up Plan: 9/22   Manuel Burnett, PharmD, DPLA

## 2024-09-09 ENCOUNTER — Telehealth: Payer: Self-pay

## 2024-09-09 NOTE — Progress Notes (Signed)
   09/09/2024  Patient ID: Manuel Burnett, male   DOB: 1956/01/15, 68 y.o.   MRN: [id number]  Patient outreach to verify [name] has received [name] and 3+ Sensors through [place] (via [place]), and these were delivered yesterday.  Patient is scheduled to come in and see me Wednesday 9/24 at 830am to assist with set up.  [name], PharmD, DPLA

## 2024-09-11 ENCOUNTER — Ambulatory Visit (INDEPENDENT_AMBULATORY_CARE_PROVIDER_SITE_OTHER)

## 2024-09-11 DIAGNOSIS — E0840 Diabetes mellitus due to underlying condition with diabetic neuropathy, unspecified: Secondary | ICD-10-CM

## 2024-09-11 DIAGNOSIS — Z794 Long term (current) use of insulin: Secondary | ICD-10-CM

## 2024-09-11 NOTE — Progress Notes (Signed)
 09/11/2024 Name: Manuel Burnett MRN: [id number] DOB: 03-05-1956  Chief Complaint  Patient presents with   Diabetes Management Plan   Manuel Burnett is a 68 y.o. year old male who was referred for medication management by their primary care provider, Vicci Bouchard P, DO. They presented for a face to face visit today.   They were referred to the pharmacist by their PCP for assistance in managing diabetes   Subjective:  Care Team: Primary Care Provider: Vicci Bouchard SQUIBB, DO ; Next Scheduled Visit: 10/15/2024  Medication Access/Adherence  Current Pharmacy:  [facility] #[id number] - [place], [place] - [address] AT [facility] [address] [place] [address] Phone: [phone] Fax: [phone]  [facility] [id number] [place], [place] - [address] [place] [address] Phone: [phone] Fax: [phone]  [facility] - [facility] [address] [place] [address] Phone: [phone] Fax: [phone]  Patient reports affordability concerns with their medications: No  Patient reports access/transportation concerns to their pharmacy: No  Patient reports adherence concerns with their medications:  No    Diabetes: Current medications: pioglitazone  30mg  daily, metformin  XR 1000mg  BID, Tresiba  40 units at bedtime -Medications tried in the past: glipizide  recently stopped due to hypoglycemia, did not tolerate semaglutide  or dulaglutide  in the past -Current glucose readings: FBG this morning 109 -Recent occurrence of hypoglycemia where BG dropped to 63 during the night and patient did experience symptoms of hypoglycemia and had to drink orange juice to bring BG back up to normal range -Patient presented today with [facility] 3 Reader and 3+ sensors for assistance with set up and consultation on use  Macrovascular and Microvascular Risk Reduction:  Statin? yes (atorvastatin  20mg  daily  ); ACEi/ARB? yes (losartan  100mg  daily) Last urinary albumin/creatinine ratio:  Lab Results  Component Value Date   MICRALBCREAT >300 (H) 08/27/2024   MICRALBCREAT >300 (H) 04/21/2023   MICRALBCREAT >300 (H) 12/05/2022   MICRALBCREAT >300 (H) 11/10/2021   MICRALBCREAT 30-300 (H) 06/15/2020   MICRALBCREAT 30-300 (H) 08/08/2019   MICRALBCREAT 30-300 (H) 07/09/2018   MICRALBCREAT <30 04/03/2017   MICRALBCREAT 30-300 (H) 08/08/2016   MICRALBCREAT <30 02/02/2016   MICRALBCREAT 30-300 (H) 08/03/2015   Last eye exam:  Lab Results  Component Value Date   HMDIABEYEEXA No Retinopathy 05/09/2024   Last foot exam: 06/17/2024 Tobacco Use:  Tobacco Use: Medium Risk (09/03/2024)   Patient History    Smoking Tobacco Use: Former    Smokeless Tobacco Use: Never    Passive Exposure: Not on file   Objective: Lab Results  Component Value Date   HGBA1C 6.5 (H) 08/27/2024   Lab Results  Component Value Date   CREATININE 1.29 (H) 08/27/2024   BUN 20 08/27/2024   NA 143 08/27/2024   K 3.7 08/27/2024   CL 104 08/27/2024   CO2 26 08/27/2024   Lab Results  Component Value Date   CHOL 142 08/27/2024   HDL 42 08/27/2024   LDLCALC 68 08/27/2024   TRIG 192 (H) 08/27/2024   Assessment/Plan:   Diabetes: -Currently controlled; goal A1c <7%. Cardiorenal risk reduction is opportunities for improvement.. Blood pressure is at goal <130/80. LDL is at goal.  -Continue current regimen at this time -Libre 3 mobile app set up, and I am sending patient an email invite to link data to [facility] LibreView dashboard -Libre 3+ sensor placed, and patient counseled on mobile app  and reader use as well as changing of sensor.  Advised patient not to exceed 500mg  of Vitamin C daily to prevent false high readings.  Try to avoid consistent pressure on area where sensor is placed to avoid false low readings.  Advised when to use fingersticks to verify CGM readings. -Consider SGLT2 in the future based on recent UACR; this  could also help with blood pressure control, and we could see if patient would qualify for PAP  Follow Up Plan: 10/9  [name], PharmD, DPLA

## 2024-09-18 ENCOUNTER — Telehealth: Payer: Self-pay

## 2024-09-18 NOTE — Progress Notes (Signed)
   09/18/2024  Patient ID: Manuel Burnett, male   DOB: [birth year], 68 y.o.   MRN: [id number]  Returning missed call/voicemail from patient in regard to [facility] 3+ sensor.  Patient has noticed a couple of variances between sensor readings and fingersticks.  Advised that fingersticks are the most accurate, as there is a lag time with sensor readings since these are coming from the serum versus blood.  Patient did have a low this morning of 63 according to his [place], but fingerstick reflected 124.  Patient did not have s/sx of hypoglycemia, and BG returned to normal limits.  Advised patient to decrease Tresiba  to 30 units at bedtime if BG is <180 and 26 units if <150.  Telephone visit already scheduled for next Thursday.  [name], PharmD, DPLA

## 2024-09-25 NOTE — Progress Notes (Unsigned)
 09/26/2024 Name: Manuel Burnett MRN: [id number] DOB: 31-Jul-1956  Manuel Burnett is a 68 y.o. year old male who was referred for medication management by their primary care provider, Vicci Bouchard P, DO. They presented for a telephone follow-up visit.   They were referred to the pharmacist by their PCP for assistance in managing diabetes   Subjective:  Care Team: Primary Care Provider: Vicci Bouchard SQUIBB, DO ; Next Scheduled Visit: 10/15/2024  Medication Access/Adherence  Current Pharmacy:  [facility] #[id number] [place], [place] - [address] AT [facility] [address] [place] [address] Phone: [phone] Fax: [phone]  [facility] [id number] [place], [place] - [address] [place] [address] Phone: [phone] Fax: [phone]  [facility] - [facility] [address] [place] [address] Phone: [phone] Fax: [phone]  Patient reports affordability concerns with their medications: No  Patient reports access/transportation concerns to their pharmacy: No  Patient reports adherence concerns with their medications:  No    Diabetes: Current medications: pioglitazone  30mg  daily, metformin  XR 1000mg  BID, Tresiba  40 units at bedtime -Medications tried in the past: glipizide  recently stopped due to hypoglycemia, did not tolerate semaglutide  or dulaglutide  in the past.  Prescribed Jardiance  in the past but notes state medication was not affordable. -Patient is using Libre 3+ for CGM and replaced sensor this morning- I have sent a link to share data with [facility] LibreView Dashboard -Patient states the past 2 nights his BG was 120, so he did not use any Tresiba ; and the following mornings his BG was <100 (but >70)  Macrovascular and Microvascular Risk Reduction:  Statin? yes (atorvastatin  20mg  daily ); ACEi/ARB? yes (losartan  100mg  daily) Last urinary albumin/creatinine ratio:   Lab Results  Component Value Date   MICRALBCREAT >300 (H) 08/27/2024   MICRALBCREAT >300 (H) 04/21/2023   MICRALBCREAT >300 (H) 12/05/2022   MICRALBCREAT >300 (H) 11/10/2021   MICRALBCREAT 30-300 (H) 06/15/2020   MICRALBCREAT 30-300 (H) 08/08/2019   MICRALBCREAT 30-300 (H) 07/09/2018   MICRALBCREAT <30 04/03/2017   MICRALBCREAT 30-300 (H) 08/08/2016   MICRALBCREAT <30 02/02/2016   MICRALBCREAT 30-300 (H) 08/03/2015   Last eye exam:  Lab Results  Component Value Date   HMDIABEYEEXA No Retinopathy 05/09/2024   Last foot exam: 06/17/2024 Tobacco Use:  Tobacco Use: Medium Risk (09/03/2024)   Patient History    Smoking Tobacco Use: Former    Smokeless Tobacco Use: Never    Passive Exposure: Not on file   Objective: Lab Results  Component Value Date   HGBA1C 6.5 (H) 08/27/2024   Lab Results  Component Value Date   CREATININE 1.29 (H) 08/27/2024   BUN 20 08/27/2024   NA 143 08/27/2024   K 3.7 08/27/2024   CL 104 08/27/2024   CO2 26 08/27/2024   Lab Results  Component Value Date   CHOL 142 08/27/2024   HDL 42 08/27/2024   LDLCALC 68 08/27/2024   TRIG 192 (H) 08/27/2024   Assessment/Plan:   Diabetes: -Currently controlled; goal A1c <7%. Cardiorenal risk reduction is opportunities for improvement.. Blood pressure is at goal <130/80. LDL is at goal.  -Advised patient to go ahead and hold Tresiba  if BG <120, and administer 20 units if >120 -Continue Libre 3+ for CGM- sending another invite to link data -Consider SGLT2 in the future based on recent UACR; this could also help with blood pressure control, and we  could see if patient would qualify for PAP.    Follow Up Plan: 2 weeks  [name], PharmD, DPLA

## 2024-09-26 ENCOUNTER — Other Ambulatory Visit: Payer: Self-pay

## 2024-09-26 DIAGNOSIS — E782 Mixed hyperlipidemia: Secondary | ICD-10-CM

## 2024-09-26 DIAGNOSIS — Z794 Long term (current) use of insulin: Secondary | ICD-10-CM

## 2024-09-26 DIAGNOSIS — I129 Hypertensive chronic kidney disease with stage 1 through stage 4 chronic kidney disease, or unspecified chronic kidney disease: Secondary | ICD-10-CM

## 2024-09-27 ENCOUNTER — Telehealth: Payer: Self-pay

## 2024-09-27 NOTE — Progress Notes (Signed)
   09/27/2024  Patient ID: Manuel Burnett, male   DOB: [birth year], 68 y.o.   MRN: [id number]  Returning missed call/voicemail from patient stating he is having trouble with his Libre 3+ sensor.  Patient paced a new sensor yesterday morning (more than 24 hours ago), and is consistently getting incorrect readings varying by >50 points compared to fingersticks.  Advised patient that more broad variances are normal especially in the first 24 hours of changing sensors, but I would not expect these at this point.  Patient will change sensor, and I am requesting a replacement form [employer].  Patient also has not been able to successfully link data to to [facility] LibreView Dashboard, so I will see him in person after his visit with Dr. Vicci 10/28 to assist.  [name], PharmD, DPLA

## 2024-10-01 ENCOUNTER — Other Ambulatory Visit

## 2024-10-10 ENCOUNTER — Other Ambulatory Visit: Payer: Self-pay

## 2024-10-10 DIAGNOSIS — E0840 Diabetes mellitus due to underlying condition with diabetic neuropathy, unspecified: Secondary | ICD-10-CM

## 2024-10-10 NOTE — Progress Notes (Signed)
   10/10/2024  Patient ID: Manuel Burnett, male   DOB: 1956-04-18, 68 y.o.   MRN: [id number]

## 2024-10-10 NOTE — Progress Notes (Signed)
   10/10/2024 Name: Manuel Burnett MRN: [id number] DOB: [birth year]  Manuel Burnett is a 68 y.o. year old male who was referred for medication management by their primary care provider, Vicci Bouchard P, DO. They presented for a telephone follow-up visit.   They were referred to the pharmacist by their PCP for assistance in managing diabetes   Subjective:  Care Team: Primary Care Provider: Vicci Bouchard SQUIBB, DO ; Next Scheduled Visit: 10/15/2024  Diabetes: Current medications: Tresiba  30 units if bedtime BG <150, 40 units if bedtime BG >150, pioglitazone  30mg  daily, metformin  XR 1000mg  BID -Patient received the [facility] sensor replacement from manufacturer and new sensor is working well  -Medications tried in the past: glipizide  recently stopped due to hypoglycemia, did not tolerate semaglutide  or dulaglutide  in the past.  Prescribed Jardiance  in the past but notes state medication was not affordable.  Macrovascular and Microvascular Risk Reduction:  Statin? yes (atorvastatin  20mg  daily ); ACEi/ARB? yes (losartan  100mg  daily) Last urinary albumin/creatinine ratio:  Lab Results  Component Value Date   MICRALBCREAT >300 (H) 08/27/2024   MICRALBCREAT >300 (H) 04/21/2023   MICRALBCREAT >300 (H) 12/05/2022   MICRALBCREAT >300 (H) 11/10/2021   MICRALBCREAT 30-300 (H) 06/15/2020   MICRALBCREAT 30-300 (H) 08/08/2019   MICRALBCREAT 30-300 (H) 07/09/2018   MICRALBCREAT <30 04/03/2017   MICRALBCREAT 30-300 (H) 08/08/2016   MICRALBCREAT <30 02/02/2016   MICRALBCREAT 30-300 (H) 08/03/2015   Last eye exam:  Lab Results  Component Value Date   HMDIABEYEEXA No Retinopathy 05/09/2024   Last foot exam: 06/17/2024 Tobacco Use:  Tobacco Use: Medium Risk (09/03/2024)   Patient History    Smoking Tobacco Use: Former    Smokeless Tobacco Use: Never    Passive Exposure: Not on file   Objective: Lab Results  Component Value Date   HGBA1C 6.5 (H) 08/27/2024   Lab Results  Component Value  Date   CREATININE 1.29 (H) 08/27/2024   BUN 20 08/27/2024   NA 143 08/27/2024   K 3.7 08/27/2024   CL 104 08/27/2024   CO2 26 08/27/2024   Lab Results  Component Value Date   CHOL 142 08/27/2024   HDL 42 08/27/2024   LDLCALC 68 08/27/2024   TRIG 192 (H) 08/27/2024   Assessment/Plan:   Diabetes: -Currently controlled; goal A1c <7%. Cardiorenal risk reduction is opportunities for improvement.. Blood pressure is at goal <130/80. LDL is at goal.  -Continue current regimen at this time -Continue Libre 3+ for CGM -Consider SGLT2 in the future based on recent UACR; this could also help with blood pressure control, and we could see if patient would qualify for PAP.    Follow Up Plan: 1 week to assist with linking Libre data to [facility] LibreView Dashboard  Andriette KYM Cleaves [facility] PharmD Candidate Class of 2026   [name], PharmD, DPLA

## 2024-10-13 ENCOUNTER — Other Ambulatory Visit: Payer: Self-pay

## 2024-10-14 ENCOUNTER — Telehealth: Payer: Self-pay

## 2024-10-14 DIAGNOSIS — Z794 Long term (current) use of insulin: Secondary | ICD-10-CM

## 2024-10-14 NOTE — Progress Notes (Signed)
   10/14/2024  Patient ID: Donnice LELON Ehlers, male   DOB: 1956-06-28, 68 y.o.   MRN: [id number]  Incoming call for medication advice.  Patient states after eating lunch approximately 30 minutes ago his BG increased to >250.  Patient had 3 hotdogs on buns with relish and mustard along with some chips.  BG does not typically go this high after eating, so patient inquiring if he should take some Tresiba  or a 5mg  glipizide  tablet he still has at home (recently stopped).  Glipizide  tablets too small to split, and BG had come down to 220's during conversation; so I advised not to take glipizide .  Insulin  would not be quick acting, so I also advised to not take insulin  at this time.  Patient will drink water and see if blood glucose continues to decrease.  Informed patient that carbohydrates in the 3 hotdog buns along with chips likely led to this elevated post-prandial reading.  [name], PharmD, DPLA

## 2024-10-15 ENCOUNTER — Other Ambulatory Visit: Payer: Self-pay

## 2024-10-15 ENCOUNTER — Ambulatory Visit (INDEPENDENT_AMBULATORY_CARE_PROVIDER_SITE_OTHER): Admitting: Family Medicine

## 2024-10-15 ENCOUNTER — Encounter: Payer: Self-pay | Admitting: Family Medicine

## 2024-10-15 VITALS — BP 133/68 | HR 65 | Ht 73.0 in | Wt 249.2 lb

## 2024-10-15 DIAGNOSIS — F4321 Adjustment disorder with depressed mood: Secondary | ICD-10-CM

## 2024-10-15 DIAGNOSIS — I6930 Unspecified sequelae of cerebral infarction: Secondary | ICD-10-CM | POA: Diagnosis not present

## 2024-10-15 DIAGNOSIS — Z794 Long term (current) use of insulin: Secondary | ICD-10-CM

## 2024-10-15 DIAGNOSIS — E0840 Diabetes mellitus due to underlying condition with diabetic neuropathy, unspecified: Secondary | ICD-10-CM

## 2024-10-15 DIAGNOSIS — E039 Hypothyroidism, unspecified: Secondary | ICD-10-CM | POA: Diagnosis not present

## 2024-10-15 NOTE — Progress Notes (Signed)
 BP 133/68   Pulse 65   Ht 6' 1 (1.854 m)   Wt 249 lb 3.2 oz (113 kg)   SpO2 95%   BMI 32.88 kg/m    Subjective:    Patient ID: Manuel Burnett, male    DOB: 1956/01/11, 68 y.o.   MRN: [id number]  HPI: Manuel Burnett is a 68 y.o. male  Chief Complaint  Patient presents with   Fatigue   Diabetes    Bgl @ home yesterday 250 after eating Sunday 250 after eating    DIABETES Hypoglycemic episodes:yes -at night down to 60s- none recently Polydipsia/polyuria: no Visual disturbance: yes Chest pain: no Paresthesias: no Glucose Monitoring: yes  Accucheck frequency: continuous  Fasting glucose: 115, 90s  Post prandial: 280 yesterday, usually 180-185 Taking Insulin ?: yes  Long acting insulin : tresiba  30-40 units  Blood Pressure Monitoring: rarely Retinal Examination: Up to Date Foot Exam: Up to Date Diabetic Education: Completed Pneumovax: Up to Date Influenza: Up to Date Aspirin : yes  HYPOTHYROIDISM Thyroid  control status:uncontrolled Satisfied with current treatment? yes Medication side effects: no Medication compliance: excellent compliance Recent dose adjustment:yes Fatigue: yes Cold intolerance: no Heat intolerance: no Weight gain: no Weight loss: no Constipation: no Diarrhea/loose stools: no Palpitations: no Lower extremity edema: no Anxiety/depressed mood: yes  Has been feeling down since the stroke. Can't drive anymore. Not feeling like himself.  Relevant past medical, surgical, family and social history reviewed and updated as indicated. Interim medical history since our last visit reviewed. Allergies and medications reviewed and updated.  Review of Systems  Constitutional: Negative.   Respiratory: Negative.    Cardiovascular: Negative.   Musculoskeletal: Negative.   Neurological: Negative.   Psychiatric/Behavioral: Negative.      Per HPI unless specifically indicated above     Objective:    BP 133/68   Pulse 65   Ht 6' 1 (1.854 m)    Wt 249 lb 3.2 oz (113 kg)   SpO2 95%   BMI 32.88 kg/m   Wt Readings from Last 3 Encounters:  10/15/24 249 lb 3.2 oz (113 kg)  09/03/24 251 lb (113.9 kg)  08/27/24 247 lb (112 kg)    Physical Exam Vitals and nursing note reviewed.  Constitutional:      General: He is not in acute distress.    Appearance: Normal appearance. He is obese. He is not ill-appearing, toxic-appearing or diaphoretic.  HENT:     Head: Normocephalic and atraumatic.     Right Ear: External ear normal.     Left Ear: External ear normal.     Nose: Nose normal.     Mouth/Throat:     Mouth: Mucous membranes are moist.     Pharynx: Oropharynx is clear.  Eyes:     General: No scleral icterus.       Right eye: No discharge.        Left eye: No discharge.     Extraocular Movements: Extraocular movements intact.     Conjunctiva/sclera: Conjunctivae normal.     Pupils: Pupils are equal, round, and reactive to light.  Cardiovascular:     Rate and Rhythm: Normal rate and regular rhythm.     Pulses: Normal pulses.     Heart sounds: Normal heart sounds. No murmur heard.    No friction rub. No gallop.  Pulmonary:     Effort: Pulmonary effort is normal. No respiratory distress.     Breath sounds: Normal breath sounds. No stridor. No wheezing, rhonchi or rales.  Chest:     Chest wall: No tenderness.  Musculoskeletal:        General: Normal range of motion.     Cervical back: Normal range of motion and neck supple.  Skin:    General: Skin is warm and dry.     Capillary Refill: Capillary refill takes less than 2 seconds.     Coloration: Skin is not jaundiced or pale.     Findings: No bruising, erythema, lesion or rash.  Neurological:     General: No focal deficit present.     Mental Status: He is alert and oriented to person, place, and time. Mental status is at baseline.  Psychiatric:        Mood and Affect: Mood normal.        Behavior: Behavior normal.        Thought Content: Thought content normal.         Judgment: Judgment normal.     Results for orders placed or performed in visit on 08/27/24  Microalbumin, Urine Waived   Collection Time: 08/27/24  8:53 AM  Result Value Ref Range   Microalb, Ur Waived 150 (H) 0 - 19 mg/L   Creatinine, Urine Waived 100 10 - 300 mg/dL   Microalb/Creat Ratio >300 (H) <30 mg/g  Bayer DCA Hb A1c Waived   Collection Time: 08/27/24  8:53 AM  Result Value Ref Range   HB A1C (BAYER DCA - WAIVED) 6.5 (H) 4.8 - 5.6 %  Comprehensive metabolic panel with GFR   Collection Time: 08/27/24  9:02 AM  Result Value Ref Range   Glucose 51 (L) 70 - 99 mg/dL   BUN 20 8 - 27 mg/dL   Creatinine, Ser 8.70 (H) 0.76 - 1.27 mg/dL   eGFR 61 >40 fO/fpw/8.26   BUN/Creatinine Ratio 16 10 - 24   Sodium 143 134 - 144 mmol/L   Potassium 3.7 3.5 - 5.2 mmol/L   Chloride 104 96 - 106 mmol/L   CO2 26 20 - 29 mmol/L   Calcium  10.2 8.6 - 10.2 mg/dL   Total Protein 7.1 6.0 - 8.5 g/dL   Albumin 4.5 3.9 - 4.9 g/dL   Globulin, Total 2.6 1.5 - 4.5 g/dL   Bilirubin Total 0.4 0.0 - 1.2 mg/dL   Alkaline Phosphatase 48 44 - 121 IU/L   AST 17 0 - 40 IU/L   ALT 15 0 - 44 IU/L  CBC with Differential/Platelet   Collection Time: 08/27/24  9:02 AM  Result Value Ref Range   WBC 7.6 3.4 - 10.8 x10E3/uL   RBC 4.29 4.14 - 5.80 x10E6/uL   Hemoglobin 12.8 (L) 13.0 - 17.7 g/dL   Hematocrit 61.3 62.4 - 51.0 %   MCV 90 79 - 97 fL   MCH 29.8 26.6 - 33.0 pg   MCHC 33.2 31.5 - 35.7 g/dL   RDW 87.1 88.3 - 84.5 %   Platelets 202 150 - 450 x10E3/uL   Neutrophils 64 Not Estab. %   Lymphs 18 Not Estab. %   Monocytes 12 Not Estab. %   Eos 5 Not Estab. %   Basos 1 Not Estab. %   Neutrophils Absolute 4.9 1.4 - 7.0 x10E3/uL   Lymphocytes Absolute 1.4 0.7 - 3.1 x10E3/uL   Monocytes Absolute 0.9 0.1 - 0.9 x10E3/uL   EOS (ABSOLUTE) 0.4 0.0 - 0.4 x10E3/uL   Basophils Absolute 0.1 0.0 - 0.2 x10E3/uL   Immature Granulocytes 0 Not Estab. %   Immature Grans (Abs) 0.0 0.0 -  0.1 x10E3/uL  Lipid Panel w/o  Chol/HDL Ratio   Collection Time: 08/27/24  9:02 AM  Result Value Ref Range   Cholesterol, Total 142 100 - 199 mg/dL   Triglycerides 807 (H) 0 - 149 mg/dL   HDL 42 >60 mg/dL   VLDL Cholesterol Cal 32 5 - 40 mg/dL   LDL Chol Calc (NIH) 68 0 - 99 mg/dL  PSA   Collection Time: 08/27/24  9:02 AM  Result Value Ref Range   Prostate Specific Ag, Serum 1.9 0.0 - 4.0 ng/mL  TSH   Collection Time: 08/27/24  9:02 AM  Result Value Ref Range   TSH 0.042 (L) 0.450 - 4.500 uIU/mL  Testosterone , free, total([facility])   Collection Time: 08/27/24  9:02 AM  Result Value Ref Range   Testosterone  420 264 - 916 ng/dL   Testosterone , Free 3.0 (L) 6.6 - 18.1 pg/mL   Sex Hormone Binding 63.6 19.3 - 76.4 nmol/L  B12   Collection Time: 08/27/24  9:02 AM  Result Value Ref Range   Vitamin B-12 587 232 - 1,245 pg/mL  VITAMIN D  25 Hydroxy (Vit-D Deficiency, Fractures)   Collection Time: 08/27/24  9:02 AM  Result Value Ref Range   Vit D, 25-Hydroxy 31.3 30.0 - 100.0 ng/mL      Assessment & Plan:   Problem List Items Addressed This Visit       Endocrine   Diabetes 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 Continue to monitor. Recheck A1c in 6 weeks.       Hypothyroidism - Primary   Rechecking labs today. Await results. Treat as needed.       Relevant Orders   TSH     Other   History of stroke with residual deficit   Continue to follow with cardiology. Stable. Having issues with change in function- referral to social work placed today.       Relevant Orders   AMB Referral VBCI Care Management   Other Visit Diagnoses       Adjustment disorder with depressed mood       Not doing well since stroke. Discussed starting new activity like the [employer] and will get social work involved. Call with any concerns.   Relevant Orders   AMB Referral VBCI Care Management        Follow up plan: Return in about 6 weeks (around  11/26/2024).

## 2024-10-15 NOTE — Assessment & Plan Note (Signed)
 Sugars are doing pretty well. Continue to monitor. Recheck A1c in 6 weeks.

## 2024-10-15 NOTE — Progress Notes (Signed)
   10/15/2024  Patient ID: Manuel Burnett, male   DOB: 08/26/1956, 68 y.o.   MRN: [id number]  Patient presenting to [facility] for assistance with linking Libre 3 data to [facility] [employer].  Account has been linked so practice can see Mr. [name] information.  Libre 3 Data 10/15-1028: Time Active- 96% Avg Glucose- 150 GMI- 6.9% Variability- 28.6% Time in Range (70-180)- 77% High- 22% Very High- 1% Patterns Noted- highs in afternoon/evening likely related to lunch at supper  -Advised patient to be mindful of correlation between meal choices and elevated readings to help with dietary selections to improve glucose control.  Telephone follow-up visit scheduled for 12/15.  [name], PharmD, DPLA

## 2024-10-15 NOTE — Assessment & Plan Note (Signed)
 Rechecking labs today. Await results. Treat as needed.

## 2024-10-15 NOTE — Assessment & Plan Note (Signed)
 Continue to follow with cardiology. Stable. Having issues with change in function- referral to social work placed today.

## 2024-10-16 ENCOUNTER — Telehealth: Payer: Self-pay

## 2024-10-16 LAB — TSH: TSH: 0.032 u[IU]/mL — ABNORMAL LOW (ref 0.450–4.500)

## 2024-10-16 NOTE — Progress Notes (Signed)
 Complex Care Management Note  Care Guide Note 10/16/2024 Name: Manuel Burnett MRN: [id number] DOB: 12-31-1955  Manuel Burnett is a 68 y.o. year old male who sees Vicci Duwaine SQUIBB, DO for primary care. I reached out to Manuel Burnett by phone today to offer complex care management services.  [name] was given information about Complex Care Management services today including:   The Complex Care Management services include support from the care team which includes your Nurse Care Manager, Clinical Social Worker, or Pharmacist.  The Complex Care Management team is here to help remove barriers to the health concerns and goals most important to you. Complex Care Management services are voluntary, and the patient may decline or stop services at any time by request to their care team member.   Complex Care Management Consent Status: Patient agreed to services and verbal consent obtained.   Follow up plan:  Telephone appointment with complex care management team member scheduled for:  11/11/2024  Encounter Outcome:  Patient Scheduled  Jeoffrey Buffalo , RMA     [place]  [facility] Guide  Direct Dial: [phone]  Website: [email or web address]

## 2024-10-16 NOTE — Telephone Encounter (Signed)
 Copied from CRM [id number]. Topic: Clinical - Medical Advice >> Oct 16, 2024 11:04 AM Manuel Burnett wrote: Reason for CRM: The patient would like to be contacted by their PCP when possible regarding their disability claim and the process for initiating it. The patient shares that it's related to their stroke and eyesight concerns

## 2024-10-18 ENCOUNTER — Ambulatory Visit: Payer: Self-pay | Admitting: Family Medicine

## 2024-10-18 MED ORDER — LEVOTHYROXINE SODIUM 25 MCG PO TABS: 25.0000 ug | ORAL_TABLET | Freq: Every day | ORAL | 0 refills | Status: DC

## 2024-10-18 MED ORDER — LEVOTHYROXINE SODIUM 200 MCG PO TABS: 200.0000 ug | ORAL_TABLET | Freq: Every day | ORAL | 0 refills | Status: DC

## 2024-10-25 NOTE — Telephone Encounter (Signed)
 Spoke with patient.  He had questions about if he would qualify for disability. I informed patient that we typically handle STD and or FMLA. I referred him to his local social services office. He verbalized understanding and did not have any further questions. Informed patient to call back if he has any additional question or concerns.

## 2024-10-26 ENCOUNTER — Other Ambulatory Visit: Payer: Self-pay | Admitting: Family Medicine

## 2024-10-28 NOTE — Telephone Encounter (Signed)
 Requested medication (s) are due for refill today - provider review   Requested medication (s) are on the active medication list -yes  Future visit scheduled -yes  Last refill: 05/10/24 #12 1RF  Notes to clinic: high dose Rx- requires provider review   Requested Prescriptions  Pending Prescriptions Disp Refills   Vitamin D , Ergocalciferol , (DRISDOL ) 1.25 MG (50000 UNIT) CAPS capsule [Pharmacy Med Name: VITAMIN D2 50,000IU (ERGO) CAP RX] 12 capsule 1    Sig: TAKE 1 CAPSULE BY MOUTH EVERY 7 DAYS     Endocrinology:  Vitamins - Vitamin D  Supplementation 2 Failed - 10/28/2024  4:10 PM      Failed - Manual Review: Route requests for 50,000 IU strength to the provider      Passed - Ca in normal range and within 360 days    Calcium   Date Value Ref Range Status  08/27/2024 10.2 8.6 - 10.2 mg/dL Final   Calcium , Total  Date Value Ref Range Status  11/08/2014 9.0 8.5 - 10.1 mg/dL Final         Passed - Vitamin D  in normal range and within 360 days    Vit D, 25-Hydroxy  Date Value Ref Range Status  08/27/2024 31.3 30.0 - 100.0 ng/mL Final    Comment:    Vitamin D  deficiency has been defined by the Institute of Medicine and an Endocrine Society practice guideline as a level of serum 25-OH vitamin D  less than 20 ng/mL (1,2). The Endocrine Society went on to further define vitamin D  insufficiency as a level between 21 and 29 ng/mL (2). 1. IOM (Institute of Medicine). 2010. Dietary reference    intakes for calcium  and D. [place]: The    [employer]. 2. Holick MF, Binkley Banks, Bischoff-Ferrari HA, et al.    Evaluation, treatment, and prevention of vitamin D     deficiency: an Endocrine Society clinical practice    guideline. JCEM. 2011 Jul; 96(7):1911-30.          Passed - Valid encounter within last 12 months    Recent Outpatient Visits           1 week ago Hypothyroidism, unspecified type   [place] [facility] [place], Megan P, DO   1 month ago  Hypoglycemia   [place] [facility] [place], [place] P, DO   2 months ago Benign hypertensive renal disease   [place] [facility] [place], Megan P, DO   4 months ago Benign hypertensive renal disease   [place] [facility] [place], Megan P, DO   5 months ago Benign hypertensive renal disease   [place] [facility] [place], Megan P, DO                 Requested Prescriptions  Pending Prescriptions Disp Refills   Vitamin D , Ergocalciferol , (DRISDOL ) 1.25 MG (50000 UNIT) CAPS capsule [Pharmacy Med Name: VITAMIN D2 50,000IU (ERGO) CAP RX] 12 capsule 1    Sig: TAKE 1 CAPSULE BY MOUTH EVERY 7 DAYS     Endocrinology:  Vitamins - Vitamin D  Supplementation 2 Failed - 10/28/2024  4:10 PM      Failed - Manual Review: Route requests for 50,000 IU strength to the provider      Passed - Ca in normal range and within 360 days    Calcium   Date Value Ref Range Status  08/27/2024 10.2 8.6 - 10.2 mg/dL Final   Calcium , Total  Date Value Ref Range Status  11/08/2014 9.0 8.5 - 10.1 mg/dL Final         Passed - Vitamin D  in normal range and within 360 days    Vit D, 25-Hydroxy  Date Value Ref Range Status  08/27/2024 31.3 30.0 - 100.0 ng/mL Final    Comment:    Vitamin D  deficiency has been defined by the Institute of Medicine and an Endocrine Society practice guideline as a level of serum 25-OH vitamin D  less than 20 ng/mL (1,2). The Endocrine Society went on to further define vitamin D  insufficiency as a level between 21 and 29 ng/mL (2). 1. IOM (Institute of Medicine). 2010. Dietary reference    intakes for calcium  and D. [place]: The    [employer]. 2. Holick MF, Binkley [place], Bischoff-Ferrari HA, et al.    Evaluation, treatment, and prevention of vitamin D     deficiency: an Endocrine Society clinical practice    guideline. JCEM. 2011 Jul; 96(7):1911-30.          Passed - Valid encounter within last  12 months    Recent Outpatient Visits           1 week ago Hypothyroidism, unspecified type   [place] [facility] [place], Megan P, DO   1 month ago Hypoglycemia   [place] [facility] [place], [place] P, DO   2 months ago Benign hypertensive renal disease   [place] [facility] [place], Megan P, DO   4 months ago Benign hypertensive renal disease   [place] [facility] [place], Megan P, DO   5 months ago Benign hypertensive renal disease   [place] [facility] [place], [place], DO

## 2024-11-02 ENCOUNTER — Other Ambulatory Visit: Payer: Self-pay | Admitting: Family Medicine

## 2024-11-05 NOTE — Telephone Encounter (Signed)
 Too early, refilled 08/27/24 # 90 with 1 refill. Requested Prescriptions  Refused Prescriptions Disp Refills   pioglitazone  (ACTOS ) 30 MG tablet [Pharmacy Med Name: PIOGLITAZONE  30MG  TABLETS] 90 tablet 1    Sig: TAKE 1 TABLET(30 MG) BY MOUTH DAILY     Endocrinology:  Diabetes - Glitazones - pioglitazone  Passed - 11/05/2024 11:45 AM      Passed - HBA1C is between 0 and 7.9 and within 180 days    Hemoglobin A1C  Date Value Ref Range Status  04/27/2024 8.1  Final   HB A1C (BAYER DCA - WAIVED)  Date Value Ref Range Status  08/27/2024 6.5 (H) 4.8 - 5.6 % Final    Comment:             Prediabetes: 5.7 - 6.4          Diabetes: >6.4          Glycemic control for adults with diabetes: <7.0          Passed - Valid encounter within last 6 months    Recent Outpatient Visits           3 weeks ago Hypothyroidism, unspecified type   [place] [facility] [place], Megan P, DO   2 months ago Hypoglycemia   [place] [facility] [place], Megan P, DO   2 months ago Benign hypertensive renal disease   [place] [facility] [place], Megan P, DO   4 months ago Benign hypertensive renal disease   [place] [facility] [place], Megan P, DO   5 months ago Benign hypertensive renal disease    [facility] [place], [place], DO

## 2024-11-07 ENCOUNTER — Telehealth: Payer: Self-pay

## 2024-11-07 NOTE — Progress Notes (Signed)
   11/07/2024  Patient ID: Manuel Burnett, male   DOB: Oct 12, 1956, 68 y.o.   MRN: [id number]  Returning missed call/voicemail from patient.  States diabetes is well controlled at this time with FBG of 95 this morning, and BG was even 95 before bed last night, so he had not taken any insulin .  Patient is inquiring about safety/efficacy of Rugiet for ED.  He states he has tried other treatments, and they did not work and made him feel like he was going to have a heart attack.  Advised patient I will look into this medication, and we have a telephone visit scheduled Monday morning to discuss.  [name], PharmD, DPLA

## 2024-11-11 ENCOUNTER — Telehealth: Admitting: *Deleted

## 2024-11-11 ENCOUNTER — Other Ambulatory Visit: Payer: Self-pay | Admitting: Licensed Clinical Social Worker

## 2024-11-11 ENCOUNTER — Other Ambulatory Visit: Payer: Self-pay

## 2024-11-11 DIAGNOSIS — N529 Male erectile dysfunction, unspecified: Secondary | ICD-10-CM

## 2024-11-11 DIAGNOSIS — Z794 Long term (current) use of insulin: Secondary | ICD-10-CM

## 2024-11-11 NOTE — Progress Notes (Signed)
 11/11/24 Name: Manuel Burnett MRN: [id number] DOB: 09-Apr-1956  Manuel Burnett is a 68 y.o. year old male who was referred for medication management by their primary care provider, Vicci Bouchard P, DO. They presented for a telephone follow-up visit.   They were referred to the pharmacist by their PCP for assistance in managing diabetes   Subjective:  Care Team: Primary Care Provider: Vicci Bouchard SQUIBB, DO ; Next Scheduled Visit: 11/26/24  Medication Access/Adherence  Current Pharmacy:  [id number] [place], [place] - [address] AT [facility] [address] [place] [address] [place] [address] Phone: [phone] Fax: [phone]  [facility] [id number] [place], [place] - [address] [place] [address] Phone: [phone] Fax: [phone]  [facility] - [facility] [address] [place] [address] Phone: [phone] Fax: [phone]  Patient reports affordability concerns with their medications: No  Patient reports access/transportation concerns to their pharmacy: No  Patient reports adherence concerns with their medications:  No    Diabetes: Current medications: metformin  XR 1000mg  BID, Tresiba  30-40 units at bedtime, pioglitazone  30mg  daily -Medications tried in the past: glipizide  recently stopped due to hypoglycemia, did not tolerate semaglutide  or dulaglutide  in the past.  Prescribed Jardiance  in the past but notes state medication was not affordable. -Patient states he is almost out of pioglitazone  30mg , but he does not wish to continue this medication after reading about potential side effects (CHF).  He does endorse some swelling in the feet an ankles, as well as weight gain (just had to purchase pants with 2 in larger waist) -Patient is using 30-40 units of Tresiba  at bedtime based on blood glucose- there have been several nights that BG is <100 and he will not use insulin  and  still wake up with FBG around 95 -Does not endorse any recent hypoglycemia -Using Libre 3+ for CGM; data 11/11-11/24 Time CGM Active 97% Average Glucose 129 GMI 6.4% Glucose Variability 32.6% TIR:  87% Very High 1%, High 9%, Low 3%  Macrovascular and Microvascular Risk Reduction:  Statin? yes (atorvastatin  80mg  daily ); ACEi/ARB? yes (losartan  100mg  daily) Last urinary albumin/creatinine ratio:  Lab Results  Component Value Date   MICRALBCREAT >300 (H) 08/27/2024   MICRALBCREAT >300 (H) 04/21/2023   MICRALBCREAT >300 (H) 12/05/2022   MICRALBCREAT >300 (H) 11/10/2021   MICRALBCREAT 30-300 (H) 06/15/2020   MICRALBCREAT 30-300 (H) 08/08/2019   MICRALBCREAT 30-300 (H) 07/09/2018   MICRALBCREAT <30 04/03/2017   MICRALBCREAT 30-300 (H) 08/08/2016   MICRALBCREAT <30 02/02/2016   MICRALBCREAT 30-300 (H) 08/03/2015   Last eye exam:  Lab Results  Component Value Date   HMDIABEYEEXA No Retinopathy 05/09/2024   Last foot exam: 06/17/2024 Tobacco Use:  Tobacco Use: Medium Risk (10/15/2024)   Patient History    Smoking Tobacco Use: Former    Smokeless Tobacco Use: Never    Passive Exposure: Not on file   Erectile Dysfunction: -Advised patient that medications through [facility] for ED all contain either sildenafil  or tadalafil plus another medication or supplement -Patient used Viagra  100mg  in the past and states it ws not effective for ED and made his heart race -States he was prescribed Levitra a long time ago, and this was effective with no ADE's  Objective: Lab Results  Component Value Date   HGBA1C 6.5 (H) 08/27/2024   Lab Results  Component Value Date   CREATININE 1.29 (H) 08/27/2024  BUN 20 08/27/2024   NA 143 08/27/2024   K 3.7 08/27/2024   CL 104 08/27/2024   CO2 26 08/27/2024   Lab Results  Component Value Date   CHOL 142 08/27/2024   HDL 42 08/27/2024   LDLCALC 68 08/27/2024   TRIG 192 (H) 08/27/2024   Assessment/Plan:   Diabetes: -Currently  controlled; goal A1c <7%. Cardiorenal risk reduction is opportunities for improvement.. Blood pressure is typically at goal <130/80. LDL is at goal <70.  -Patient will stop pioglitazone  30mg  daily -Continue metformin  1000mg  BID and Tresiba  30-40 units at bedtime -Continue Libre 3+ for CGM -Consider SGLT2 in the future based on recent UACR; this could also help with blood pressure control, and we could see if patient would qualify for PAP.    Erectile Dysfunction: -I recommend a retrial of sildenafil  at 25mg  dosing, using 1 tablet 30 minutes to 1 hour prior [employer] requires a PA, so I have submitted a request via CoverMyMeds (KEY:  [id number])  Follow Up Plan:  Will notify patient/provider with outcome of sildenafil  PA and schedule 4 week telephone follow-up on diabetes management  [name], PharmD, DPLA

## 2024-11-11 NOTE — Patient Outreach (Signed)
 LCSW spoke to patient on the phone for scheduled appointment. Patient stated he is doing well. LCSW and patient discussed referral.Patient stated he had a question about applying for SSDI. LCSW informed patient he can apply online our in person. LCSW informed patient that [place] is the closet office to him. Patient thanked LCSW for this information. LCSW inquired if patient was interested in therapy. Patient stated he is not sure at this time. LCSW offered to call patient back at later time but patient stated he will reach out to LCSW when ready.   Cena Ligas, LCSW Clinical Social Worker [facility]

## 2024-11-11 NOTE — Patient Instructions (Signed)
 Visit Information  Thank you for taking time to visit with me today. Please don't hesitate to contact me if I can be of assistance to you in the future.   Please call the care guide team at [phone] if you need to cancel, schedule, or reschedule an appointment.   Please call 911 if you are experiencing a Mental Health or Behavioral Health Crisis or need someone to talk to.  Cena Ligas, LCSW Clinical Social Worker [facility]

## 2024-11-13 NOTE — Progress Notes (Unsigned)
   11/13/2024  Patient ID: Manuel Burnett, male   DOB: 06/15/1956, 68 y.o.   MRN: [id number]  Prior authorization for sildenafil  was denied; Medicare will not pay for any ED medications.  With GoodRx coupon, 30 sildenafil  20mg  tablets would be approximately $10.  I recommend trial of sildenafil  20mg  taking 2-3 tablets 30 minutes to 1 hour prior to sexual activity to see if patient tolerates this better than 100mg  dose he was previously on.  Order pending for PCP to sign if in agreement.  Patient sees PCP again 12/9, and I will schedule a follow-up with him in 4 weeks.  [name], PharmD, DPLA

## 2024-11-18 ENCOUNTER — Other Ambulatory Visit: Payer: Self-pay

## 2024-11-18 DIAGNOSIS — Z794 Long term (current) use of insulin: Secondary | ICD-10-CM

## 2024-11-18 DIAGNOSIS — N529 Male erectile dysfunction, unspecified: Secondary | ICD-10-CM

## 2024-11-18 MED ORDER — SILDENAFIL CITRATE 20 MG PO TABS: ORAL_TABLET | ORAL | 0 refills | Status: AC

## 2024-11-18 NOTE — Progress Notes (Signed)
   11/18/2024  Patient ID: Manuel Burnett, male   DOB: 1956/05/15, 68 y.o.   MRN: [id number]  Returning missed call/voicemail from patient.  He recently stopped pioglitazone  30mg  daily after reading about potential side effects (CHF) and noting he had some swelling in the feet an ankles, as well as weight gain (just had to purchase pants with 2 in larger waist).  Since stopping this medication, BG has elevated some; so he has started taking glipizide  5mg  daily with breakfast again.  He continues to use metformin  XR 1000mg  BID and  Tresiba  30-40 units at bedtime based on his BG readings, and does not endorse any s/sx of hypoglycemia.  He will continue this regimen until his upcoming visit with Dr. Vicci on 12/9.  Could consider SGLT2 in the future based on most recent UACR; this could also help with blood pressure control, and we could see if patient would qualify for PAP.   PA recently denied for sildenafil , because insurance will not cover ED medications; however 20mg  tablets would be covered with GoodRx at approximately $15 for #30.  Patient would like to try this taking 2-3 thirty minutes to 1 hour prior to sexual activity.  Order pending for PCP to sign if in agreement.  Telephone follow-up visit scheduled for 1/6.  Manuel Burnett, PharmD, DPLA

## 2024-11-19 ENCOUNTER — Telehealth: Payer: Self-pay

## 2024-11-19 NOTE — Progress Notes (Signed)
 Complex Care Management Care Guide Note  11/19/2024 Name: HEITOR STEINHOFF MRN: [id number] DOB: 02-Dec-1956  UNIQUE SEARFOSS is a 68 y.o. year old male who is a primary care patient of Vicci Duwaine SQUIBB, DO and is actively engaged with the care management team. I reached out to Donnice LELON Ehlers by phone today to assist with re-scheduling  with the Licensed Clinical Child Psychotherapist.  Follow up plan: Patient Declines rescheduling   Jeoffrey Buffalo , RMA     [facility]  [facility], [facility] Guide  Direct Dial: [phone]  Website: [email or web address]

## 2024-11-25 ENCOUNTER — Telehealth: Payer: Self-pay

## 2024-11-25 NOTE — Progress Notes (Signed)
   11/25/2024  Patient ID: Manuel Burnett, male   DOB: 10/17/1956, 68 y.o.   MRN: [id number]  Returning missed call/voicemail from patient in regard to sildenafil  prescription.  Order did not go through to the pharmacy, so I called and gave a verbal order for sildenafil  20mg  tablets to take 2-3 by mouth 30 minutes to 1 hour prior to sexual activity #30 with no additional refills.  Contacted patient to let him know pharmacy should be working on this, and I am sending a Clinical Cytogeneticist message with GoodRx coupon information to provide to the pharmacy to help with cost since not covered by insurance.    Manuel Burnett, PharmD, DPLA

## 2024-11-26 ENCOUNTER — Encounter: Payer: Self-pay | Admitting: Family Medicine

## 2024-11-26 ENCOUNTER — Ambulatory Visit: Admitting: Family Medicine

## 2024-11-26 VITALS — BP 162/70 | HR 69

## 2024-11-26 DIAGNOSIS — E1140 Type 2 diabetes mellitus with diabetic neuropathy, unspecified: Secondary | ICD-10-CM

## 2024-11-26 DIAGNOSIS — E039 Hypothyroidism, unspecified: Secondary | ICD-10-CM

## 2024-11-26 DIAGNOSIS — I129 Hypertensive chronic kidney disease with stage 1 through stage 4 chronic kidney disease, or unspecified chronic kidney disease: Secondary | ICD-10-CM

## 2024-11-26 NOTE — Assessment & Plan Note (Signed)
Doing great with A1c of 6.5. Continue current regimen. Continue to monitor. Call with any concerns.  ?

## 2024-11-26 NOTE — Assessment & Plan Note (Signed)
 Rechecking labs today. Await results. Treat as needed.

## 2024-11-26 NOTE — Progress Notes (Signed)
 BP (!) 162/70   Pulse 69   SpO2 95%    Subjective:    Patient ID: Manuel Burnett, male    DOB: [birth year], 68 y.o.   MRN: [id number]  HPI: Manuel Burnett is a 68 y.o. male  Chief Complaint  Patient presents with   Diabetes   DIABETES- stopped the actos  about 2.5 weeks ago, sugars have been up slightly Hypoglycemic episodes:no Polydipsia/polyuria: no Visual disturbance: no Chest pain: no Paresthesias: yes Glucose Monitoring: yes  Accucheck frequency: continuous Taking Insulin ?: yes Blood Pressure Monitoring: rarely Retinal Examination: Up to Date Foot Exam: Up to Date Diabetic Education: Completed Pneumovax: Up to Date Influenza: Up to Date Aspirin : yes  HYPERTENSION  Hypertension status: uncontrolled  Satisfied with current treatment? yes Duration of hypertension: chronic BP monitoring frequency:  a few times a week BP medication side effects:  no Medication compliance: excellent compliance Aspirin : yes Recurrent headaches: no Visual changes: yes Palpitations: no Dyspnea: no Chest pain: no Lower extremity edema: no Dizzy/lightheaded: no  HYPOTHYROIDISM Thyroid  control status:unsure Satisfied with current treatment? yes Medication side effects: no Medication compliance: excellent compliance Recent dose adjustment:yes Fatigue: no Cold intolerance: no Heat intolerance: no Weight gain: no Weight loss: no Constipation: no Diarrhea/loose stools: no Palpitations: no Lower extremity edema: no Anxiety/depressed mood: no   Relevant past medical, surgical, family and social history reviewed and updated as indicated. Interim medical history since our last visit reviewed. Allergies and medications reviewed and updated.  Review of Systems  Constitutional: Negative.   Respiratory: Negative.    Cardiovascular: Negative.   Gastrointestinal: Negative.   Musculoskeletal: Negative.   Neurological: Negative.   Psychiatric/Behavioral: Negative.      Per  HPI unless specifically indicated above     Objective:    BP (!) 162/70   Pulse 69   SpO2 95%   Wt Readings from Last 3 Encounters:  10/15/24 249 lb 3.2 oz (113 kg)  09/03/24 251 lb (113.9 kg)  08/27/24 247 lb (112 kg)    Physical Exam Vitals and nursing note reviewed.  Constitutional:      General: He is not in acute distress.    Appearance: Normal appearance. He is not ill-appearing, toxic-appearing or diaphoretic.  HENT:     Head: Normocephalic and atraumatic.     Right Ear: External ear normal.     Left Ear: External ear normal.     Nose: Nose normal.     Mouth/Throat:     Mouth: Mucous membranes are moist.     Pharynx: Oropharynx is clear.  Eyes:     General: No scleral icterus.       Right eye: No discharge.        Left eye: No discharge.     Extraocular Movements: Extraocular movements intact.     Conjunctiva/sclera: Conjunctivae normal.     Pupils: Pupils are equal, round, and reactive to light.  Cardiovascular:     Rate and Rhythm: Normal rate and regular rhythm.     Pulses: Normal pulses.     Heart sounds: Normal heart sounds. No murmur heard.    No friction rub. No gallop.  Pulmonary:     Effort: Pulmonary effort is normal. No respiratory distress.     Breath sounds: Normal breath sounds. No stridor. No wheezing, rhonchi or rales.  Chest:     Chest wall: No tenderness.  Musculoskeletal:        General: Normal range of motion.     Cervical back:  Normal range of motion and neck supple.  Skin:    General: Skin is warm and dry.     Capillary Refill: Capillary refill takes less than 2 seconds.     Coloration: Skin is not jaundiced or pale.     Findings: No bruising, erythema, lesion or rash.  Neurological:     General: No focal deficit present.     Mental Status: He is alert and oriented to person, place, and time. Mental status is at baseline.  Psychiatric:        Mood and Affect: Mood normal.        Behavior: Behavior normal.        Thought Content:  Thought content normal.        Judgment: Judgment normal.     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 Continue current regimen. Continue to monitor. Call with any concerns.       Hypothyroidism   Rechecking labs today. Await results. Treat as needed.       Relevant Orders   TSH     Genitourinary   Benign hypertensive renal disease   Running high. Will monitor at home. Continue to monitor. Call with any concerns. If continuing to stay high- will adjust medication.       Relevant Orders   Basic metabolic panel with GFR     Follow up plan: Return in about 3 months (around 02/24/2025).

## 2024-11-26 NOTE — Assessment & Plan Note (Signed)
 Running high. Will monitor at home. Continue to monitor. Call with any concerns. If continuing to stay high- will adjust medication.

## 2024-11-27 ENCOUNTER — Telehealth: Payer: Self-pay

## 2024-11-27 LAB — BASIC METABOLIC PANEL WITH GFR
BUN/Creatinine Ratio: 18 (ref 10–24)
BUN: 24 mg/dL (ref 8–27)
CO2: 24 mmol/L (ref 20–29)
Calcium: 9.7 mg/dL (ref 8.6–10.2)
Chloride: 104 mmol/L (ref 96–106)
Creatinine, Ser: 1.36 mg/dL — ABNORMAL HIGH (ref 0.76–1.27)
Glucose: 166 mg/dL — ABNORMAL HIGH (ref 70–99)
Potassium: 3.6 mmol/L (ref 3.5–5.2)
Sodium: 142 mmol/L (ref 134–144)
eGFR: 57 mL/min/1.73 — ABNORMAL LOW (ref >59)

## 2024-11-27 LAB — BAYER DCA HB A1C WAIVED: HB A1C (BAYER DCA - WAIVED): 6.5 % — ABNORMAL HIGH (ref 4.8–5.6)

## 2024-11-27 LAB — TSH: TSH: 0.051 u[IU]/mL — ABNORMAL LOW (ref 0.450–4.500)

## 2024-11-27 NOTE — Progress Notes (Signed)
° °  11/27/2024  Patient ID: Manuel Burnett, male   DOB: 1956-01-22, 68 y.o.   MRN: [id number]  Returning missed call/voicemail from patient in regard to [facility] 3+.  Patient due to change sensors this morning, and when he did the [place] 3 app updated causing a delay in linking new sensor.  This is now working correctly, and I verified data is still come to [employer].  Telephone visit already scheduled to follow-up on diabetes control 1/6.  [name], PharmD, DPLA

## 2024-11-28 ENCOUNTER — Ambulatory Visit: Payer: Self-pay | Admitting: Family Medicine

## 2024-11-28 DIAGNOSIS — E039 Hypothyroidism, unspecified: Secondary | ICD-10-CM

## 2024-11-28 MED ORDER — LEVOTHYROXINE SODIUM 200 MCG PO TABS: 200.0000 ug | ORAL_TABLET | Freq: Every day | ORAL | 0 refills | Status: DC

## 2024-11-28 NOTE — Progress Notes (Signed)
 Called patient and left a message to call back to get scheduled.

## 2024-12-02 ENCOUNTER — Telehealth: Payer: Self-pay | Admitting: Family Medicine

## 2024-12-02 ENCOUNTER — Other Ambulatory Visit

## 2024-12-02 ENCOUNTER — Other Ambulatory Visit: Payer: Self-pay | Admitting: Family Medicine

## 2024-12-02 ENCOUNTER — Other Ambulatory Visit: Payer: Self-pay

## 2024-12-02 NOTE — Telephone Encounter (Unsigned)
 Copied from CRM #[id number]. Topic: Clinical - Medication Refill >> Dec 02, 2024 10:44 AM Amber H wrote: Medication: insulin  degludec (TRESIBA  FLEXTOUCH) 100 UNIT/ML FlexTouch Pen  Has the patient contacted their pharmacy? No, box  states no refill  (Agent: If no, request that the patient contact the pharmacy for the refill. If patient does not wish to contact the pharmacy document the reason why and proceed with request.) (Agent: If yes, when and what did the pharmacy advise?)  This is the patient's preferred pharmacy:  [facility] - [facility] [address] [place] [address] Phone: [phone] Fax: [phone]   Is this the correct pharmacy for this prescription? Yes If no, delete pharmacy and type the correct one.   Has the prescription been filled recently? Yes  Is the patient out of the medication? No, only has 1 more week left.   Has the patient been seen for an appointment in the last year OR does the patient have an upcoming appointment? Yes, last appt was 11/26/2024  Can we respond through MyChart? Phone call- [phone]  Agent: Please be advised that Rx refills may take up to 3 business days. We ask that you follow-up with your pharmacy.

## 2024-12-03 ENCOUNTER — Other Ambulatory Visit: Payer: Self-pay

## 2024-12-03 ENCOUNTER — Telehealth: Payer: Self-pay

## 2024-12-03 MED ORDER — TRESIBA FLEXTOUCH 100 UNIT/ML ~~LOC~~ SOPN
30.0000 [IU] | PEN_INJECTOR | Freq: Every day | SUBCUTANEOUS | 11 refills | Status: DC
  Filled 2024-12-03: qty 30, 75d supply, fill #0

## 2024-12-03 NOTE — Telephone Encounter (Signed)
 Refill for Tresiba  sent to [facility].

## 2024-12-03 NOTE — Progress Notes (Signed)
° °  12/03/2024  Patient ID: Manuel Burnett, male   DOB: 25-Jan-1956, 68 y.o.   MRN: [id number]  Missed call/voicemail from patient stating he is almost out of his Tresiba  and no refills remain at [facility].  Order pending for PCP to sign if in agreement.  He also received notification that [facility] needs additional information to be able to continue to fill and ship his Libre 3+ sensors.  Reorder submitted to [facility] via [employer], and I will monitor status to make sure this gets filled and shipped.  [name], PharmD, DPLA

## 2024-12-04 ENCOUNTER — Other Ambulatory Visit: Payer: Self-pay

## 2024-12-04 NOTE — Progress Notes (Signed)
 Scheduled

## 2024-12-05 ENCOUNTER — Other Ambulatory Visit: Payer: Self-pay | Admitting: Family Medicine

## 2024-12-05 ENCOUNTER — Other Ambulatory Visit: Payer: Self-pay

## 2024-12-05 NOTE — Telephone Encounter (Unsigned)
 Copied from CRM #[id number]. Topic: Clinical - Prescription Issue >> Dec 05, 2024  2:00 PM Ivette P wrote: Reason for CRM: Pt received a call from pharmacy that their tresiba  insulin  is out of stock and what options does the pt have.    insulin  degludec (TRESIBA  FLEXTOUCH) 100 UNIT/ML FlexTouch Pen   Pls follow up with pt before end of day regarding refill of medicaiton.

## 2024-12-06 ENCOUNTER — Other Ambulatory Visit: Payer: Self-pay

## 2024-12-06 ENCOUNTER — Telehealth: Payer: Self-pay

## 2024-12-06 MED ORDER — TRESIBA FLEXTOUCH 100 UNIT/ML ~~LOC~~ SOPN: 30.0000 [IU] | PEN_INJECTOR | Freq: Every day | SUBCUTANEOUS | 1 refills | Status: AC

## 2024-12-06 NOTE — Telephone Encounter (Signed)
Patient was able to get

## 2024-12-06 NOTE — Telephone Encounter (Signed)
 This was called into [facility] in [place] today- Channing says they had it in stock. Was he not able to get it?

## 2024-12-06 NOTE — Progress Notes (Signed)
" ° °  12/06/2024  Patient ID: Manuel Burnett, male   DOB: 07/29/1956, 68 y.o.   MRN: [id number]  In basket message from patient stating [facility] is not able to fill degludec due to backorder; medication is not expected to be available again until the middle of January.  Contacted [facility] where patient fills other medications to see if they have degludec in stock; otherwise, this will need to be changed to Lantus  (other long acting insulin  preferred on patient's plan).  [facility] does have 1 full box in stock, so and order is pending to go there for this fill (1 additional refill added in case [facility] does not get it in within the next month).  Manuel Burnett, PharmD, DPLA  "

## 2024-12-24 ENCOUNTER — Other Ambulatory Visit

## 2024-12-25 ENCOUNTER — Other Ambulatory Visit (INDEPENDENT_AMBULATORY_CARE_PROVIDER_SITE_OTHER): Payer: Self-pay

## 2024-12-25 DIAGNOSIS — E782 Mixed hyperlipidemia: Secondary | ICD-10-CM

## 2024-12-25 DIAGNOSIS — E0840 Diabetes mellitus due to underlying condition with diabetic neuropathy, unspecified: Secondary | ICD-10-CM

## 2024-12-25 DIAGNOSIS — I129 Hypertensive chronic kidney disease with stage 1 through stage 4 chronic kidney disease, or unspecified chronic kidney disease: Secondary | ICD-10-CM

## 2024-12-25 DIAGNOSIS — I2510 Atherosclerotic heart disease of native coronary artery without angina pectoris: Secondary | ICD-10-CM

## 2024-12-25 DIAGNOSIS — Z794 Long term (current) use of insulin: Secondary | ICD-10-CM

## 2024-12-25 NOTE — Progress Notes (Signed)
" ° °  12/25/24 Name: Manuel Burnett MRN: [id number] DOB: [birth year]  Manuel Burnett is a 69 y.o. year old male who was referred for medication management by their primary care provider, Vicci Bouchard P, DO. They presented for a telephone follow-up visit.   They were referred to the pharmacist by their PCP for assistance in managing diabetes   Subjective:  Care Team: Primary Care Provider: Vicci Bouchard SQUIBB, DO ; Next Scheduled Visit: 02/25/25  Diabetes: Current medications: metformin  XR 1000mg  BID, Tresiba  40 units at bedtime, glipzide 5mg  daily -Medications tried in the past: Did not tolerate semaglutide  or dulaglutide  in the past.  Prescribed Jardiance  in the past but notes state medication was not affordable. -Patient recently stopped pioglitazone  30mg  due to risk of CHF and ankle swelling he was experiencing.  Since stopping medication, blood glucose has began trending up -Does not endorse any recent hypoglycemia -Patient states he has gained quite a bit of weight over the past year, and [facility] [employer]; but he does not have consistent transportation and has not really been able to go  -Using [place] 3+ for CGM; data from the past 2 weeks: Time CGM Active 98% Average Glucose 176 GMI 7.5% Glucose Variability 25% TIR:  59% Very High 7%, High 34%, No low or very low  Macrovascular and Microvascular Risk Reduction:  Statin? yes (atorvastatin  80mg  daily ); ACEi/ARB? yes (losartan  100mg  daily) Last urinary albumin/creatinine ratio:  Lab Results  Component Value Date   MICRALBCREAT >300 (H) 08/27/2024   MICRALBCREAT >300 (H) 04/21/2023   MICRALBCREAT >300 (H) 12/05/2022   MICRALBCREAT >300 (H) 11/10/2021   MICRALBCREAT 30-300 (H) 06/15/2020   MICRALBCREAT 30-300 (H) 08/08/2019   MICRALBCREAT 30-300 (H) 07/09/2018   MICRALBCREAT <30 04/03/2017   MICRALBCREAT 30-300 (H) 08/08/2016   MICRALBCREAT <30 02/02/2016   MICRALBCREAT 30-300 (H) 08/03/2015   Last eye exam:  Lab  Results  Component Value Date   HMDIABEYEEXA No Retinopathy 05/09/2024   Last foot exam: 06/17/2024 Tobacco Use:  Tobacco Use: Medium Risk (12/24/2024)   Received from [facility]   Patient History    Smoking Tobacco Use: Former    Smokeless Tobacco Use: Never    Passive Exposure: Not on file   Objective: Lab Results  Component Value Date   HGBA1C 6.5 (H) 11/26/2024   Lab Results  Component Value Date   CREATININE 1.36 (H) 11/26/2024   BUN 24 11/26/2024   NA 142 11/26/2024   K 3.6 11/26/2024   CL 104 11/26/2024   CO2 24 11/26/2024   Lab Results  Component Value Date   CHOL 142 08/27/2024   HDL 42 08/27/2024   LDLCALC 68 08/27/2024   TRIG 192 (H) 08/27/2024   Assessment/Plan:   Diabetes: -Currently controlled; goal A1c <7%. Cardiorenal risk reduction is opportunities for improvement.. Blood pressure is typically at goal <130/80. LDL is at goal <70.  -Based on recently elevated BG readings (and BP readings), along with last elevated UACR, I recommend initiation of Farxiga 10mg  daily- will verify patient would qualify for [place] PAP based on HHI -Continue Tresiba  40 units daily, metformin  1000mg  XR BID -Continue Libre 3+ for CGM -Advised patient to begin monitoring a recording home BP readings for us  to review at next visit -Coordinating with SW to see if there may be transportation resources for [facility] to get [facility]from [facility] to [facility]  Follow Up Plan:  4 weeks  [name], PharmD, DPLA    "

## 2024-12-28 ENCOUNTER — Other Ambulatory Visit: Payer: Self-pay | Admitting: Family Medicine

## 2024-12-30 NOTE — Telephone Encounter (Signed)
Can this be changed to a 90 day supply?

## 2024-12-31 ENCOUNTER — Other Ambulatory Visit: Payer: Self-pay | Admitting: Family Medicine

## 2025-01-02 NOTE — Telephone Encounter (Signed)
 Requested Prescriptions  Pending Prescriptions Disp Refills   EMBECTA PEN NEEDLE ULTRAFINE 31G X 8 MM MISC [Pharmacy Med Name: EMBCT PEN NDL UF 31G X (5/16)] 100 each 0    Sig: USE ONCE DAILY     Endocrinology: Diabetes - Testing Supplies Passed - 01/02/2025  8:20 AM      Passed - Valid encounter within last 12 months    Recent Outpatient Visits           1 month ago Type 2 diabetes mellitus with diabetic neuropathy, with long-term current use of insulin  [facility])   [place] [facility] [place], Megan P, DO   2 months ago Hypothyroidism, unspecified type   [place] [facility] [place], Megan P, DO   4 months ago Hypoglycemia   [place] [facility] [place], Megan P, DO   4 months ago Benign hypertensive renal disease   [place] [facility] [place], Megan P, DO   6 months ago Benign hypertensive renal disease   [place] [facility] [place], [place], DO

## 2025-01-03 ENCOUNTER — Telehealth: Payer: Self-pay

## 2025-01-03 NOTE — Progress Notes (Signed)
" ° °  01/03/2025  Patient ID: Manuel Burnett, male   DOB: 1956-07-09, 69 y.o.   MRN: [id number]  No response received from SW in regard to assistance with transportation for patient to get to and from the [facility] to exercise.  I contact [place] [employer], and they offer door-to-door service at no cost at this time within [place].  Patient wuld need to call to check availability and schedule.  Sending a MyChart message with this information and phone number to call if interested.  [name], PharmD, DPLA    "

## 2025-01-14 ENCOUNTER — Telehealth: Payer: Self-pay | Admitting: Family Medicine

## 2025-01-14 NOTE — Telephone Encounter (Signed)
 Copied from CRM #[id number]. Topic: General - Other >> Jan 13, 2025 11:38 AM Deaijah H wrote: Reason for CRM: Philippe w/ [facility] called in stating they faxed over documents on 01/02/2025 requesting OV information but have not received anything back except 12/21 for DOS of 12/16. Would like to know dates of service after 09/2024. Please call [phone]

## 2025-01-14 NOTE — Telephone Encounter (Signed)
 Notes have been uploaded to [facility] portal

## 2025-01-15 ENCOUNTER — Telehealth: Payer: Self-pay

## 2025-01-15 DIAGNOSIS — Z794 Long term (current) use of insulin: Secondary | ICD-10-CM

## 2025-01-15 MED ORDER — GLIPIZIDE 5 MG PO TABS: 5.0000 mg | ORAL_TABLET | Freq: Two times a day (BID) | ORAL | 0 refills | Status: AC

## 2025-01-15 NOTE — Progress Notes (Signed)
" ° °  01/15/2025  Patient ID: Manuel Burnett, male   DOB: 1956/12/04, 69 y.o.   MRN: [id number]  Returning missed call/voicemail from patient in regard to glipizide  5mg  refill.  Patient recently stopped pioglitazone  30mg  daily based on concern with risk of CHF (patient preference).  After stopping the medication, BG began trending up; so he is now taking glipizide  5mg  BID versus daily, which is helping with BG readings (Lib[employer]View Dashboard shows patient still high 44% of the time).  Pharmacy was not able to refill glipizide , because prescription on file was for once daily dosing, and insurance stated claim too soon to process.  Order pending for glipizide  5mg  BID for PCP to sign if in agreement.  I have a visit with him again next week and plan to look at possibility of starting SGLT2 medication or retrial of GLP1 if affordable or if he would qualify for PAP.  Goal to not need to increase insulin  or glipizide  dosing any further to prevent hypoglycemia risk and weight gain.  Manuel Burnett, PharmD, DPLA  "

## 2025-01-16 ENCOUNTER — Other Ambulatory Visit

## 2025-01-16 DIAGNOSIS — Z122 Encounter for screening for malignant neoplasm of respiratory organs: Secondary | ICD-10-CM

## 2025-01-16 DIAGNOSIS — E039 Hypothyroidism, unspecified: Secondary | ICD-10-CM

## 2025-01-16 DIAGNOSIS — Z87891 Personal history of nicotine dependence: Secondary | ICD-10-CM

## 2025-01-17 ENCOUNTER — Ambulatory Visit: Payer: Self-pay | Admitting: Family Medicine

## 2025-01-17 LAB — TSH: TSH: 1 u[IU]/mL (ref 0.450–4.500)

## 2025-01-17 MED ORDER — LEVOTHYROXINE SODIUM 200 MCG PO TABS: 200.0000 ug | ORAL_TABLET | Freq: Every day | ORAL | 3 refills | Status: AC

## 2025-01-22 ENCOUNTER — Other Ambulatory Visit: Payer: Self-pay

## 2025-01-22 DIAGNOSIS — E782 Mixed hyperlipidemia: Secondary | ICD-10-CM

## 2025-01-22 DIAGNOSIS — Z794 Long term (current) use of insulin: Secondary | ICD-10-CM

## 2025-01-22 DIAGNOSIS — E0840 Diabetes mellitus due to underlying condition with diabetic neuropathy, unspecified: Secondary | ICD-10-CM

## 2025-02-25 ENCOUNTER — Ambulatory Visit: Admitting: Family Medicine

## 2025-03-28 ENCOUNTER — Other Ambulatory Visit

## 2025-07-08 ENCOUNTER — Ambulatory Visit
